# Patient Record
Sex: Male | Born: 1965 | Race: White | Hispanic: No | Marital: Married | Smoking: Current every day smoker
Health system: Southern US, Community
[De-identification: ages and names within clinical notes are randomized; demographics above are authoritative.]

## PROBLEM LIST (undated history)

## (undated) DIAGNOSIS — F4310 Post-traumatic stress disorder, unspecified: Secondary | ICD-10-CM

## (undated) DIAGNOSIS — R51 Headache: Secondary | ICD-10-CM

## (undated) DIAGNOSIS — E039 Hypothyroidism, unspecified: Secondary | ICD-10-CM

## (undated) DIAGNOSIS — I10 Essential (primary) hypertension: Secondary | ICD-10-CM

## (undated) DIAGNOSIS — K219 Gastro-esophageal reflux disease without esophagitis: Secondary | ICD-10-CM

## (undated) DIAGNOSIS — Z9889 Other specified postprocedural states: Secondary | ICD-10-CM

## (undated) DIAGNOSIS — R569 Unspecified convulsions: Secondary | ICD-10-CM

## (undated) DIAGNOSIS — R112 Nausea with vomiting, unspecified: Secondary | ICD-10-CM

## (undated) DIAGNOSIS — R748 Abnormal levels of other serum enzymes: Secondary | ICD-10-CM

## (undated) DIAGNOSIS — F419 Anxiety disorder, unspecified: Secondary | ICD-10-CM

## (undated) HISTORY — DX: Abnormal levels of other serum enzymes: R74.8

---

## 1993-03-14 HISTORY — PX: NECK SURGERY: SHX720

## 1998-03-14 HISTORY — PX: SHOULDER SURGERY: SHX246

## 2006-03-14 HISTORY — PX: DISTAL BICEPS TENDON REPAIR: SHX1461

## 2008-08-27 ENCOUNTER — Emergency Department (HOSPITAL_COMMUNITY): Admission: EM | Admit: 2008-08-27 | Discharge: 2008-08-28 | Payer: Self-pay | Admitting: Emergency Medicine

## 2011-03-24 ENCOUNTER — Encounter (HOSPITAL_COMMUNITY): Payer: Self-pay | Admitting: Pharmacy Technician

## 2011-03-28 ENCOUNTER — Other Ambulatory Visit (HOSPITAL_COMMUNITY): Payer: Self-pay | Admitting: *Deleted

## 2011-03-28 ENCOUNTER — Encounter (HOSPITAL_COMMUNITY): Payer: Self-pay

## 2011-03-28 ENCOUNTER — Encounter (HOSPITAL_COMMUNITY)
Admission: RE | Admit: 2011-03-28 | Discharge: 2011-03-28 | Disposition: A | Discharge status: Home / Self Care | Payer: Worker's Compensation | Source: Ambulatory Visit | Attending: Neurosurgery | Admitting: Neurosurgery

## 2011-03-28 HISTORY — DX: Headache: R51

## 2011-03-28 HISTORY — DX: Gastro-esophageal reflux disease without esophagitis: K21.9

## 2011-03-28 HISTORY — DX: Other specified postprocedural states: Z98.890

## 2011-03-28 HISTORY — DX: Nausea with vomiting, unspecified: R11.2

## 2011-03-28 LAB — SURGICAL PCR SCREEN: Staphylococcus aureus: NEGATIVE

## 2011-03-28 MED ORDER — VANCOMYCIN HCL 500 MG IV SOLR
500.0000 mg | INTRAVENOUS | Status: AC
  Administered 2011-03-29: 500 mg via INTRAVENOUS
  Filled 2011-03-28: qty 500

## 2011-03-28 MED ORDER — TOBRAMYCIN SULFATE 80 MG/2ML IJ SOLN
80.0000 mg | Freq: Once | INTRAVENOUS | Status: AC
  Administered 2011-03-29: 80 mg via INTRAVENOUS
  Filled 2011-03-28: qty 2

## 2011-03-28 NOTE — Pre-Procedure Instructions (Signed)
[address] [facility]  03/28/2011   Your procedure is scheduled on:  Tuesday 03/29/11   Report to [facility] at 100 PM  Call this number if you have problems the morning of surgery: [phone]   Remember:   Do not eat food:After Midnight.  May have clear liquids: up to 4 Hours before arrival.  Clear liquids include soda, tea, black coffee, apple or grape juice, broth.  Take these medicines the morning of surgery with A SIP OF WATER: ZYRTEC, NEURONTIN, PRILOSEC, ROXICODONE    Do not wear jewelry, make-up or nail polish.  Do not wear lotions, powders, or perfumes. You may wear deodorant.  Do not shave 48 hours prior to surgery.  Do not bring valuables to the hospital.  Contacts, dentures or bridgework may not be worn into surgery.  Leave suitcase in the car. After surgery it may be brought to your room.  For patients admitted to the hospital, checkout time is 11:00 AM the day of discharge.   Patients discharged the day of surgery will not be allowed to drive home.  Name and phone number of your driver:   Special Instructions: CHG Shower Use Special Wash: 1/2 bottle night before surgery and 1/2 bottle morning of surgery.   Please read over the following fact sheets that you were given: Pain Booklet, Coughing and Deep Breathing, MRSA Information and Surgical Site Infection Prevention

## 2011-03-29 ENCOUNTER — Ambulatory Visit (HOSPITAL_COMMUNITY)
Admission: RE | Admit: 2011-03-29 | Discharge: 2011-03-30 | Disposition: A | Discharge status: Home / Self Care | Payer: Worker's Compensation | Source: Ambulatory Visit | Attending: Neurosurgery | Admitting: Neurosurgery

## 2011-03-29 ENCOUNTER — Encounter (HOSPITAL_COMMUNITY)
Admission: RE | Disposition: A | Discharge status: Home / Self Care | Payer: Self-pay | Source: Ambulatory Visit | Attending: Neurosurgery

## 2011-03-29 ENCOUNTER — Encounter (HOSPITAL_COMMUNITY): Payer: Self-pay | Admitting: Anesthesiology

## 2011-03-29 ENCOUNTER — Ambulatory Visit (HOSPITAL_COMMUNITY): Payer: Worker's Compensation

## 2011-03-29 ENCOUNTER — Ambulatory Visit (HOSPITAL_COMMUNITY): Payer: Worker's Compensation | Admitting: Anesthesiology

## 2011-03-29 ENCOUNTER — Encounter (HOSPITAL_COMMUNITY): Payer: Self-pay | Admitting: *Deleted

## 2011-03-29 DIAGNOSIS — M5020 Other cervical disc displacement, unspecified cervical region: Secondary | ICD-10-CM

## 2011-03-29 DIAGNOSIS — F17200 Nicotine dependence, unspecified, uncomplicated: Secondary | ICD-10-CM | POA: Insufficient documentation

## 2011-03-29 DIAGNOSIS — K219 Gastro-esophageal reflux disease without esophagitis: Secondary | ICD-10-CM | POA: Insufficient documentation

## 2011-03-29 DIAGNOSIS — R51 Headache: Secondary | ICD-10-CM | POA: Insufficient documentation

## 2011-03-29 HISTORY — PX: ANTERIOR CERVICAL DECOMP/DISCECTOMY FUSION: SHX1161

## 2011-03-29 LAB — HEPATIC FUNCTION PANEL
ALT: 24 U/L (ref 0–53)
Alkaline Phosphatase: 104 U/L (ref 39–117)
Bilirubin, Direct: 0.1 mg/dL (ref 0.0–0.3)
Indirect Bilirubin: 0.4 mg/dL (ref 0.3–0.9)
Total Bilirubin: 0.5 mg/dL (ref 0.3–1.2)

## 2011-03-29 SURGERY — ANTERIOR CERVICAL DECOMPRESSION/DISCECTOMY FUSION 2 LEVELS
Anesthesia: General | Wound class: Clean

## 2011-03-29 MED ORDER — ONDANSETRON HCL 4 MG/2ML IJ SOLN: 4.0000 mg | INTRAMUSCULAR | Status: DC | PRN

## 2011-03-29 MED ORDER — KCL IN DEXTROSE-NACL 20-5-0.45 MEQ/L-%-% IV SOLN
80.0000 mL/h | INTRAVENOUS | Status: DC
  Administered 2011-03-29: 80 mL/h via INTRAVENOUS
  Filled 2011-03-29 (×3): qty 1000

## 2011-03-29 MED ORDER — ONDANSETRON HCL 4 MG/2ML IJ SOLN
INTRAMUSCULAR | Status: DC | PRN
  Administered 2011-03-29: 4 mg via INTRAVENOUS

## 2011-03-29 MED ORDER — OXYCODONE-ACETAMINOPHEN 5-325 MG PO TABS
1.0000 | ORAL_TABLET | ORAL | Status: DC | PRN
  Administered 2011-03-29 – 2011-03-30 (×2): 2 via ORAL
  Filled 2011-03-29 (×2): qty 2

## 2011-03-29 MED ORDER — LIDOCAINE HCL (CARDIAC) 20 MG/ML IV SOLN
INTRAVENOUS | Status: DC | PRN
  Administered 2011-03-29: 40 mg via INTRAVENOUS

## 2011-03-29 MED ORDER — PROPOFOL 10 MG/ML IV EMUL
INTRAVENOUS | Status: DC | PRN
  Administered 2011-03-29: 300 mg via INTRAVENOUS

## 2011-03-29 MED ORDER — CYCLOBENZAPRINE HCL 10 MG PO TABS
10.0000 mg | ORAL_TABLET | Freq: Three times a day (TID) | ORAL | Status: DC | PRN
  Administered 2011-03-29: 10 mg via ORAL
  Filled 2011-03-29: qty 1

## 2011-03-29 MED ORDER — VANCOMYCIN HCL 1000 MG IV SOLR
1250.0000 mg | Freq: Two times a day (BID) | INTRAVENOUS | Status: DC
  Administered 2011-03-29: 1250 mg via INTRAVENOUS
  Filled 2011-03-29 (×2): qty 1250

## 2011-03-29 MED ORDER — ACETAMINOPHEN 325 MG PO TABS: 650.0000 mg | ORAL_TABLET | ORAL | Status: DC | PRN

## 2011-03-29 MED ORDER — SODIUM CHLORIDE 0.9 % IJ SOLN: 3.0000 mL | INTRAMUSCULAR | Status: DC | PRN

## 2011-03-29 MED ORDER — PANTOPRAZOLE SODIUM 40 MG PO TBEC
40.0000 mg | DELAYED_RELEASE_TABLET | Freq: Every day | ORAL | Status: DC
  Administered 2011-03-29: 40 mg via ORAL
  Filled 2011-03-29: qty 1

## 2011-03-29 MED ORDER — LACTATED RINGERS IV SOLN
INTRAVENOUS | Status: DC | PRN
  Administered 2011-03-29 (×2): via INTRAVENOUS

## 2011-03-29 MED ORDER — NEOSTIGMINE METHYLSULFATE 1 MG/ML IJ SOLN
INTRAMUSCULAR | Status: DC | PRN
  Administered 2011-03-29: 4 mg via INTRAVENOUS

## 2011-03-29 MED ORDER — SODIUM CHLORIDE 0.9 % IJ SOLN
3.0000 mL | Freq: Two times a day (BID) | INTRAMUSCULAR | Status: DC
  Administered 2011-03-29: 3 mL via INTRAVENOUS

## 2011-03-29 MED ORDER — PHENYLEPHRINE HCL 10 MG/ML IJ SOLN
INTRAMUSCULAR | Status: DC | PRN
  Administered 2011-03-29: 40 ug via INTRAVENOUS

## 2011-03-29 MED ORDER — HYDROMORPHONE HCL PF 1 MG/ML IJ SOLN
INTRAMUSCULAR | Status: AC
  Filled 2011-03-29: qty 1

## 2011-03-29 MED ORDER — GLYCOPYRROLATE 0.2 MG/ML IJ SOLN
INTRAMUSCULAR | Status: DC | PRN
  Administered 2011-03-29: .6 mg via INTRAVENOUS

## 2011-03-29 MED ORDER — 0.9 % SODIUM CHLORIDE (POUR BTL) OPTIME
TOPICAL | Status: DC | PRN
  Administered 2011-03-29: 1000 mL

## 2011-03-29 MED ORDER — DEXAMETHASONE SODIUM PHOSPHATE 10 MG/ML IJ SOLN
10.0000 mg | Freq: Once | INTRAMUSCULAR | Status: AC
Start: 2011-03-29 — End: 2011-03-29
  Administered 2011-03-29: 10 mg via INTRAVENOUS
  Filled 2011-03-29: qty 1

## 2011-03-29 MED ORDER — DIPHENHYDRAMINE HCL 50 MG/ML IJ SOLN
50.0000 mg | Freq: Once | INTRAMUSCULAR | Status: AC
  Administered 2011-03-29: 25 mg via INTRAVENOUS
  Filled 2011-03-29: qty 1

## 2011-03-29 MED ORDER — GABAPENTIN 600 MG PO TABS
600.0000 mg | ORAL_TABLET | Freq: Three times a day (TID) | ORAL | Status: DC
  Administered 2011-03-29 – 2011-03-30 (×2): 600 mg via ORAL
  Filled 2011-03-29 (×4): qty 1

## 2011-03-29 MED ORDER — ACETAMINOPHEN 650 MG RE SUPP: 650.0000 mg | RECTAL | Status: DC | PRN

## 2011-03-29 MED ORDER — HEMOSTATIC AGENTS (NO CHARGE) OPTIME
TOPICAL | Status: DC | PRN
  Administered 2011-03-29: 1 via TOPICAL

## 2011-03-29 MED ORDER — ZOLPIDEM TARTRATE 10 MG PO TABS: 10.0000 mg | ORAL_TABLET | Freq: Every evening | ORAL | Status: DC | PRN

## 2011-03-29 MED ORDER — ACETAMINOPHEN 325 MG PO TABS
650.0000 mg | ORAL_TABLET | Freq: Once | ORAL | Status: AC
  Administered 2011-03-29: 650 mg via ORAL

## 2011-03-29 MED ORDER — MENTHOL 3 MG MT LOZG: 1.0000 | LOZENGE | OROMUCOSAL | Status: DC | PRN

## 2011-03-29 MED ORDER — DEXAMETHASONE 4 MG PO TABS
4.0000 mg | ORAL_TABLET | Freq: Four times a day (QID) | ORAL | Status: DC
  Administered 2011-03-29 – 2011-03-30 (×2): 4 mg via ORAL
  Filled 2011-03-29 (×6): qty 1

## 2011-03-29 MED ORDER — ROCURONIUM BROMIDE 100 MG/10ML IV SOLN
INTRAVENOUS | Status: DC | PRN
  Administered 2011-03-29: 50 mg via INTRAVENOUS
  Administered 2011-03-29 (×2): 20 mg via INTRAVENOUS

## 2011-03-29 MED ORDER — FENTANYL CITRATE 0.05 MG/ML IJ SOLN
INTRAMUSCULAR | Status: DC | PRN
  Administered 2011-03-29 (×2): 50 ug via INTRAVENOUS
  Administered 2011-03-29: 150 ug via INTRAVENOUS
  Administered 2011-03-29: 100 ug via INTRAVENOUS
  Administered 2011-03-29: 50 ug via INTRAVENOUS

## 2011-03-29 MED ORDER — PHENOL 1.4 % MT LIQD: 1.0000 | OROMUCOSAL | Status: DC | PRN

## 2011-03-29 MED ORDER — LORATADINE 10 MG PO TABS
10.0000 mg | ORAL_TABLET | Freq: Every day | ORAL | Status: DC
  Filled 2011-03-29 (×2): qty 1

## 2011-03-29 MED ORDER — BACITRACIN 50000 UNITS IM SOLR
INTRAMUSCULAR | Status: AC
  Filled 2011-03-29: qty 1

## 2011-03-29 MED ORDER — HYDROMORPHONE HCL PF 1 MG/ML IJ SOLN: 1.0000 mg | INTRAMUSCULAR | Status: DC | PRN

## 2011-03-29 MED ORDER — HYDROMORPHONE HCL PF 1 MG/ML IJ SOLN
0.2500 mg | INTRAMUSCULAR | Status: DC | PRN
  Administered 2011-03-29 (×4): 0.5 mg via INTRAVENOUS

## 2011-03-29 MED ORDER — SODIUM CHLORIDE 0.9 % IR SOLN
Status: DC | PRN
  Administered 2011-03-29: 14:00:00

## 2011-03-29 MED ORDER — DEXAMETHASONE SODIUM PHOSPHATE 4 MG/ML IJ SOLN
4.0000 mg | Freq: Four times a day (QID) | INTRAMUSCULAR | Status: DC
  Filled 2011-03-29 (×6): qty 1

## 2011-03-29 MED ORDER — SODIUM CHLORIDE 0.9 % IV SOLN
INTRAVENOUS | Status: AC
  Filled 2011-03-29: qty 500

## 2011-03-29 MED ORDER — ONDANSETRON HCL 4 MG/2ML IJ SOLN: 4.0000 mg | Freq: Once | INTRAMUSCULAR | Status: DC | PRN

## 2011-03-29 MED ORDER — ACETAMINOPHEN 325 MG PO TABS
ORAL_TABLET | ORAL | Status: AC
  Administered 2011-03-29: 650 mg via ORAL
  Filled 2011-03-29: qty 2

## 2011-03-29 MED ORDER — SODIUM CHLORIDE 0.9 % IV SOLN: 250.0000 mL | INTRAVENOUS | Status: DC

## 2011-03-29 MED ORDER — THROMBIN 5000 UNITS EX SOLR
CUTANEOUS | Status: DC | PRN
  Administered 2011-03-29 (×2): 5000 [IU] via TOPICAL

## 2011-03-29 SURGICAL SUPPLY — 59 items
BAG DECANTER FOR FLEXI CONT (MISCELLANEOUS) ×2 IMPLANT
BENZOIN TINCTURE PRP APPL 2/3 (GAUZE/BANDAGES/DRESSINGS) ×6 IMPLANT
BRUSH SCRUB EZ PLAIN DRY (MISCELLANEOUS) ×2 IMPLANT
CANISTER SUCTION 2500CC (MISCELLANEOUS) ×2 IMPLANT
CLOSURE STERI STRIP 1/2 X4 (GAUZE/BANDAGES/DRESSINGS) ×2 IMPLANT
CLOTH BEACON ORANGE TIMEOUT ST (SAFETY) ×2 IMPLANT
CONT SPEC 4OZ CLIKSEAL STRL BL (MISCELLANEOUS) ×2 IMPLANT
DRAIN JACKSON PRATT 1/4 1325 (MISCELLANEOUS) ×2 IMPLANT
DRAPE C-ARM 42X72 X-RAY (DRAPES) ×4 IMPLANT
DRAPE LAPAROTOMY 100X72 PEDS (DRAPES) ×2 IMPLANT
DRAPE MICROSCOPE ZEISS OPMI (DRAPES) ×2 IMPLANT
DRAPE SURG 17X23 STRL (DRAPES) ×4 IMPLANT
DRESSING TELFA 8X3 (GAUZE/BANDAGES/DRESSINGS) ×2 IMPLANT
ELECT COATED BLADE 2.86 ST (ELECTRODE) ×2 IMPLANT
ELECT REM PT RETURN 9FT ADLT (ELECTROSURGICAL) ×2
ELECTRODE REM PT RTRN 9FT ADLT (ELECTROSURGICAL) ×1 IMPLANT
EVACUATOR SILICONE 100CC (DRAIN) ×2 IMPLANT
GAUZE SPONGE 4X4 12PLY STRL LF (GAUZE/BANDAGES/DRESSINGS) ×2 IMPLANT
GAUZE SPONGE 4X4 16PLY XRAY LF (GAUZE/BANDAGES/DRESSINGS) IMPLANT
GLOVE BIOGEL PI IND STRL 7.0 (GLOVE) ×2 IMPLANT
GLOVE BIOGEL PI INDICATOR 7.0 (GLOVE) ×2
GLOVE ECLIPSE 7.5 STRL STRAW (GLOVE) ×6 IMPLANT
GLOVE EXAM NITRILE LRG STRL (GLOVE) IMPLANT
GLOVE EXAM NITRILE MD LF STRL (GLOVE) ×2 IMPLANT
GLOVE EXAM NITRILE XL STR (GLOVE) IMPLANT
GLOVE EXAM NITRILE XS STR PU (GLOVE) IMPLANT
GLOVE INDICATOR 6.5 STRL GRN (GLOVE) ×2 IMPLANT
GLOVE INDICATOR 7.0 STRL GRN (GLOVE) ×2 IMPLANT
GLOVE INDICATOR 7.5 STRL GRN (GLOVE) ×4 IMPLANT
GLOVE SS BIOGEL STRL SZ 6.5 (GLOVE) ×1 IMPLANT
GLOVE SUPERSENSE BIOGEL SZ 6.5 (GLOVE) ×1
GOWN BRE IMP SLV AUR LG STRL (GOWN DISPOSABLE) ×4 IMPLANT
GOWN BRE IMP SLV AUR XL STRL (GOWN DISPOSABLE) ×2 IMPLANT
GOWN STRL REIN 2XL LVL4 (GOWN DISPOSABLE) ×4 IMPLANT
HEAD HALTER (SOFTGOODS) ×2 IMPLANT
INTERBODY TM 11X14X7-0DEG PAR (Metal Cage) ×2 IMPLANT
INTERBODY TM 11X14X9-7DEG ANG (Metal Cage) ×2 IMPLANT
INVIZIA DRILL BIT ×2 IMPLANT
KIT BASIN OR (CUSTOM PROCEDURE TRAY) ×2 IMPLANT
KIT ROOM TURNOVER OR (KITS) ×2 IMPLANT
NEEDLE SPNL 20GX3.5 QUINCKE YW (NEEDLE) ×2 IMPLANT
NS IRRIG 1000ML POUR BTL (IV SOLUTION) ×2 IMPLANT
PACK LAMINECTOMY NEURO (CUSTOM PROCEDURE TRAY) ×2 IMPLANT
PAD ARMBOARD 7.5X6 YLW CONV (MISCELLANEOUS) ×2 IMPLANT
PATTIES SURGICAL .75X.75 (GAUZE/BANDAGES/DRESSINGS) ×2 IMPLANT
PUTTY BONE GRAFT KIT 2.5ML (Bone Implant) ×2 IMPLANT
RUBBERBAND STERILE (MISCELLANEOUS) ×4 IMPLANT
SPONGE GAUZE 4X4 12PLY (GAUZE/BANDAGES/DRESSINGS) ×2 IMPLANT
SPONGE INTESTINAL PEANUT (DISPOSABLE) ×2 IMPLANT
SPONGE SURGIFOAM ABS GEL SZ50 (HEMOSTASIS) ×2 IMPLANT
STRIP CLOSURE SKIN 1/2X4 (GAUZE/BANDAGES/DRESSINGS) ×2 IMPLANT
SUT PDS AB 5-0 P3 18 (SUTURE) ×2 IMPLANT
SUT VIC AB 3-0 CP2 18 (SUTURE) ×2 IMPLANT
SYR 20ML ECCENTRIC (SYRINGE) ×2 IMPLANT
TOOL MATCHSTK 3MM (MISCELLANEOUS) ×2 IMPLANT
TOWEL OR 17X24 6PK STRL BLUE (TOWEL DISPOSABLE) ×2 IMPLANT
TOWEL OR 17X26 10 PK STRL BLUE (TOWEL DISPOSABLE) ×2 IMPLANT
TRAP SPECIMEN MUCOUS 40CC (MISCELLANEOUS) ×2 IMPLANT
WATER STERILE IRR 1000ML POUR (IV SOLUTION) ×2 IMPLANT

## 2011-03-29 NOTE — H&P (Signed)
Manuel Pearson is an 46 y.o. male.   Chief Complaint: Neck and right arm pain HPI: The patient is a 46 year old gentleman who had an anterior cervical discectomy at C5-6 many years ago. He did well until recently when he suffered a work injury and was noted to have abnormalities at C4-5 and C6-C7 on the right. He tried conservative therapy without improvement. After discussing the options the patient requested surgical intervention and is now admitted for a C4-5 and C6-7 anterior cervical discectomy with fusion and plating. I had a long discussion with the patient regarding the risks and benefits of surgical intervention. The risks discussed include but are not limited to bleeding infection weakness numbness paralysis hoarseness coma and death. We've discussed alternative methods of therapy along with the risks and benefits of nonintervention. The patient has had the opportunity to ask numerous questions and appears to understand. With this information in hand he has requested that we proceed with surgery.  Past Medical History  Diagnosis Date  . PONV (postoperative nausea and vomiting)     DIFFICULTY WAKING UP   . Hepatitis     07/2011 LIVER ENZYMES UP CAME DOWN TO NORMAL ONCE MEDS CHANGED  . GERD (gastroesophageal reflux disease)   . Headache     MIGRAINES     Past Surgical History  Procedure Date  . Neck surgery 1995    FUSION   . Shoulder surgery 2000    RIGHT     LEFT  2004   . Distal biceps tendon repair 2008    LEFT     History reviewed. No pertinent family history. Social History:  reports that he has been smoking.  He does not have any smokeless tobacco history on file. He reports that he drinks alcohol. He reports that he does not use illicit drugs.  Allergies: No Known Allergies  Medications Prior to Admission  Medication Dose Route Frequency Provider Last Rate Last Dose  . acetaminophen (TYLENOL) tablet 650 mg  650 mg Oral Once Neale Burly. Zelenak   650 mg at 03/29/11 1254    . dexamethasone (DECADRON) injection 10 mg  10 mg Intravenous Once [employer]      . diphenhydrAMINE (BENADRYL) injection 50 mg  50 mg Intravenous Once Yahoo      . HYDROmorphone (DILAUDID) injection 0.25-0.5 mg  0.25-0.5 mg Intravenous Q5 min PRN Rivka Barbara, MD      . ondansetron [facility]) injection 4 mg  4 mg Intravenous Once PRN Rivka Barbara, MD      . tobramycin (NEBCIN) 80 mg in dextrose 5 % 50 mL IVPB  80 mg Intravenous Once Yahoo      . vancomycin (VANCOCIN) 500 mg in sodium chloride 0.9 % 100 mL IVPB  500 mg Intravenous 120 min pre-op Rolanda Lundborg Sabreena Vogan       No current outpatient prescriptions on file as of 03/29/2011.    Results for orders placed during the hospital encounter of 03/29/11 (from the past 48 hour(s))  HEPATIC FUNCTION PANEL     Status: Normal   Collection Time   03/29/11 12:40 PM      Component Value Range Comment   Total Protein 7.0  6.0 - 8.3 (g/dL)    Albumin 3.6  3.5 - 5.2 (g/dL)    AST 19  0 - 37 (U/L)    ALT 24  0 - 53 (U/L)    Alkaline Phosphatase 104  39 - 117 (U/L)  Total Bilirubin 0.5  0.3 - 1.2 (mg/dL)    Bilirubin, Direct 0.1  0.0 - 0.3 (mg/dL)    Indirect Bilirubin 0.4  0.3 - 0.9 (mg/dL)    No results found.  Pertinent items are noted in HPI.  Blood pressure 145/93, temperature 98.2 F (36.8 C), temperature source Oral, resp. rate 20, SpO2 97.00%.  Neurologically intact Assessment/Plan Impression is that of abnormality on the right at C4-5 and C6-7. The plan is for to separate one level anterior cervical discectomies with fusion and plating.  Reinaldo Meeker, MD 03/29/2011, 2:01 PM

## 2011-03-29 NOTE — Anesthesia Preprocedure Evaluation (Signed)
Anesthesia Evaluation  Patient identified by MRN, date of birth, ID band Patient awake    Reviewed: Allergy & Precautions, H&P , NPO status , Patient's Chart, lab work & pertinent test results  Airway Mallampati: II TM Distance: >3 FB Neck ROM: full    Dental  (+) Teeth Intact   Pulmonary Current Smoker,    Pulmonary exam normal       Cardiovascular Exercise Tolerance: Good neg cardio ROS regular Normal    Neuro/Psych  Headaches, Negative Psych ROS   GI/Hepatic GERD-  ,  Endo/Other  Negative Endocrine ROS  Renal/GU negative Renal ROS  Genitourinary negative   Musculoskeletal   Abdominal Normal abdominal exam  (+)   Peds  Hematology   Anesthesia Other Findings   Reproductive/Obstetrics                           Anesthesia Physical Anesthesia Plan  ASA: III  Anesthesia Plan: General ETT   Post-op Pain Management:    Induction: Intravenous  Airway Management Planned: Oral ETT  Additional Equipment:   Intra-op Plan:   Post-operative Plan:   Informed Consent: I have reviewed the patients History and Physical, chart, labs and discussed the procedure including the risks, benefits and alternatives for the proposed anesthesia with the patient or authorized representative who has indicated his/her understanding and acceptance.     Plan Discussed with: Anesthesiologist, CRNA and Surgeon  Anesthesia Plan Comments:         Anesthesia Quick Evaluation

## 2011-03-29 NOTE — Transfer of Care (Signed)
Immediate Anesthesia Transfer of Care Note  Patient: Manuel Pearson [facility]  Procedure(s) Performed:  ANTERIOR CERVICAL DECOMPRESSION/DISCECTOMY FUSION 2 LEVELS - CERVICAL FOUR-FIVE, CERVICAL SIX-SEVEN  ANTERIOR CERVICAL DECOMPRESSION FUSION  WITH TRABECULAR METAL  AND PLATE  Patient Location: PACU  Anesthesia Type: General  Level of Consciousness: awake and patient cooperative  Airway & Oxygen Therapy: Patient Spontanous Breathing and Patient connected to face mask oxygen  Post-op Assessment: Report given to PACU RN  Post vital signs: Reviewed and stable Filed Vitals:   03/29/11 1143  BP: 145/93  Temp: 36.8 C  Resp: 20    Complications: No apparent anesthesia complications

## 2011-03-29 NOTE — Brief Op Note (Signed)
03/29/2011  5:25 PM  PATIENT:  Manuel Pearson  46 y.o. male  PRE-OPERATIVE DIAGNOSIS:  Herniated Nucleus Pulposus cervical four-five ,six-seven   POST-OPERATIVE DIAGNOSIS:  Herniated Nucleus Pulposus cervical four-five ,six-seven   PROCEDURE:  Procedure(s): ANTERIOR CERVICAL DECOMPRESSION/DISCECTOMY FUSION 2 LEVELS  SURGEON:  Surgeon(s): Quierra Silverio O Blakleigh Straw Shary Key Elsner Clydene Fake  PHYSICIAN ASSISTANT:   ASSISTANTSPhoebe Perch   ANESTHESIA:   general  EBL:  Total I/O In: 1000 [I.V.:1000] Out: 200 [Blood:200]  BLOOD ADMINISTERED:none  DRAINS: (7mm) Jackson-Pratt drain(s) with closed bulb suction in the prevertebral   LOCAL MEDICATIONS USED:  NONE  SPECIMEN:  No Specimen  DISPOSITION OF SPECIMEN:  N/A  COUNTS:  YES  TOURNIQUET:  * No tourniquets in log *  DICTATION: .Dragon Dictation  PLAN OF CARE: Admit for overnight observation  PATIENT DISPOSITION:  PACU - hemodynamically stable.   Delay start of Pharmacological VTE agent (>24hrs) due to surgical blood loss or risk of bleeding:  {YES/NO/NOT APPLICABLE:20182

## 2011-03-29 NOTE — Progress Notes (Signed)
ANTIBIOTIC CONSULT NOTE - INITIAL  Pharmacy Consult for Vancomycin Indication: post op prophylaxis  No Known Allergies  Patient Measurements:     Vital Signs: Temp: 99.3 F (37.4 C) (01/15 1900) Temp src: Oral (01/15 1143) BP: 164/93 mmHg (01/15 1900) Pulse Rate: 109  (01/15 1900) Intake/Output from previous day:   Intake/Output from this shift: Total I/O In: -  Out: 150 [Urine:150]  Labs: No results found for this basename: WBC:3,HGB:3,PLT:3,LABCREA:3,CREATININE:3 in the last 72 hours CrCl is unknown because no creatinine reading has been taken and the patient has no height on file. No results found for this basename: VANCOTROUGH:2,VANCOPEAK:2,VANCORANDOM:2,GENTTROUGH:2,GENTPEAK:2,GENTRANDOM:2,TOBRATROUGH:2,TOBRAPEAK:2,TOBRARND:2,AMIKACINPEAK:2,AMIKACINTROU:2,AMIKACIN:2, in the last 72 hours   Microbiology: Recent Results (from the past 720 hour(s))  SURGICAL PCR SCREEN     Status: Normal   Collection Time   03/28/11  2:14 PM      Component Value Range Status Comment   MRSA, PCR NEGATIVE  NEGATIVE  Final    Staphylococcus aureus NEGATIVE  NEGATIVE  Final     Medical History: Past Medical History  Diagnosis Date  . PONV (postoperative nausea and vomiting)     DIFFICULTY WAKING UP   . Hepatitis     07/2011 LIVER ENZYMES UP CAME DOWN TO NORMAL ONCE MEDS CHANGED  . GERD (gastroesophageal reflux disease)   . Headache     MIGRAINES     Medications:  Scheduled:    . acetaminophen  650 mg Oral Once  . bacitracin      . dexamethasone  10 mg Intravenous Once  . dexamethasone  4 mg Intravenous Q6H   Or  . dexamethasone  4 mg Oral Q6H  . diphenhydrAMINE  50 mg Intravenous Once  . gabapentin  600 mg Oral TID  . HYDROmorphone      . HYDROmorphone      . loratadine  10 mg Oral Daily  . pantoprazole  40 mg Oral Q1200  . sodium chloride      . sodium chloride  3 mL Intravenous Q12H  . tobramycin  80 mg Intravenous Once  . vancomycin  500 mg Intravenous 120 min  pre-op   Assessment: Pt s/p C4-5; C6-7 anterior cervical discectomy today for post-op prophylaxis.  Pt with cervical drain in place therefore will continue vancomycin >24hrs post-op.  Rec'd Vancomycin 500mg  IV pre-op at 1430.    Goal of Therapy:  Vancomycin trough level 10-15 mcg/ml  Plan:  1) Vancomycin 1250mg  IV q12h 2) F/U renal function & adjust dose as needed. 3) Check vancomycin trough at steady state if plan to continue > 5 days.   Elson Clan 03/29/2011,9:24 PM

## 2011-03-29 NOTE — Anesthesia Procedure Notes (Signed)
Procedure Name: Intubation Date/Time: 03/29/2011 2:31 PM Performed by: Elizbeth Squires Pre-anesthesia Checklist: Patient identified, Emergency Drugs available, Suction available and Patient being monitored Patient Re-evaluated:Patient Re-evaluated prior to inductionOxygen Delivery Method: Circle System Utilized Preoxygenation: Pre-oxygenation with 100% oxygen Intubation Type: IV induction Ventilation: Mask ventilation without difficulty Laryngoscope Size: Mac and 4 Grade View: Grade I Tube type: Oral Tube size: 8.0 mm Number of attempts: 1 Airway Equipment and Method: stylet Placement Confirmation: ETT inserted through vocal cords under direct vision,  positive ETCO2 and breath sounds checked- equal and bilateral Secured at: 24 cm Tube secured with: Tape Dental Injury: Teeth and Oropharynx as per pre-operative assessment

## 2011-03-29 NOTE — Op Note (Signed)
Preoperative diagnosis: Herniated disc C4-5 C6-7 Postop diagnosis: Same Procedure: C4-5 C6-7 anterior cervical discectomy with trabecular metal fusion followed by Sherlene Shams anterior cervical plating Surgeon: Shenise Wolgamott Assist: Phoebe Perch  And placed in supine position and 10 pounds halter traction the patient's neck was prepped and draped in the usual sterile fashion. Localizing fluoroscopy was used prior to incision to identify the entry incision. Incision was made in the right anterior neck started the midline and headed towards the medial aspect of the sternocleidomastoid muscle. The platysma muscle was then incised transversely and the natural fascial plane between the strap muscles medially and the sternocleidomastoid laterally was identified and followed down to the interest of the cervical spine. Longus coli muscles were identified split in the midline and stripped away bilaterally with Kitner dissector and unipolar coagulation. We initially placed a retractor to the C4-5 level this was confirmed with fluoroscopy. This was incised a 15 blade and using a variety of pituitary rongeurs and curettes it was cleaned out proximally 90%. High-speed drill was used to widen the interspace and bony shavings were saved for use later in the case. At this time the microscope was draped brought into the field and used for the remainder of the case. Using microdissection technique the remainder of the disc material down to the posterior longitudinal ligament was removed. Ligament was incised transversely and the cut edges were removed with the Kerrison punch. Thorough spinal decompression was carried out as well as proximal foraminal decompression bilaterally. C5 nerve roots will were well visualized and well decompressed at this time. Irrigation was carried out any bleeding controlled with upper coagulation and Gelfoam. Measurements were taken and a 7 mm trabecular metal graft was chosen. It was filled with a mixture of  autologous bone and morselized allograft. It was then impacted into the and fluoroscopy showed to be in good position. An appropriately length in VISI a anterior plate was then chosen. Drill holes were plate followed by placing of 14 mm screws x4. Locking mechanism was rotated into the locked the and final fluoroscopy this level showed good position of the plates screws and plugs. Attention was then turned to C6-7 once again the disc space was incised. This was enlarged with high-speed drill the proximal and 90% of the disc material was removed at this time. Microscope was then used once again and the remainder of the disc material down to the posterior longitudinal ligament was removed without difficulty. Ligament was then incised transversely and the cut edges once again removed with a punch. Thorough foraminal decompression was carried out as well as decompression of the spinal dura. At this time inspection was carried out at this level for any evidence of residual compression and none could be identified. Irrigation was carried out any bleeding control proper coagulation Gelfoam. Measurements were taken and a 9 mm lordotic graft was chosen and filled with a mixture of autologous bone and morselized allograft. The graft was then impacted and the space without difficulty. An appropriate length anterior cervical plate was then chosen and inferior poles were placed with placement of 14 mm screws x4. The mechanism was rotated into the locked position and final fluoroscopy showed good placed of the screws plates and plugs at both levels. Irrigation was carried out and a prevertebral drain was left in the prevertebral space. He was brought out through a separate stab incision. The wound then closed with inverted Vicryl on the platysma muscle and inverted 5-0 PDS in the subcuticular layer. Steri-Strips were then applied  along with a soft collar and the patient was extubated and taken to recovery room in stable  condition.

## 2011-03-29 NOTE — Preoperative (Signed)
Beta Blockers   Reason not to administer Beta Blockers:Not prescribed 

## 2011-03-30 LAB — BASIC METABOLIC PANEL
BUN: 11 mg/dL (ref 6–23)
Calcium: 9.7 mg/dL (ref 8.4–10.5)
Creatinine, Ser: 0.82 mg/dL (ref 0.50–1.35)
GFR calc Af Amer: >90 mL/min (ref >90)
GFR calc non Af Amer: >90 mL/min (ref >90)

## 2011-03-30 MED ORDER — VANCOMYCIN HCL IN DEXTROSE 1-5 GM/200ML-% IV SOLN
1000.0000 mg | Freq: Three times a day (TID) | INTRAVENOUS | Status: DC
  Filled 2011-03-30 (×2): qty 200

## 2011-03-30 NOTE — Anesthesia Postprocedure Evaluation (Signed)
  Anesthesia Post-op Note  Patient: Edan Juday [facility]  Procedure(s) Performed:  ANTERIOR CERVICAL DECOMPRESSION/DISCECTOMY FUSION 2 LEVELS - CERVICAL FOUR-FIVE, CERVICAL SIX-SEVEN  ANTERIOR CERVICAL DECOMPRESSION FUSION  WITH TRABECULAR METAL  AND PLATE  Patient Location: PACU  Anesthesia Type: General  Level of Consciousness: awake, oriented and patient cooperative  Airway and Oxygen Therapy: Patient Spontanous Breathing and Patient connected to nasal cannula oxygen  Post-op Pain: mild  Post-op Assessment: Post-op Vital signs reviewed, Patient's Cardiovascular Status Stable, Respiratory Function Stable, Patent Airway, No signs of Nausea or vomiting and Pain level controlled  Post-op Vital Signs: stable  Complications: No apparent anesthesia complications

## 2011-03-30 NOTE — Progress Notes (Signed)
Patient had an uneventful night. C/O of minimal pain to the surgical site. PRN med controlled pain well. Emptied 18ml from JP drain but drain stop functioning around 0645. It appears that an air leak may be present under the dressing. Dressing dry and intact and patient c/o of no pain at this time. Will pass off to am nurse for further evaluation.

## 2011-03-30 NOTE — Progress Notes (Signed)
Pharmacy: Vancomycin  45yom s/p C4-5; C6-7 anterior cervical discectomy with cervical drain on day #2 of vancomycin for post-op prophylaxis. Follow up sCr is 0.82, CrCl 125 ml/min.  Given weight and renal function, will change dose to 1g q8.

## 2011-03-31 ENCOUNTER — Encounter (HOSPITAL_COMMUNITY): Payer: Self-pay | Admitting: Neurosurgery

## 2011-04-18 ENCOUNTER — Other Ambulatory Visit: Payer: Self-pay | Admitting: Neurosurgery

## 2011-04-18 ENCOUNTER — Ambulatory Visit
Admission: RE | Admit: 2011-04-18 | Discharge: 2011-04-18 | Disposition: A | Discharge status: Home / Self Care | Payer: Worker's Compensation | Source: Ambulatory Visit | Attending: Neurosurgery | Admitting: Neurosurgery

## 2011-04-18 DIAGNOSIS — M542 Cervicalgia: Secondary | ICD-10-CM

## 2012-07-05 ENCOUNTER — Emergency Department (HOSPITAL_COMMUNITY)
Admission: EM | Admit: 2012-07-05 | Discharge: 2012-07-06 | Disposition: A | Discharge status: Home / Self Care | Payer: Worker's Compensation | Attending: Emergency Medicine | Admitting: Emergency Medicine

## 2012-07-05 ENCOUNTER — Emergency Department (HOSPITAL_COMMUNITY): Payer: Worker's Compensation

## 2012-07-05 ENCOUNTER — Encounter (HOSPITAL_COMMUNITY): Payer: Self-pay | Admitting: Emergency Medicine

## 2012-07-05 DIAGNOSIS — F17200 Nicotine dependence, unspecified, uncomplicated: Secondary | ICD-10-CM | POA: Insufficient documentation

## 2012-07-05 DIAGNOSIS — K219 Gastro-esophageal reflux disease without esophagitis: Secondary | ICD-10-CM | POA: Insufficient documentation

## 2012-07-05 DIAGNOSIS — Z79899 Other long term (current) drug therapy: Secondary | ICD-10-CM | POA: Insufficient documentation

## 2012-07-05 DIAGNOSIS — Z9889 Other specified postprocedural states: Secondary | ICD-10-CM

## 2012-07-05 DIAGNOSIS — X503XXA Overexertion from repetitive movements, initial encounter: Secondary | ICD-10-CM | POA: Insufficient documentation

## 2012-07-05 DIAGNOSIS — Z8719 Personal history of other diseases of the digestive system: Secondary | ICD-10-CM | POA: Insufficient documentation

## 2012-07-05 DIAGNOSIS — M542 Cervicalgia: Secondary | ICD-10-CM

## 2012-07-05 DIAGNOSIS — S0993XA Unspecified injury of face, initial encounter: Secondary | ICD-10-CM | POA: Insufficient documentation

## 2012-07-05 DIAGNOSIS — X500XXA Overexertion from strenuous movement or load, initial encounter: Secondary | ICD-10-CM | POA: Insufficient documentation

## 2012-07-05 DIAGNOSIS — M436 Torticollis: Secondary | ICD-10-CM

## 2012-07-05 DIAGNOSIS — Y929 Unspecified place or not applicable: Secondary | ICD-10-CM | POA: Insufficient documentation

## 2012-07-05 DIAGNOSIS — Y939 Activity, unspecified: Secondary | ICD-10-CM | POA: Insufficient documentation

## 2012-07-05 DIAGNOSIS — M62838 Other muscle spasm: Secondary | ICD-10-CM | POA: Insufficient documentation

## 2012-07-05 NOTE — ED Notes (Signed)
PT. 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

## 2012-07-06 ENCOUNTER — Emergency Department (HOSPITAL_COMMUNITY): Payer: Worker's Compensation

## 2012-07-06 MED ORDER — NAPROXEN 500 MG PO TABS: 500.0000 mg | ORAL_TABLET | Freq: Two times a day (BID) | ORAL | Status: DC

## 2012-07-06 MED ORDER — DIAZEPAM 5 MG PO TABS: 5.0000 mg | ORAL_TABLET | Freq: Two times a day (BID) | ORAL | Status: DC

## 2012-07-06 MED ORDER — HYDROMORPHONE HCL PF 1 MG/ML IJ SOLN
1.0000 mg | INTRAMUSCULAR | Status: AC
  Administered 2012-07-06: 1 mg via SUBCUTANEOUS
  Filled 2012-07-06: qty 1

## 2012-07-06 MED ORDER — OXYCODONE-ACETAMINOPHEN 5-325 MG PO TABS: 1.0000 | ORAL_TABLET | ORAL | Status: DC | PRN

## 2012-07-06 MED ORDER — NAPROXEN 250 MG PO TABS
500.0000 mg | ORAL_TABLET | Freq: Once | ORAL | Status: AC
  Administered 2012-07-06: 500 mg via ORAL
  Filled 2012-07-06: qty 2

## 2012-07-06 MED ORDER — HYDROCODONE-ACETAMINOPHEN 5-325 MG PO TABS
2.0000 | ORAL_TABLET | Freq: Once | ORAL | Status: AC
  Administered 2012-07-06: 2 via ORAL
  Filled 2012-07-06: qty 2

## 2012-07-06 MED ORDER — DIAZEPAM 5 MG PO TABS
5.0000 mg | ORAL_TABLET | Freq: Once | ORAL | Status: AC
  Administered 2012-07-06: 5 mg via ORAL
  Filled 2012-07-06: qty 1

## 2012-07-06 NOTE — ED Provider Notes (Signed)
History     CSN: [id number]  Arrival date & time 07/05/12  2100   First MD Initiated Contact with Patient 07/06/12 0001      Chief Complaint  Patient presents with  . Neck Injury   HPI Manuel Pearson is a 47 y.o. male history of neck problems including most recently a fusion on 03/29/2011 by Dr. Gerlene Fee: C4-5 C6-7 anterior cervical discectomy with trabecular metal fusion followed by Invisia anterior cervical plating. 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 This transiently passed, and then as he was making another pulling movement on a different part of the truck he had recurrence of the same pain it was severe, constant, not associated with any weakness, some associated tingling of the right fingertips which has since resolved. Pain has resolved somewhat slightly, he has not taken anything for it. He did not fall, did not hit his head, did not injure his neck via trauma.    Past Medical History  Diagnosis Date  . PONV (postoperative nausea and vomiting)     DIFFICULTY WAKING UP   . Hepatitis     07/2011 LIVER ENZYMES UP CAME DOWN TO NORMAL ONCE MEDS CHANGED  . GERD (gastroesophageal reflux disease)   . Headache     MIGRAINES     Past Surgical History  Procedure Laterality Date  . Neck surgery  1995    FUSION   . Shoulder surgery  2000    RIGHT     LEFT  2004   . Distal biceps tendon repair  2008    LEFT   . Anterior cervical decomp/discectomy fusion  03/29/2011    Procedure: ANTERIOR CERVICAL DECOMPRESSION/DISCECTOMY FUSION 2 LEVELS;  Surgeon: Reinaldo Meeker, MD;  Location: [facility];  Service: Neurosurgery;  Laterality: N/A;  CERVICAL FOUR-FIVE, CERVICAL SIX-SEVEN  ANTERIOR CERVICAL DECOMPRESSION FUSION  WITH TRABECULAR METAL  AND PLATE    No family history on  file.  History  Substance Use Topics  . Smoking status: Current Every Day Smoker -- 0.50 packs/day  . Smokeless tobacco: Not on file  . Alcohol Use: Yes     Comment: RARELY       Review of Systems At least 10pt or greater review of systems completed and are negative except where specified in the HPI.  Allergies  Review of patient's allergies indicates no known allergies.  Home Medications   Current Outpatient Rx  Name  Route  Sig  Dispense  Refill  . cetirizine (ZYRTEC ALLERGY) 10 MG tablet   Oral   Take 10 mg by mouth at bedtime.         . gabapentin (NEURONTIN) 600 MG tablet   Oral   Take 600 mg by mouth 3 (three) times daily.         . Multiple Vitamin (MULITIVITAMIN WITH MINERALS) TABS   Oral   Take 1 tablet by mouth daily.         Marland Kitchen omeprazole (PRILOSEC) 20 MG capsule   Oral   Take 20 mg by mouth daily.         Marland Kitchen oxyCODONE (ROXICODONE) 15 MG immediate release tablet   Oral   Take 15 mg by mouth 3 (three) times daily.  BP 178/102  Pulse 80  Temp(Src) 99.1 F (37.3 C) (Oral)  Resp 14  SpO2 97%  Physical Exam  PHYSICAL EXAM: VITAL SIGNS:  . Filed Vitals:   07/05/12 2112  BP: 178/102  Pulse: 80  Temp: 99.1 F (37.3 C)  TempSrc: Oral  Resp: 14  SpO2: 97%   CONSTITUTIONAL: Awake, oriented, appears non-toxic HENT: Atraumatic, normocephalic, oral mucosa pink and moist, airway patent. Nares patent without drainage. External ears normal. EYES: Conjunctiva clear, EOMI, PERRLA NECK: Trachea midline, non-tender, supple CARDIOVASCULAR: Normal heart rate, Normal rhythm, No murmurs, rubs, gallops PULMONARY/CHEST: Clear to auscultation, no rhonchi, wheezes, or rales. Symmetrical breath sounds. CHEST WALL: No lesions. Non-tender. ABDOMINAL: Non-distended, soft, non-tender - no rebound or guarding.  BS normal. NEUROLOGIC: WU:[id number] fields intact. PERRLA, EOMI.  Facial sensation equal to light touch bilaterally.  Good muscle bulk in the  masseter muscle and good lateral movement of the jaw.  Facial expressions equal and good strength with smile/frown and puffed cheeks.  Hearing grossly intact to finger rub test.  Uvula, tongue are midline with no deviation. Symmetrical palate elevation.  Trapezius and SCM muscles are 5/5 strength bilaterally.   DTR: Brachioradialis, biceps, patellar, Achilles tendon reflexes 2+ bilaterally.  No clonus. Strength: 5/5 strength flexors and extensors in the upper and lower extremities.  Grip strength, finger adduction/abduction 5/5. Sensation: Sensation intact distally to light touch, proprioception using position testing of 2nd digit and great toe Cerebellar: No ataxia with walking or dysmetria with finger to nose, rapid alternating hand movements and heels to shin testing. Gait and Station: Normal heel/toe, and tandem gait.  Negative Romberg, no pronator drift EXTREMITIES: No clubbing, cyanosis, or edema SKIN: Warm, Dry, No erythema, No rash   ED Course  Procedures (including critical care time)  Labs Reviewed - No data to display Dg Cervical Spine Complete  07/05/2012  *RADIOLOGY REPORT*  Clinical Data: Pain.  Lifting today.  CERVICAL SPINE - COMPLETE 4+ VIEW  Comparison:  04/18/2011  Findings: The lateral images through the mid C7 level. Prevertebral soft tissues are within normal limits.  Anterior plate and screw fixation at C4-C5 and C6-C7.  Similar configuration to on the prior exam.  Straightening of the cervical lordosis, likely postsurgical. No acute hardware complication.  Facets are well-aligned.  Neural foraminal narrowing bilaterally.  On the right at C6-C7.  On the left at C4-C5 and C6-C7.  Cervical collar in place.  Odontoid process tip partially obscured.  Swimmer's view images the cervical thoracic junction moderately well.  IMPRESSION: Similar appearance of anterior fixation at C4-C5 and C6-C7, without acute hardware complication.  Spondylosis, with areas of bilateral neural foraminal  narrowing.   Original Report Authenticated By: Jeronimo Greaves, M.D.      1. Trapezius muscle spasm   2. Neck pain   3. H/O neck surgery   4. Torticollis, acute       MDM  Patient with cervical spine fusion presents with likely musculoskeletal pain however he has difficulty with range of motion. X-ray series was ordered from triage, based on patient's prior hardware and injuries we'll obtain a CT of the neck to further characterize any smaller injury not seen on cervical spine XR.  Pain treated aggressively.  Pain managed well, CT's negative. Pt likely with torticollis/spasm of trapezius.  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  Pt will require time off from  work for spasm to resolve.  No neurological emergency at this time.  I explained the diagnosis and have given explicit precautions to return to the ER including numbness, weakness or any other new or worsening symptoms. The patient understands and accepts the medical plan as it's been dictated and I have answered their questions. Discharge instructions concerning home care and prescriptions have been given.  The patient is STABLE and is discharged to home in good condition.         Jones Skene, MD 07/09/12 1208

## 2012-07-06 NOTE — ED Notes (Signed)
Patient transported to CT 

## 2013-05-24 ENCOUNTER — Emergency Department (INDEPENDENT_AMBULATORY_CARE_PROVIDER_SITE_OTHER)
Admission: EM | Admit: 2013-05-24 | Discharge: 2013-05-24 | Disposition: A | Discharge status: Home / Self Care | Payer: Self-pay | Source: Home / Self Care | Attending: Family Medicine | Admitting: Family Medicine

## 2013-05-24 ENCOUNTER — Encounter (HOSPITAL_COMMUNITY): Payer: Self-pay | Admitting: Emergency Medicine

## 2013-05-24 ENCOUNTER — Other Ambulatory Visit (HOSPITAL_COMMUNITY)
Admission: RE | Admit: 2013-05-24 | Discharge: 2013-05-24 | Disposition: A | Discharge status: Home / Self Care | Payer: Self-pay | Source: Ambulatory Visit | Attending: Emergency Medicine | Admitting: Emergency Medicine

## 2013-05-24 DIAGNOSIS — Z113 Encounter for screening for infections with a predominantly sexual mode of transmission: Secondary | ICD-10-CM | POA: Insufficient documentation

## 2013-05-24 DIAGNOSIS — N390 Urinary tract infection, site not specified: Secondary | ICD-10-CM

## 2013-05-24 LAB — POCT URINALYSIS DIP (DEVICE)
Bilirubin Urine: NEGATIVE
Glucose, UA: NEGATIVE mg/dL
KETONES UR: NEGATIVE mg/dL
NITRITE: NEGATIVE
PH: 6 (ref 5.0–8.0)
PROTEIN: NEGATIVE mg/dL
Specific Gravity, Urine: <=1.005 (ref 1.005–1.030)
UROBILINOGEN UA: 0.2 mg/dL (ref 0.0–1.0)

## 2013-05-24 MED ORDER — CIPROFLOXACIN HCL 500 MG PO TABS: 500.0000 mg | ORAL_TABLET | Freq: Two times a day (BID) | ORAL | Status: DC

## 2013-05-24 NOTE — Discharge Instructions (Signed)
Urinary Tract Infection  Urinary tract infections (UTIs) can develop anywhere along your urinary tract. Your urinary tract is your body's drainage system for removing wastes and extra water. Your urinary tract includes two kidneys, two ureters, a bladder, and a urethra. Your kidneys are a pair of bean-shaped organs. Each kidney is about the size of your fist. They are located below your ribs, one on each side of your spine.  CAUSES  Infections are caused by microbes, which are microscopic organisms, including fungi, viruses, and bacteria. These organisms are so small that they can only be seen through a microscope. Bacteria are the microbes that most commonly cause UTIs.  SYMPTOMS   Symptoms of UTIs may vary by age and gender of the patient and by the location of the infection. Symptoms in young women typically include a frequent and intense urge to urinate and a painful, burning feeling in the bladder or urethra during urination. Older women and men are more likely to be tired, shaky, and weak and have muscle aches and abdominal pain. A fever may mean the infection is in your kidneys. Other symptoms of a kidney infection include pain in your back or sides below the ribs, nausea, and vomiting.  DIAGNOSIS  To diagnose a UTI, your caregiver will ask you about your symptoms. Your caregiver also will ask to provide a urine sample. The urine sample will be tested for bacteria and white blood cells. White blood cells are made by your body to help fight infection.  TREATMENT   Typically, UTIs can be treated with medication. Because most UTIs are caused by a bacterial infection, they usually can be treated with the use of antibiotics. The choice of antibiotic and length of treatment depend on your symptoms and the type of bacteria causing your infection.  HOME CARE INSTRUCTIONS   If you were prescribed antibiotics, take them exactly as your caregiver instructs you. Finish the medication even if you feel better after you  have only taken some of the medication.   Drink enough water and fluids to keep your urine clear or pale yellow.   Avoid caffeine, tea, and carbonated beverages. They tend to irritate your bladder.   Empty your bladder often. Avoid holding urine for long periods of time.   Empty your bladder before and after sexual intercourse.   After a bowel movement, women should cleanse from front to back. Use each tissue only once.  SEEK MEDICAL CARE IF:    You have back pain.   You develop a fever.   Your symptoms do not begin to resolve within 3 days.  SEEK IMMEDIATE MEDICAL CARE IF:    You have severe back pain or lower abdominal pain.   You develop chills.   You have nausea or vomiting.   You have continued burning or discomfort with urination.  MAKE SURE YOU:    Understand these instructions.   Will watch your condition.   Will get help right away if you are not doing well or get worse.  Document Released: 12/08/2004 Document Revised: 08/30/2011 Document Reviewed: 04/08/2011  [employer] Patient Information 2014 [employer]

## 2013-05-24 NOTE — ED Notes (Signed)
C/o 2 week duration of pain in lower back pain, cloudy UA, urgency, feels as if still needs to empty after having been to void. NAD

## 2013-05-24 NOTE — ED Provider Notes (Signed)
CSN: [id number]     Arrival date & time 05/24/13  0800 History   None    Chief Complaint  Patient presents with  . Urinary Tract Infection   (Consider location/radiation/quality/duration/timing/severity/associated sxs/prior Treatment) HPI Comments: 48 year old male presents complaining of urinary frequency, urgency, burning, and low back pain. This initially began 2 weeks ago. To push fluids and cranberry juice and it seemed to get better until a few days ago when his symptoms returned. He believes he has a bladder infection. He has never had one before. No recent hospitalizations or catheterizations. No fever, chills, NVD, abdominal pain, flank pain. He has been sexually active with his wife, he believes she may have caught this from him.  Patient is a 48 y.o. male presenting with urinary tract infection.  Urinary Tract Infection Pertinent negatives include no chest pain, no abdominal pain and no shortness of breath.    Past Medical History  Diagnosis Date  . PONV (postoperative nausea and vomiting)     DIFFICULTY WAKING UP   . Hepatitis     07/2011 LIVER ENZYMES UP CAME DOWN TO NORMAL ONCE MEDS CHANGED  . GERD (gastroesophageal reflux disease)   . Headache(784.0)     MIGRAINES    Past Surgical History  Procedure Laterality Date  . Neck surgery  1995    FUSION   . Shoulder surgery  2000    RIGHT     LEFT  2004   . Distal biceps tendon repair  2008    LEFT   . Anterior cervical decomp/discectomy fusion  03/29/2011    Procedure: ANTERIOR CERVICAL DECOMPRESSION/DISCECTOMY FUSION 2 LEVELS;  Surgeon: Reinaldo Meeker, MD;  Location: [facility];  Service: Neurosurgery;  Laterality: N/A;  CERVICAL FOUR-FIVE, CERVICAL SIX-SEVEN  ANTERIOR CERVICAL DECOMPRESSION FUSION  WITH TRABECULAR METAL  AND PLATE   History reviewed. No pertinent family history. History  Substance Use Topics  . Smoking status: Current Every Day Smoker -- 0.50 packs/day  . Smokeless tobacco: Not on file  . Alcohol  Use: Yes     Comment: RARELY     Review of Systems  Constitutional: Negative for fever, chills and fatigue.  HENT: Negative for sore throat.   Eyes: Negative for visual disturbance.  Respiratory: Negative for cough and shortness of breath.   Cardiovascular: Negative for chest pain, palpitations and leg swelling.  Gastrointestinal: Negative for nausea, vomiting, abdominal pain, diarrhea and constipation.  Genitourinary: Positive for dysuria, urgency and frequency. Negative for hematuria, flank pain, discharge, scrotal swelling, genital sores and testicular pain.  Musculoskeletal: Positive for back pain. Negative for arthralgias, myalgias, neck pain and neck stiffness.  Skin: Negative for rash.  Neurological: Negative for dizziness, weakness and light-headedness.    Allergies  Review of patient's allergies indicates no known allergies.  Home Medications   Current Outpatient Rx  Name  Route  Sig  Dispense  Refill  . cetirizine (ZYRTEC ALLERGY) 10 MG tablet   Oral   Take 10 mg by mouth at bedtime.         . ciprofloxacin (CIPRO) 500 MG tablet   Oral   Take 1 tablet (500 mg total) by mouth every 12 (twelve) hours.   20 tablet   0   . diazepam (VALIUM) 5 MG tablet   Oral   Take 1 tablet (5 mg total) by mouth 2 (two) times daily. For muscle spasm   10 tablet   0   . levothyroxine (SYNTHROID, LEVOTHROID) 50 MCG tablet   Oral  Take 50 mcg by mouth daily before breakfast.         . Multiple Vitamin (MULITIVITAMIN WITH MINERALS) TABS   Oral   Take 1 tablet by mouth daily.         . naproxen (NAPROSYN) 500 MG tablet   Oral   Take 1 tablet (500 mg total) by mouth 2 (two) times daily.   30 tablet   0   . naproxen sodium (ANAPROX) 220 MG tablet   Oral   Take 440 mg by mouth 2 (two) times daily with a meal.         . omeprazole (PRILOSEC) 20 MG capsule   Oral   Take 20 mg by mouth daily.         Marland Kitchen OVER THE COUNTER MEDICATION   Oral   Take 2 capsules by mouth  daily. Triple Vassie Moselle         . OVER THE COUNTER MEDICATION   Oral   Take 1 capsule by mouth daily. Test x 180         . OVER THE COUNTER MEDICATION   Oral   Take 2 capsules by mouth every evening. phenitropic         . OVER THE COUNTER MEDICATION   Oral   Take 1 tablet by mouth daily. A.D.P         . oxyCODONE-acetaminophen (PERCOCET/ROXICET) 5-325 MG per tablet   Oral   Take 1-2 tablets by mouth every 4 (four) hours as needed for pain.   17 tablet   0   . propranolol ER (INDERAL LA) 80 MG 24 hr capsule   Oral   Take 80 mg by mouth at bedtime.          BP 142/99  Pulse 68  Temp(Src) 97.8 F (36.6 C) (Oral)  Resp 18  SpO2 98% Physical Exam  Nursing note and vitals reviewed. Constitutional: He is oriented to person, place, and time. He appears well-developed and well-nourished. No distress.  HENT:  Head: Normocephalic.  Cardiovascular: Normal rate, regular rhythm and normal heart sounds.  Exam reveals no gallop and no friction rub.   No murmur heard. Pulmonary/Chest: Effort normal and breath sounds normal. No respiratory distress. He has no wheezes.  Abdominal: Normal appearance and bowel sounds are normal. He exhibits no distension and no mass. There is no tenderness. There is no rebound, no guarding and no CVA tenderness.  Neurological: He is alert and oriented to person, place, and time. Coordination normal.  Skin: Skin is warm and dry. No rash noted. He is not diaphoretic.  Psychiatric: He has a normal mood and affect. Judgment normal.    ED Course  Procedures (including critical care time) Labs Review Labs Reviewed  POCT URINALYSIS DIP (DEVICE) - Abnormal; Notable for the following:    Hgb urine dipstick SMALL (*)    Leukocytes, UA LARGE (*)    All other components within normal limits  URINE CULTURE  URINE CYTOLOGY ANCILLARY ONLY   Imaging Review No results found.   MDM   1. UTI (lower urinary tract infection)    Culture sent. Urine  cytology sent. Treating with Cipro twice a day for 10 days for complicated UTI. Followup when necessary if not improving   Meds ordered this encounter  Medications  . ciprofloxacin (CIPRO) 500 MG tablet    Sig: Take 1 tablet (500 mg total) by mouth every 12 (twelve) hours.    Dispense:  20 tablet  Refill:  0    Order Specific Question:  Supervising Provider    Answer:  Clementeen GrahamOREY, EVAN, Kathie RhodesS [[id number]]      Graylon GoodZachary H Nasiir Monts, PA-C 05/24/13 [phone]

## 2013-05-25 NOTE — ED Provider Notes (Signed)
Medical screening examination/treatment/procedure(s) were performed by resident physician or non-physician practitioner and as supervising physician I was immediately available for consultation/collaboration.   Naesha Buckalew DOUGLAS MD.   Briaunna Grindstaff D Raeshawn Tafolla, MD 05/25/13 1413 

## 2013-05-26 LAB — URINE CULTURE: Colony Count: >=100000

## 2013-05-26 NOTE — ED Notes (Addendum)
Urine culture: >100,000 colonies E. Coli.  Pt. adequately treated with Cipro. Manuel Pearson, Ettamae Barkett M 05/26/2013 GC/Chlamydia/Trich neg. 05/27/2013

## 2013-05-27 LAB — URINE CYTOLOGY ANCILLARY ONLY
CHLAMYDIA, DNA PROBE: NEGATIVE
NEISSERIA GONORRHEA: NEGATIVE
Trichomonas: NEGATIVE

## 2014-09-04 ENCOUNTER — Ambulatory Visit: Payer: Worker's Compensation

## 2014-09-04 ENCOUNTER — Ambulatory Visit (INDEPENDENT_AMBULATORY_CARE_PROVIDER_SITE_OTHER): Payer: Worker's Compensation | Admitting: Family Medicine

## 2014-09-04 VITALS — BP 137/98 | HR 71 | Temp 98.1°F | Resp 18 | Ht 66.0 in | Wt 212.0 lb

## 2014-09-04 DIAGNOSIS — M25512 Pain in left shoulder: Secondary | ICD-10-CM

## 2014-09-04 DIAGNOSIS — S46912A Strain of unspecified muscle, fascia and tendon at shoulder and upper arm level, left arm, initial encounter: Secondary | ICD-10-CM

## 2014-09-04 MED ORDER — MELOXICAM 15 MG PO TABS: ORAL_TABLET | ORAL | Status: DC

## 2014-09-04 MED ORDER — CYCLOBENZAPRINE HCL 5 MG PO TABS: 5.0000 mg | ORAL_TABLET | Freq: Three times a day (TID) | ORAL | Status: DC | PRN

## 2014-09-04 MED ORDER — HYDROCODONE-ACETAMINOPHEN 5-325 MG PO TABS: 1.0000 | ORAL_TABLET | Freq: Three times a day (TID) | ORAL | Status: DC | PRN

## 2014-09-04 NOTE — Progress Notes (Signed)
 Chief Complaint:  Chief Complaint  Patient presents with  . Shoulder Pain    left, injured at work    HPI: Manuel Pearson is a 49 y.o. 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 He has not tried lifting his shoulder since he ahs pain with ROM. He denies  numbness or tingling and has had some swelling  In his finger. He has had left shoulder surgery in 2004 and also 2008  and neck surgery in 2013   Past Surgical History  Procedure Laterality Date  . Neck surgery  1995    FUSION   . Shoulder surgery  2000    RIGHT     LEFT  2004   . Distal biceps tendon repair  2008    LEFT   . Anterior cervical decomp/discectomy fusion  03/29/2011    Procedure: ANTERIOR CERVICAL DECOMPRESSION/DISCECTOMY FUSION 2 LEVELS;  Surgeon: Reinaldo Meeker, MD;  Location: [facility];  Service: Neurosurgery;  Laterality: N/A;  CERVICAL FOUR-FIVE, CERVICAL SIX-SEVEN  ANTERIOR CERVICAL DECOMPRESSION FUSION  WITH TRABECULAR METAL  AND PLATE   History   Social History  . Marital Status: Married    Spouse Name: N/A  . Number of Children: N/A  . Years of Education: N/A   Social History Main Topics  . Smoking status: Current Every Day Smoker -- 0.50 packs/day  . Smokeless tobacco: Not on file  . Alcohol Use: Yes     Comment: RARELY   . Drug Use: No  . Sexual Activity: Not on file   Other Topics Concern  . None   Social History Narrative   No family history on file. No Known Allergies    ROS: The patient denies fevers, chills, night sweats, unintentional weight loss, chest pain, palpitations, wheezing, dyspnea on exertion, nausea, vomiting, abdominal pain, dysuria, hematuria, melena, numbness, weakness, or tingling.  All other systems have been reviewed and were otherwise negative with the  exception of those mentioned in the HPI and as above.    PHYSICAL EXAM: Filed Vitals:   09/04/14 0833  BP: 137/98  Pulse: 71  Temp: 98.1 F (36.7 C)  Resp: 18   Filed Vitals:   09/04/14 0833  Height:  (1.676 m)  Weight: 212 lb (96.163 kg)   Body mass index is 34.23 kg/(m^2).   General: Alert, no acute distress HEENT:  Normocephalic, atraumatic, oropharynx patent. EOMI, PERRLA Cardiovascular:  Regular rate and rhythm, no rubs murmurs or gallops.  No Carotid bruits, radial pulse intact. No pedal edema.  Respiratory: Clear to auscultation bilaterally.  No wheezes, rales, or rhonchi.  No cyanosis, no use of accessory musculature GI: No organomegaly, abdomen is soft and non-tender, positive bowel sounds.  No masses. Skin: No rashes. Neurologic: Facial musculature symmetric. Psychiatric: Patient is appropriate throughout our interaction. Lymphatic: No cervical lymphadenopathy Musculoskeletal: Gait intact. Left shoulder-decrease ROM, poor exam due to pain Able to only do 30 degrees 4/5 strength 5/5 grip strength   LABS:    EKG/XRAY:   Primary read interpreted by Dr. Conley Rolls at [facility]. Negative for fracture dislocation   ASSESSMENT/PLAN: Encounter Diagnoses  Name Primary?  . Left shoulder pain   . Left shoulder strain, initial encounter Yes  Rx mobic, flexeril, norco  NO substance issues Fu in 3 days on Sunday for recheck  Sling was given Poor shoulder exam due to pain, needs recheck, still painful then consider ortho referral due to prior hx of shoulder and neck surgeries   Gross sideeffects, risk and benefits, and alternatives of medications d/w patient. Patient is aware that all medications have potential sideeffects and we are unable to predict every sideeffect or drug-drug interaction that may occur.  ,  PHUONG, DO 09/04/2014 9:35 AM

## 2014-09-07 ENCOUNTER — Ambulatory Visit (INDEPENDENT_AMBULATORY_CARE_PROVIDER_SITE_OTHER): Payer: Worker's Compensation | Admitting: Family Medicine

## 2014-09-07 VITALS — BP 114/70 | HR 64 | Temp 98.1°F | Resp 18 | Ht 66.0 in | Wt 212.0 lb

## 2014-09-07 DIAGNOSIS — M25512 Pain in left shoulder: Secondary | ICD-10-CM | POA: Diagnosis not present

## 2014-09-07 NOTE — Patient Instructions (Signed)
Decrease use of hydrocodone as possible, continue meloxicam and flexeril as needed.  Sling as needed, but come out of sling few times per day if possible for range of motion until seen by ortho.  We are referring you to ortho, but follow up with [place] in next week if you have not seen ortho by then.  Return to the clinic or go to the nearest emergency room if any of your symptoms worsen or new symptoms occur.  Shoulder Pain The shoulder is the joint that connects your arms to your body. The bones that form the shoulder joint include the upper arm bone (humerus), the shoulder blade (scapula), and the collarbone (clavicle). The top of the humerus is shaped like a ball and fits into a rather flat socket on the scapula (glenoid cavity). A combination of muscles and strong, fibrous tissues that connect muscles to bones (tendons) support your shoulder joint and hold the ball in the socket. Small, fluid-filled sacs (bursae) are located in different areas of the joint. They act as cushions between the bones and the overlying soft tissues and help reduce friction between the gliding tendons and the bone as you move your arm. Your shoulder joint allows a wide range of motion in your arm. This range of motion allows you to do things like scratch your back or throw a ball. However, this range of motion also makes your shoulder more prone to pain from overuse and injury. Causes of shoulder pain can originate from both injury and overuse and usually can be grouped in the following four categories:  Redness, swelling, and pain (inflammation) of the tendon (tendinitis) or the bursae (bursitis).  Instability, such as a dislocation of the joint.  Inflammation of the joint (arthritis).  Broken bone (fracture). HOME CARE INSTRUCTIONS   Apply ice to the sore area.  Put ice in a plastic bag.  Place a towel between your skin and the bag.  Leave the ice on for 15-20 minutes, 3-4 times per day for the first 2 days, or as  directed by your health care provider.  Stop using cold packs if they do not help with the pain.  If you have a shoulder sling or immobilizer, wear it as long as your caregiver instructs. Only remove it to shower or bathe. Move your arm as little as possible, but keep your hand moving to prevent swelling.  Squeeze a soft ball or foam pad as much as possible to help prevent swelling.  Only take over-the-counter or prescription medicines for pain, discomfort, or fever as directed by your caregiver. SEEK MEDICAL CARE IF:   Your shoulder pain increases, or new pain develops in your arm, hand, or fingers.  Your hand or fingers become cold and numb.  Your pain is not relieved with medicines. SEEK IMMEDIATE MEDICAL CARE IF:   Your arm, hand, or fingers are numb or tingling.  Your arm, hand, or fingers are significantly swollen or turn white or blue. MAKE SURE YOU:   Understand these instructions.  Will watch your condition.  Will get help right away if you are not doing well or get worse. Document Released: 12/08/2004 Document Revised: 07/15/2013 Document Reviewed: 02/12/2011 [facility] Patient Information 2015 [place], [place]. This information is not intended to replace advice given to you by your health care provider. Make sure you discuss any questions you have with your health care provider.

## 2014-09-07 NOTE — Progress Notes (Signed)
Subjective:  This chart was scribed for Meredith Staggers, MD by [facility], medical scribe at [facility].The patient was seen in exam room 01 and the patient's care was started at 10:05 AM.   Patient ID: Manuel Pearson, male    DOB: 1966/02/07, 49 y.o.   MRN: [id number] Chief Complaint  Patient presents with  . Follow-up    left shoulder   HPI  HPI Comments: Manuel Pearson is a 49 y.o. male who presents to [facility] here for follow up of left shoulder due to an injury while at work. Date on injury was 09/04/2014.  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 No neck pain. He has had left shoulder injury in 2004, and  neck surgery in 1995 and 2013. At last visit guarded exam so today he is here for a follow up. X-ray on 09/04/2014 showed mild AC and JH degenerative change, no acute abnormality.  Today the pain has not improved, the pain does not radiate down his left arm but his left hand is swollen.    No Known Allergies Prior to Admission medications   Medication Sig Start Date End Date Taking? Authorizing Provider  cetirizine (ZYRTEC ALLERGY) 10 MG tablet Take 10 mg by mouth at bedtime.   Yes Historical Provider, MD  cyclobenzaprine (FLEXERIL) 5 MG tablet Take 1 tablet (5 mg total) by mouth 3 (three) times daily as needed for muscle spasms. 09/04/14  Yes Thao P Le, DO  HYDROcodone-acetaminophen (NORCO) 5-325 MG per tablet Take 1 tablet by mouth every 8 (eight) hours as needed for moderate pain. Take with stool softener 09/04/14  Yes Thao P Le, DO  levothyroxine (SYNTHROID, LEVOTHROID) 50 MCG tablet Take 50 mcg by mouth daily before breakfast.   Yes Historical Provider, MD  meloxicam (MOBIC) 15 MG tablet Take 1/2 to 1 tab daily with food. No other NSAIDs. 09/04/14  Yes Thao P Le, DO  Multiple Vitamin (MULITIVITAMIN WITH MINERALS)  TABS Take 1 tablet by mouth daily.   Yes Historical Provider, MD  omeprazole (PRILOSEC) 20 MG capsule Take 20 mg by mouth daily.   Yes Historical Provider, MD  OVER THE COUNTER MEDICATION Take 2 capsules by mouth daily. Triple Vassie Moselle   Yes Historical Provider, MD  OVER THE COUNTER MEDICATION Take 1 capsule by mouth daily. Test x 180   Yes Historical Provider, MD  OVER THE COUNTER MEDICATION Take 2 capsules by mouth every evening. phenitropic   Yes Historical Provider, MD  OVER THE COUNTER MEDICATION Take 1 tablet by mouth daily. A.D.P   Yes Historical Provider, MD  prazosin (MINIPRESS) 5 MG capsule Take 5 mg by mouth at bedtime.   Yes Historical Provider, MD  propranolol ER (INDERAL LA) 80 MG 24 hr capsule Take 80 mg by mouth at bedtime.   Yes Historical Provider, MD   Review of Systems  Musculoskeletal: Positive for arthralgias. Negative for neck pain.     Objective:  BP 114/70 mmHg  Pulse 64  Temp(Src) 98.1 F (36.7 C) (Oral)  Resp 18  Ht 5\' 6"  (1.676 m)  Wt 212 lb (96.163 kg)  BMI 34.23 kg/m2  SpO2 99% Physical Exam  Constitutional: He is oriented to person, place, and time. He appears well-developed and well-nourished. No distress.  HENT:  Head: Normocephalic and  atraumatic.  Eyes: Pupils are equal, round, and reactive to light.  Neck: Normal range of motion.  C-spine: Slight decreased ROM with extension and rotation but equal bilaterally. Slight Pearson sensation with right lateral flexion.  Cardiovascular: Normal rate and regular rhythm.   Pulmonary/Chest: Effort normal. No respiratory distress.  Musculoskeletal: Normal range of motion.  Left Shoulder: Clavicle and [place] node are non-tender. AC node is tender without apparent piano key or focal swelling. The remainder of shoulder was non tender. ROM guarded to approximately 90 degrees active flexion, and 100 degrees with passive flexion. Guarded at 100 degrees passive and active abduction. Internal rotation to approximately L3.  Negative left off. Pain with empty can but other RTC testing is equal. Positive O'Brien's, positive Hawkins', and positive Neer's. Guarded with attempted Clunk exam.  Neurological: He is alert and oriented to person, place, and time.  Reflex Scores:      Tricep reflexes are 2+ on the right side and 2+ on the left side.      Bicep reflexes are 2+ on the right side and 2+ on the left side.      Brachioradialis reflexes are 2+ on the right side and 2+ on the left side. Skin: Skin is warm and dry.  Psychiatric: He has a normal mood and affect. His behavior is normal.  Nursing note and vitals reviewed.     Assessment & Plan:   Manuel Pearson is a 49 y.o. male Left shoulder pain - Plan: AMB referral to orthopedics  Still some guarding with exam, but pain at [facility] joint - AC sprain possible, but other testing suspicious for labral tear or at least some component of subacromial bursitis.   -refer to ortho for further eval, continue work restrictions, flexeril and mobic, and wean hydrocodone as much as possible as reported difficulty with this medicine in past.   -sling as needed, but encouraged ROM as able until eval by ortho.   -follow up in 1 week if not seen by ortho at that time.   No orders of the defined types were placed in this encounter.   Patient Instructions  Decrease use of hydrocodone as possible, continue meloxicam and flexeril as needed.  Sling as needed, but come out of sling few times per day if possible for range of motion until seen by ortho.  We are referring you to ortho, but follow up with [place] in next week if you have not seen ortho by then.  Return to the clinic or go to the nearest emergency room if any of your symptoms worsen or new symptoms occur.  Shoulder Pain The shoulder is the joint that connects your arms to your body. The bones that form the shoulder joint include the upper arm bone (humerus), the shoulder blade (scapula), and the collarbone (clavicle). The top of the  humerus is shaped like a ball and fits into a rather flat socket on the scapula (glenoid cavity). A combination of muscles and strong, fibrous tissues that connect muscles to bones (tendons) support your shoulder joint and hold the ball in the socket. Small, fluid-filled sacs (bursae) are located in different areas of the joint. They act as cushions between the bones and the overlying soft tissues and help reduce friction between the gliding tendons and the bone as you move your arm. Your shoulder joint allows a wide range of motion in your arm. This range of motion allows you to do things like scratch your back or throw a ball. However,  this range of motion also makes your shoulder more prone to pain from overuse and injury. Causes of shoulder pain can originate from both injury and overuse and usually can be grouped in the following four categories:  Redness, swelling, and pain (inflammation) of the tendon (tendinitis) or the bursae (bursitis).  Instability, such as a dislocation of the joint.  Inflammation of the joint (arthritis).  Broken bone (fracture). HOME CARE INSTRUCTIONS   Apply ice to the sore area.  Put ice in a plastic bag.  Place a towel between your skin and the bag.  Leave the ice on for 15-20 minutes, 3-4 times per day for the first 2 days, or as directed by your health care provider.  Stop using cold packs if they do not help with the pain.  If you have a shoulder sling or immobilizer, wear it as long as your caregiver instructs. Only remove it to shower or bathe. Move your arm as little as possible, but keep your hand moving to prevent swelling.  Squeeze a soft ball or foam pad as much as possible to help prevent swelling.  Only take over-the-counter or prescription medicines for pain, discomfort, or fever as directed by your caregiver. SEEK MEDICAL CARE IF:   Your shoulder pain increases, or new pain develops in your arm, hand, or fingers.  Your hand or fingers  become cold and numb.  Your pain is not relieved with medicines. SEEK IMMEDIATE MEDICAL CARE IF:   Your arm, hand, or fingers are numb or tingling.  Your arm, hand, or fingers are significantly swollen or turn white or blue. MAKE SURE YOU:   Understand these instructions.  Will watch your condition.  Will get help right away if you are not doing well or get worse. Document Released: 12/08/2004 Document Revised: 07/15/2013 Document Reviewed: 02/12/2011 [facility] Patient Information 2015 [place], [place]. This information is not intended to replace advice given to you by your health care provider. Make sure you discuss any questions you have with your health care provider.     I personally performed the services described in this documentation, which was scribed in my presence. The recorded information has been reviewed and considered, and addended by me as needed.

## 2014-09-14 ENCOUNTER — Ambulatory Visit (INDEPENDENT_AMBULATORY_CARE_PROVIDER_SITE_OTHER): Payer: Worker's Compensation | Admitting: Family Medicine

## 2014-09-14 VITALS — BP 110/70 | HR 68 | Temp 98.0°F | Resp 18 | Ht 66.0 in | Wt 219.1 lb

## 2014-09-14 DIAGNOSIS — M25512 Pain in left shoulder: Secondary | ICD-10-CM

## 2014-09-14 NOTE — Progress Notes (Addendum)
Subjective:  This chart was scribed for Manuel StaggersJeffrey Vyla Pint, MD by Manuel Pearson, Medical Scribe. This patient was seen in room 2 and the patient's care was started 11:30 AM.    Patient ID: Manuel Pearson, male    DOB: [birth year], 49 y.o.   MRN: [id number]  HPI Manuel Pearson is a 49 y.o. male Here for a f/u on his left shoulder injury while at work on June 23rd. He was last seen on June 26th [facility] versus labral tear and component impingement. He was referred to ortho, continued on flexeril and mobic, wean hydrocodone as noted. Shoulder sling as needed until ortho eval. Continued on restricted work. In review of referral note, he will be seen by Manuel Pearson, but requested MRI for his shoulder prior to that eval which I can order today.   Pt states that there's not been many changes since previous visit. He's been doing some exercises for his arm. He had a titanium plate and screws put into his neck on March 29 2011. He takes 2 hydrocodone a day, 2 flexeril a day and 1 mobic a day. He has 10 hydrocodone left, 10 flexeril left, and 18-20 mobic left.     There are no active problems to display for this patient.  Past Medical History  Diagnosis Date  . PONV (postoperative nausea and vomiting)     DIFFICULTY WAKING UP   . GERD (gastroesophageal reflux disease)   . Headache(784.0)     MIGRAINES   . Elevated liver enzymes     has had increases due to headache meds, but back to normal    Past Surgical History  Procedure Laterality Date  . Neck surgery  1995    FUSION   . Shoulder surgery  2000    RIGHT     LEFT  2004   . Distal biceps tendon repair  2008    LEFT   . Anterior cervical decomp/discectomy fusion  03/29/2011    Procedure: ANTERIOR CERVICAL DECOMPRESSION/DISCECTOMY FUSION 2 LEVELS;  Surgeon: Manuel Meekerandy O Kritzer, MD;  Location: [facility];  Service: Neurosurgery;  Laterality: N/A;  CERVICAL FOUR-FIVE, CERVICAL SIX-SEVEN  ANTERIOR CERVICAL DECOMPRESSION FUSION   WITH TRABECULAR METAL  AND PLATE   No Known Allergies Prior to Admission medications   Medication Sig Start Date End Date Taking? Authorizing Provider  cetirizine (ZYRTEC ALLERGY) 10 MG tablet Take 10 mg by mouth at bedtime.   Yes Historical Provider, MD  cyclobenzaprine (FLEXERIL) 5 MG tablet Take 1 tablet (5 mg total) by mouth 3 (three) times daily as needed for muscle spasms. 09/04/14  Yes Manuel P Le, DO  HYDROcodone-acetaminophen (NORCO) 5-325 MG per tablet Take 1 tablet by mouth every 8 (eight) hours as needed for moderate pain. Take with stool softener 09/04/14  Yes Manuel P Le, DO  levothyroxine (SYNTHROID, LEVOTHROID) 50 MCG tablet Take 50 mcg by mouth daily before breakfast.   Yes Historical Provider, MD  meloxicam (MOBIC) 15 MG tablet Take 1/2 to 1 tab daily with food. No other NSAIDs. 09/04/14  Yes Manuel P Le, DO  Multiple Vitamin (MULITIVITAMIN WITH MINERALS) TABS Take 1 tablet by mouth daily.   Yes Historical Provider, MD  omeprazole (PRILOSEC) 20 MG capsule Take 20 mg by mouth daily.   Yes Historical Provider, MD  OVER THE COUNTER MEDICATION Take 2 capsules by mouth daily. Triple Vassie MoselleGinsa Rush   Yes Historical Provider, MD  OVER THE COUNTER MEDICATION Take 1 capsule by mouth  daily. Test x 180   Yes Historical Provider, MD  OVER THE COUNTER MEDICATION Take 2 capsules by mouth every evening. phenitropic   Yes Historical Provider, MD  OVER THE COUNTER MEDICATION Take 1 tablet by mouth daily. A.D.P   Yes Historical Provider, MD  prazosin (MINIPRESS) 5 MG capsule Take 5 mg by mouth at bedtime.   Yes Historical Provider, MD  propranolol ER (INDERAL LA) 80 MG 24 hr capsule Take 80 mg by mouth at bedtime.   Yes Historical Provider, MD   History   Social History  . Marital Status: Married    Spouse Name: N/A  . Number of Children: N/A  . Years of Education: N/A   Occupational History  . Not on file.   Social History Main Topics  . Smoking status: Current Every Day Smoker -- 0.50 packs/day    . Smokeless tobacco: Not on file  . Alcohol Use: Yes     Comment: RARELY   . Drug Use: No  . Sexual Activity: Not on file   Other Topics Concern  . Not on file   Social History Narrative        Review of Systems  Constitutional: Negative for fever.  HENT: Negative for sore throat.   Gastrointestinal: Negative for nausea, vomiting and diarrhea.  Musculoskeletal: Positive for arthralgias (left shoulder).  Skin: Negative for wound.       Objective:   Physical Exam  Constitutional: He is oriented to person, place, and time. He appears well-developed and well-nourished. No distress.  HENT:  Head: Normocephalic and atraumatic.  Eyes: EOM are normal. Pupils are equal, round, and reactive to light.  Neck: Neck supple.  Cardiovascular: Normal rate.   Pulmonary/Chest: Effort normal. No respiratory distress.  Musculoskeletal: Normal range of motion.  slight decreased on cervical spine with minimal discomfort looking left and right  left AC tender without appreciation  [place] and clavicle nontender flexion 115-120 degree abduction to the side 120 degree internal rotation approx to L4  some guarding and discomfort with external rotation and RTC   pain with empty can NVI distally grip strengths equal   Neurological: He is alert and oriented to person, place, and time.  Skin: Skin is warm and dry.  Psychiatric: He has a normal mood and affect. His behavior is normal.  Nursing note and vitals reviewed.   Filed Vitals:   09/14/14 1039  BP: 110/70  Pulse: 68  Temp: 98 F (36.7 C)  TempSrc: Oral  Resp: 18  Height: 5\' 6"  (1.676 m)  Weight: 219 lb 2 oz (99.394 kg)  SpO2: 98%         Assessment & Plan:  Manuel Pearson is a 49 y.o. male Left shoulder pain - Plan: MR Shoulder Left Wo Contrast Persistent left shoulder pain due to injury at work on September 04, 2014.  Differential diagnosis of labral tear versus rotator cuff injury although there is some tenderness over AC  joint. Persistent pain without significant improvement since last visit.   -Will obtain MRI as requested and still plan on Ortho follow-up.  -Continue Mobic, Flexeril, and hydrocodone as needed. Plans to continue to wean hydrocodone. If refill needed prior to orthopedic visit, call and I can refill temporarily.   -Follow-up with me in 1 week if has not been seen by orthopedics.  -Continue work restrictions, see letter   No orders of the defined types were placed in this encounter.   Patient Instructions  If you are not seen  by ortho prior to running out of medication, call me for a refill prior to that visit.   I will order an MRI of your shoulder, but follow up as planned with ortho.   Continue gentle range of motion if tolerated, sling as needed until orthopaedic eval.   Return to the clinic or go to the nearest emergency room if any of your symptoms worsen or new symptoms occur.      I personally performed the services described in this documentation, which was scribed in my presence. The recorded information has been reviewed and considered, and addended by me as needed.

## 2014-09-14 NOTE — Patient Instructions (Signed)
If you are not seen by ortho prior to running out of medication, call me for a refill prior to that visit.   I will order an MRI of your shoulder, but follow up as planned with ortho.   Continue gentle range of motion if tolerated, sling as needed until orthopaedic eval.   Return to the clinic or go to the nearest emergency room if any of your symptoms worsen or new symptoms occur.

## 2014-09-17 ENCOUNTER — Telehealth: Payer: Self-pay

## 2014-09-17 ENCOUNTER — Other Ambulatory Visit: Payer: Self-pay | Admitting: Family Medicine

## 2014-09-17 DIAGNOSIS — M25512 Pain in left shoulder: Secondary | ICD-10-CM

## 2014-09-17 DIAGNOSIS — S46912A Strain of unspecified muscle, fascia and tendon at shoulder and upper arm level, left arm, initial encounter: Secondary | ICD-10-CM

## 2014-09-17 NOTE — Telephone Encounter (Signed)
Patient is calling to request a refill for hydrocodone. He would like it faxed to [facility] on [address] Patient phone: [phone]  He also wants us to know that we can use his cell phone as his primary number if we ever have to call him

## 2014-09-17 NOTE — Telephone Encounter (Signed)
Cannot be faxed, pt has to pick it up.  Left shoulder pain - Plan: MR Shoulder Left Wo Contrast Persistent left shoulder pain due to injury at work on September 04, 2014. Differential diagnosis of labral tear versus rotator cuff injury although there is some tenderness over AC joint. Persistent pain without significant improvement since last visit.  -Will obtain MRI as requested and still plan on Ortho follow-up. -Continue Mobic, Flexeril, and hydrocodone as needed. Plans to continue to wean hydrocodone. If refill needed prior to orthopedic visit, call and I can refill temporarily.  -Follow-up with me in 1 week if has not been seen by orthopedics. -Continue work restrictions, see letter

## 2014-09-18 MED ORDER — HYDROCODONE-ACETAMINOPHEN 5-325 MG PO TABS: 1.0000 | ORAL_TABLET | Freq: Three times a day (TID) | ORAL | Status: DC | PRN

## 2014-09-18 NOTE — Telephone Encounter (Signed)
Rx printed and ready for pick up.  Follow up with ortho next week for further management.

## 2014-09-18 NOTE — Telephone Encounter (Signed)
Did not realize Dr. Neva SeatGreene was out of the office. Can someone help?

## 2014-09-18 NOTE — Telephone Encounter (Signed)
Patient called to check the status of getting his hydrocodone refilled. States that he wants to pick it up today.

## 2014-09-19 NOTE — Telephone Encounter (Signed)
Notified pt on VM Rx is ready. 

## 2014-09-21 ENCOUNTER — Ambulatory Visit (INDEPENDENT_AMBULATORY_CARE_PROVIDER_SITE_OTHER): Payer: Worker's Compensation | Admitting: Emergency Medicine

## 2014-09-21 VITALS — BP 100/66 | HR 77 | Temp 97.8°F | Resp 18 | Ht 67.5 in | Wt 217.0 lb

## 2014-09-21 DIAGNOSIS — M25512 Pain in left shoulder: Secondary | ICD-10-CM

## 2014-09-21 NOTE — Progress Notes (Signed)
   Subjective:  Patient ID: Manuel Pearson, male    DOB: 1965-10-03  Age: 49 y.o. MRN: [id number]  CC: Follow-up   HPI Manuel Pearson presents  for follow-up of workman Workmen's Comp. injury of his left shoulder. They history the injury as well documented in the computer. He has undergone MRI of the shoulder the results are unknown to us at this time. He has an appointment with orthopedic surgeon on the 18th on Monday. He's had some improvement in the pain not requiring any narcotic at the present time. He is unable to return to work due to abscesses light duty.     Review of Systems  Constitutional: Negative for fever, chills and appetite change.  HENT: Negative for congestion, ear pain, postnasal drip, sinus pressure and sore throat.   Eyes: Negative for pain and redness.  Respiratory: Negative for cough, shortness of breath and wheezing.   Cardiovascular: Negative for leg swelling.  Gastrointestinal: Negative for nausea, vomiting, abdominal pain, diarrhea, constipation and blood in stool.  Endocrine: Negative for polyuria.  Genitourinary: Negative for dysuria, urgency, frequency and flank pain.  Musculoskeletal: Negative for gait problem.  Skin: Negative for rash.  Neurological: Negative for weakness and headaches.  Psychiatric/Behavioral: Negative for confusion and decreased concentration. The patient is not nervous/anxious.     Objective:  BP 100/66 mmHg  Pulse 77  Temp(Src) 97.8 F (36.6 C) (Oral)  Resp 18  Ht 5' 7.5" (1.715 m)  Wt 217 lb (98.431 kg)  BMI 33.47 kg/m2  SpO2 98%  Physical Exam  Constitutional: He is oriented to person, place, and time. He appears well-developed and well-nourished.  HENT:  Head: Normocephalic and atraumatic.  Eyes: Conjunctivae are normal. Pupils are equal, round, and reactive to light.  Pulmonary/Chest: Effort normal.  Musculoskeletal: He exhibits no edema.  Neurological: He is alert and oriented to person, place, and time.  Skin: Skin  is dry.  Psychiatric: He has a normal mood and affect. His behavior is normal. Thought content normal.   he is wearing a sling on the left arm that's ill fit for the size of his arm. He guards movement. The lateral shoulder is tender.    Assessment & Plan:   Manuel Pearson was seen today for follow-up.  Diagnoses and all orders for this visit:  Left shoulder pain   I have discontinued Manuel Pearson OVER THE COUNTER MEDICATION, OVER THE COUNTER MEDICATION, OVER THE COUNTER MEDICATION, and OVER THE COUNTER MEDICATION. I am also having him maintain his cetirizine, omeprazole, multivitamin with minerals, propranolol ER, levothyroxine, prazosin, meloxicam, cyclobenzaprine, and HYDROcodone-acetaminophen.  No orders of the defined types were placed in this encounter.   I change in size of a sling and told him to wait the orthopedic appointment next week. Meantime I kept him out of work. Appropriate red flag conditions were discussed with the patient as well as actions that should be taken.  Patient expressed his understanding.  Follow-up: Return in about 1 week (around 09/28/2014).  Carmelina DaneAnderson, Courteney Alderete S, MD

## 2015-10-10 IMAGING — CR DG SHOULDER 2+V*L*
3 series · 3 of 3 positions shown · non-contrast
Comparison: None.

CLINICAL DATA: Pain after lifting.

EXAM:
LEFT SHOULDER - 2+ VIEW

[AP (1 of 2)]
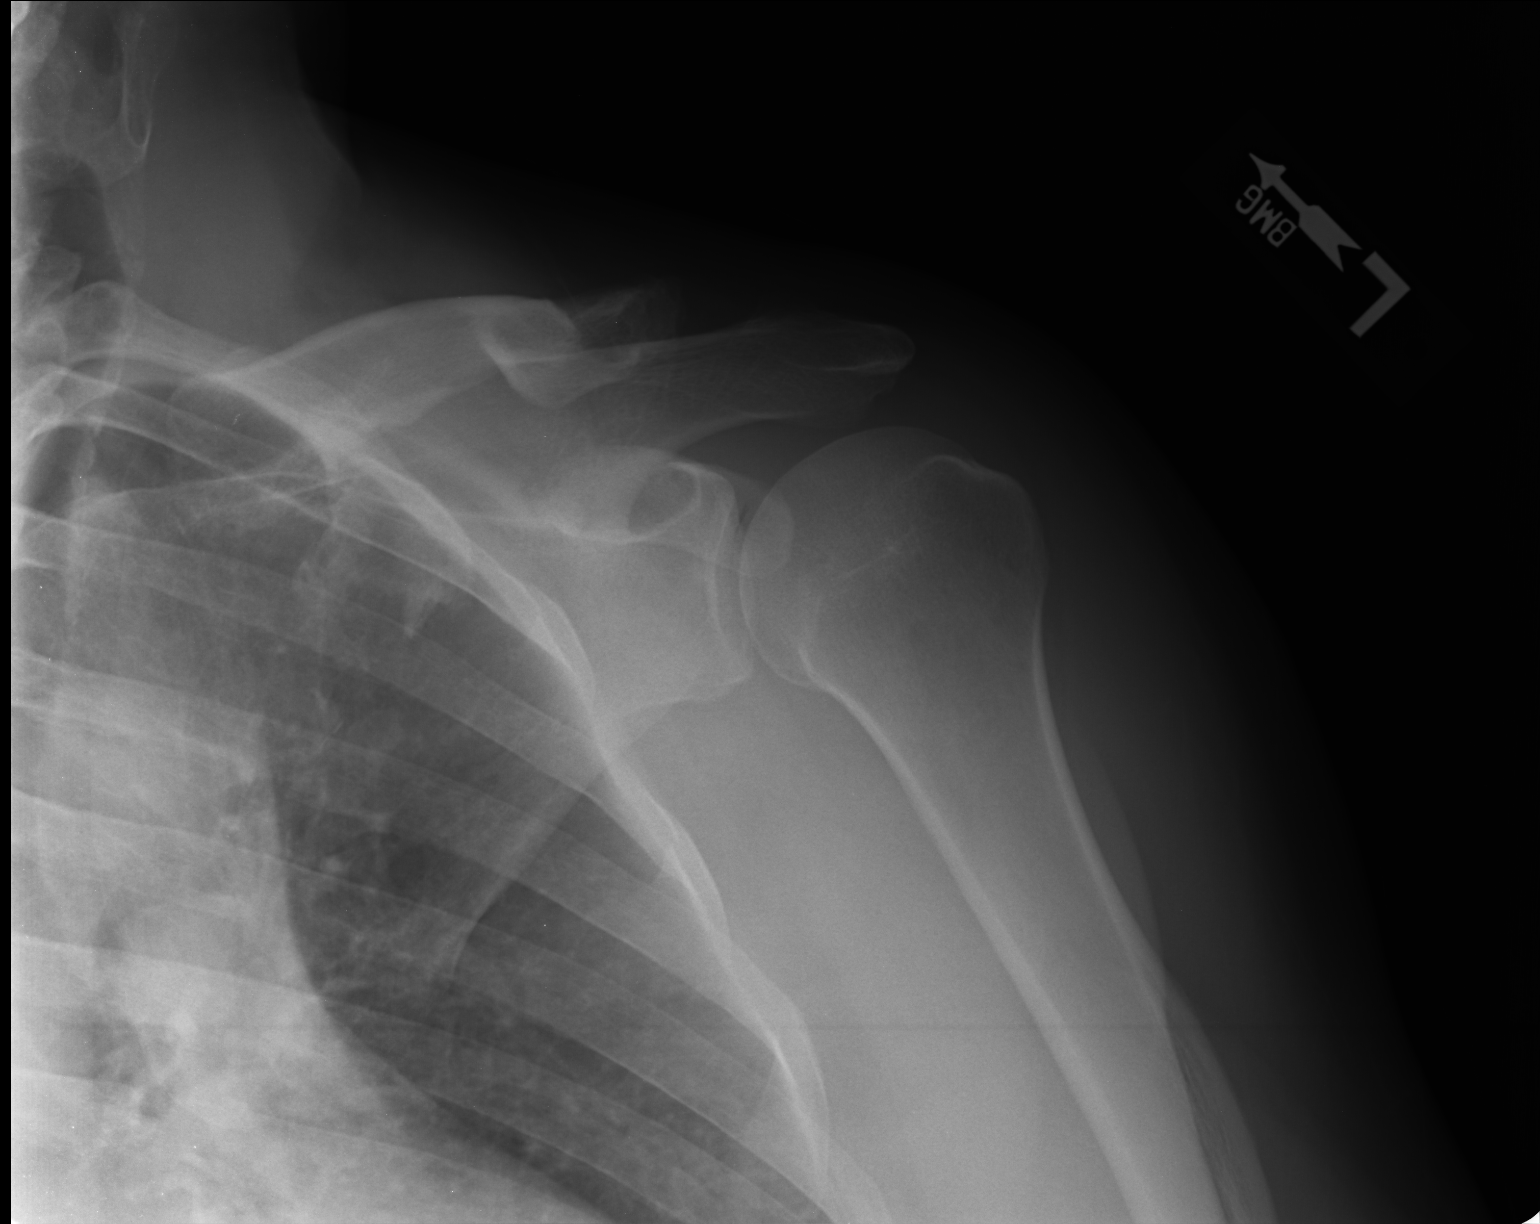

[AP (2 of 2)]
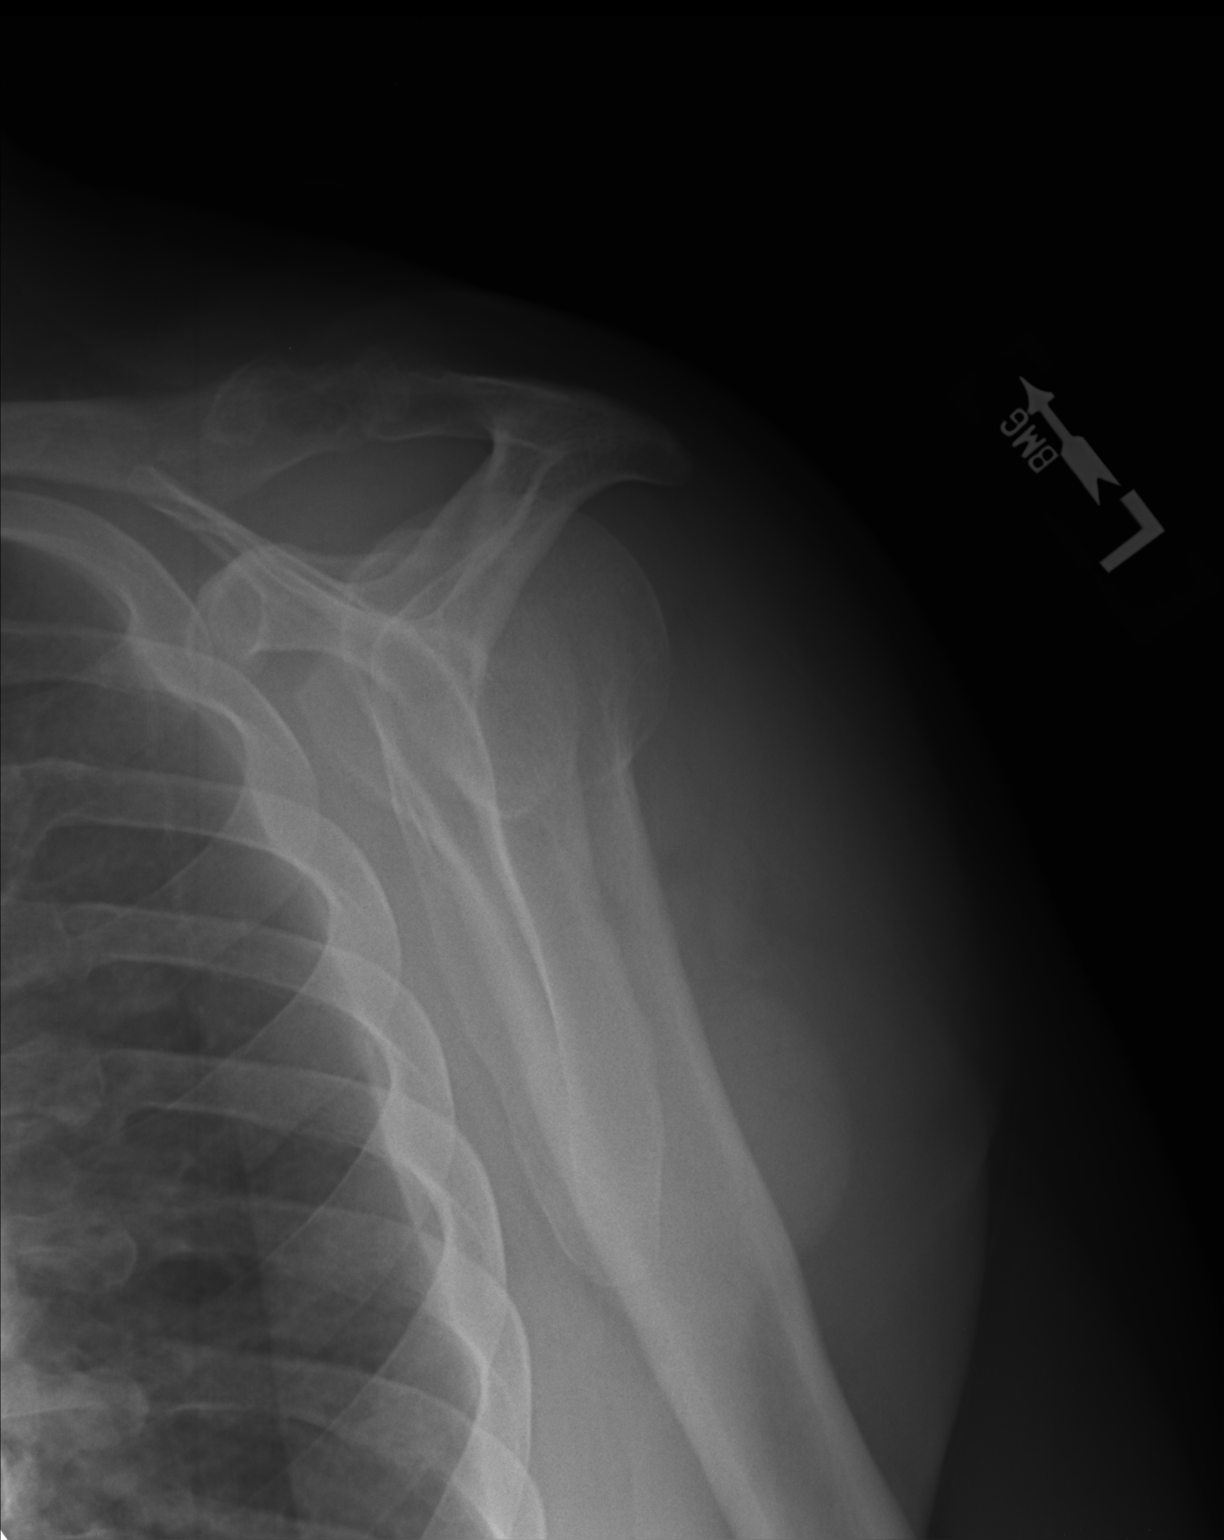

[axial]
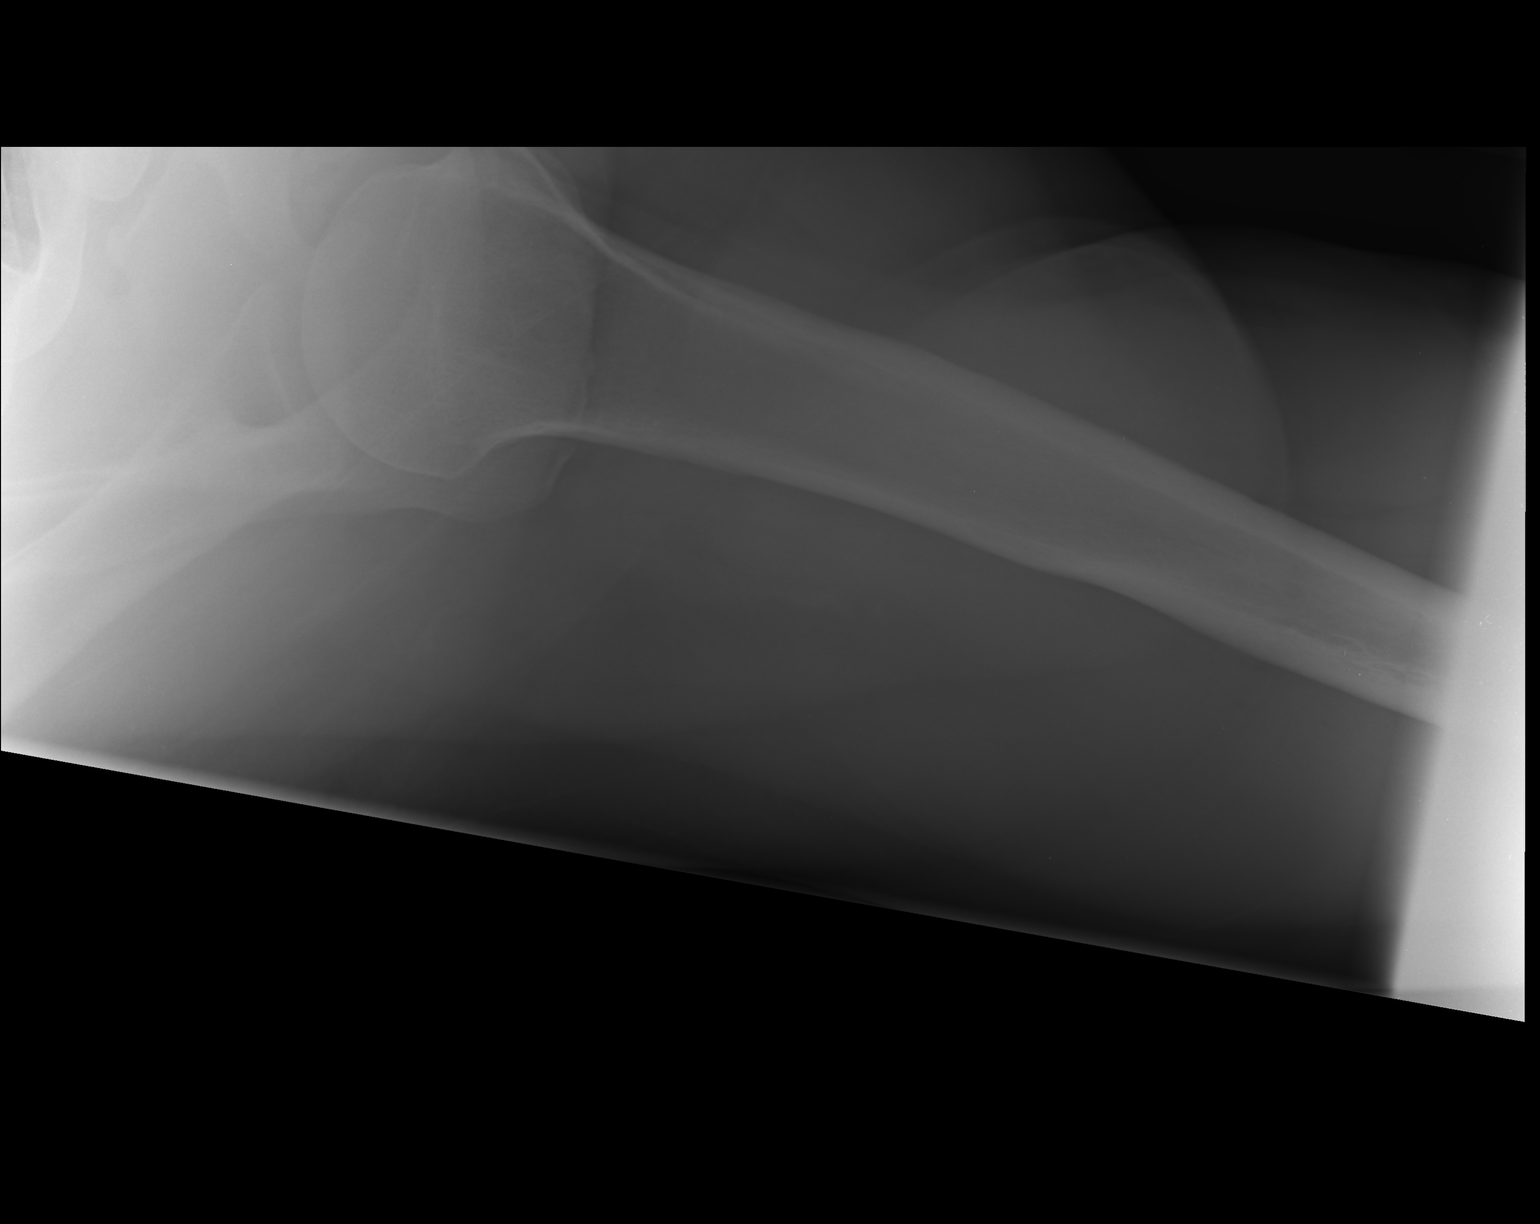

[3 of 3 positions shown; findings below may reference images not displayed]

FINDINGS: Acromioclavicular and glenohumeral degenerative change. No evidence
of fracture, dislocation, or separation.
IMPRESSION: Mild acromioclavicular and glenohumeral degenerative change. No
acute abnormality identified.

## 2017-03-14 DIAGNOSIS — G4730 Sleep apnea, unspecified: Secondary | ICD-10-CM

## 2017-03-14 HISTORY — PX: APPENDECTOMY: SHX54

## 2017-03-14 HISTORY — DX: Sleep apnea, unspecified: G47.30

## 2019-09-04 ENCOUNTER — Other Ambulatory Visit: Payer: Self-pay | Admitting: Neurosurgery

## 2019-09-09 NOTE — Progress Notes (Signed)
[id number] [place], [place] - [address] [place] [address] Phone: [phone] Fax: [phone]      Your procedure is scheduled on Wednesday June 30  Report to [facility] Main Entrance "A" at 1230 P.M., and check in at the Admitting office.  Call this number if you have problems the morning of surgery:  [phone]  Call [phone] if you have any questions prior to your surgery date Monday-Friday 8am-4pm    Remember:  Do not eat or drink after midnight the night before your surgery     Take these medicines the morning of surgery with A SIP OF WATER  hydrOXYzine (VISTARIL)  omeprazole (PRILOSEC) OXcarbazepine (TRILEPTAL) propranolol ER (INDERAL LA)  As of today, STOP taking any Aspirin (unless otherwise instructed by your surgeon) and Aspirin containing products, Aleve, Naproxen, Ibuprofen, Motrin, Advil, Goody's, BC's, all herbal medications, fish oil, and all vitamins.                      Do not wear jewelry            Do not wear lotions, powders, colognes, or deodorant.             Men may shave face and neck.            Do not bring valuables to the hospital.            [facility] is not responsible for any belongings or valuables.  Do NOT Smoke (Tobacco/Vapping) or drink Alcohol 24 hours prior to your procedure If you use a CPAP at night, you may bring all equipment for your overnight stay.   Contacts, glasses, dentures or bridgework may not be worn into surgery.      For patients admitted to the hospital, discharge time will be determined by your treatment team.   Patients discharged the day of surgery will not be allowed to drive home, and someone needs to stay with them for 24 hours.    Special instructions:   [place]- Preparing For Surgery  Before surgery, you can play an important role. Because skin is not sterile, your skin needs to be as free of germs as possible. You can  reduce the number of germs on your skin by washing with CHG (chlorahexidine gluconate) Soap before surgery.  CHG is an antiseptic cleaner which kills germs and bonds with the skin to continue killing germs even after washing.    Oral Hygiene is also important to reduce your risk of infection.  Remember - BRUSH YOUR TEETH THE MORNING OF SURGERY WITH YOUR REGULAR TOOTHPASTE  Please do not use if you have an allergy to CHG or antibacterial soaps. If your skin becomes reddened/irritated stop using the CHG.  Do not shave (including legs and underarms) for at least 48 hours prior to first CHG shower. It is OK to shave your face.  Please follow these instructions carefully.   1. Shower the NIGHT BEFORE SURGERY and the MORNING OF SURGERY with CHG Soap.   2. If you chose to wash your hair, wash your hair first as usual with your normal shampoo.  3. After you shampoo, rinse your hair and body thoroughly to remove the shampoo.  4. Use CHG as you would any other liquid soap. You can apply CHG directly to the skin and wash gently with a scrungie or a clean washcloth.   5. Apply the CHG  Soap to your body ONLY FROM THE NECK DOWN.  Do not use on open wounds or open sores. Avoid contact with your eyes, ears, mouth and genitals (private parts). Wash Face and genitals (private parts)  with your normal soap.   6. Wash thoroughly, paying special attention to the area where your surgery will be performed.  7. Thoroughly rinse your body with warm water from the neck down.  8. DO NOT shower/wash with your normal soap after using and rinsing off the CHG Soap.  9. Pat yourself dry with a CLEAN TOWEL.  10. Wear CLEAN PAJAMAS to bed the night before surgery, wear comfortable clothes the morning of surgery  11. Place CLEAN SHEETS on your bed the night of your first shower and DO NOT SLEEP WITH PETS.   Day of Surgery:   Do not apply any deodorants/lotions.  Please wear clean clothes to the hospital/surgery  center.   Remember to brush your teeth WITH YOUR REGULAR TOOTHPASTE.   Please read over the following fact sheets that you were given.

## 2019-09-10 ENCOUNTER — Encounter (HOSPITAL_COMMUNITY): Payer: Self-pay

## 2019-09-10 ENCOUNTER — Encounter (HOSPITAL_COMMUNITY)
Admission: RE | Admit: 2019-09-10 | Discharge: 2019-09-10 | Disposition: A | Discharge status: Home / Self Care | Payer: No Typology Code available for payment source | Source: Ambulatory Visit | Attending: Neurosurgery | Admitting: Neurosurgery

## 2019-09-10 ENCOUNTER — Other Ambulatory Visit: Payer: Self-pay

## 2019-09-10 ENCOUNTER — Other Ambulatory Visit (HOSPITAL_COMMUNITY)
Admission: RE | Admit: 2019-09-10 | Discharge: 2019-09-10 | Disposition: A | Discharge status: Home / Self Care | Payer: No Typology Code available for payment source | Source: Ambulatory Visit | Attending: Neurosurgery | Admitting: Neurosurgery

## 2019-09-10 DIAGNOSIS — Z01818 Encounter for other preprocedural examination: Secondary | ICD-10-CM | POA: Diagnosis not present

## 2019-09-10 DIAGNOSIS — Z20822 Contact with and (suspected) exposure to covid-19: Secondary | ICD-10-CM | POA: Diagnosis not present

## 2019-09-10 HISTORY — DX: Post-traumatic stress disorder, unspecified: F43.10

## 2019-09-10 HISTORY — DX: Anxiety disorder, unspecified: F41.9

## 2019-09-10 HISTORY — DX: Hypothyroidism, unspecified: E03.9

## 2019-09-10 HISTORY — DX: Essential (primary) hypertension: I10

## 2019-09-10 LAB — CBC
HCT: 46.5 % (ref 39.0–52.0)
Hemoglobin: 16.2 g/dL (ref 13.0–17.0)
MCH: 32 pg (ref 26.0–34.0)
MCHC: 34.8 g/dL (ref 30.0–36.0)
MCV: 91.9 fL (ref 80.0–100.0)
Platelets: 313 10*3/uL (ref 150–400)
RBC: 5.06 MIL/uL (ref 4.22–5.81)
RDW: 12.2 % (ref 11.5–15.5)
WBC: 6.5 10*3/uL (ref 4.0–10.5)
nRBC: 0 % (ref 0.0–0.2)

## 2019-09-10 LAB — BASIC METABOLIC PANEL
Anion gap: 8 (ref 5–15)
BUN: <5 mg/dL — ABNORMAL LOW (ref 6–20)
CO2: 26 mmol/L (ref 22–32)
Calcium: 9.3 mg/dL (ref 8.9–10.3)
Chloride: 94 mmol/L — ABNORMAL LOW (ref 98–111)
Creatinine, Ser: 0.78 mg/dL (ref 0.61–1.24)
GFR calc Af Amer: >60 mL/min (ref >60)
GFR calc non Af Amer: >60 mL/min (ref >60)
Glucose, Bld: 109 mg/dL — ABNORMAL HIGH (ref 70–99)
Potassium: 4.7 mmol/L (ref 3.5–5.1)
Sodium: 128 mmol/L — ABNORMAL LOW (ref 135–145)

## 2019-09-10 LAB — SURGICAL PCR SCREEN
MRSA, PCR: NEGATIVE
Staphylococcus aureus: NEGATIVE

## 2019-09-10 LAB — SARS CORONAVIRUS 2 (TAT 6-24 HRS): SARS Coronavirus 2: NEGATIVE

## 2019-09-10 NOTE — Progress Notes (Addendum)
PCP - Dr. Gerlene Fee with the [facility] Cardiologist - Denies  Chest x-ray - Not indicated EKG - 09/10/19 Stress Test - Denies ECHO - Denies Cardiac Cath - Denies  Sleep Study - Diagnosed with OSA CPAP - Nightly  DM - Denies  COVID TEST- 09/10/19  Anesthesia review: yes nsr with 1st av block on ekg Patient denies shortness of breath, fever, cough and chest pain at PAT appointment   All instructions explained to the patient, with a verbal understanding of the material. Patient agrees to go over the instructions while at home for a better understanding. Patient also instructed to self quarantine after being tested for COVID-19. The opportunity to ask questions was provided.

## 2019-09-10 NOTE — Progress Notes (Signed)
Anesthesia Chart Review:  Case: [id number] Date/Time: 09/11/19 1345   Procedure: Laminectomy and Foraminotomy - L2-L3 - right (Right Back) - 3C   Anesthesia type: General   Pre-op diagnosis: Stenosis   [facility]: [facility] ROOM 21 / [facility] OR   Surgeons: Donalee Citrin, MD      DISCUSSION: Patient is a 54 year old male scheduled for the above procedure.  History includes smoking, postoperative and/V, GERD, migraines, HTN, hypothyroidism, PTSD, OSA (CPAP), medication related transaminates (history of, resolved), neck surgery (1995; s/p C4-5/6-7 ACDF 03/29/11), appendectomy (2019). BMI is consistent with obesity.  Preoperative labs show mild hyponatremia with Na 128 (called to Erie Noe at Dr. Lonie Peak office).  He is on Trileptal which has a common reaction of hyponatremia. Will recheck on the day of surgery to evaluate for stability of sodium.   He denies shortness of breath, cough, fever, chest pain at PAT RN visit. 09/10/19.  Presurgical COVID-19 test is in process. Anesthesia team to evaluate on the day of surgery.   VS: BP 140/84   Pulse 64   Temp 36.7 C (Oral)   Resp 19   Ht 5' 7.5" (1.715 m)   Wt 104.5 kg   SpO2 98%   BMI 35.55 kg/m     PROVIDERS: PCP is Dr. Gerlene Fee with the [facility] ([place], [place], [place] ?)   LABS: Preoperative labs noted. Na 128. See DISCUSSION. (all labs ordered are listed, but only abnormal results are displayed)  Labs Reviewed  BASIC METABOLIC PANEL - Abnormal; Notable for the following components:      Result Value   Sodium 128 (*)    Chloride 94 (*)    Glucose, Bld 109 (*)    BUN <5 (*)    All other components within normal limits  SURGICAL PCR SCREEN  CBC    EKG: 09/10/19: Sinus rhythm with 1st degree A-V block Otherwise normal ECG   CV: Denied prior stress test, echo, cardiac cath.   Past Medical History:  Diagnosis Date  . Anxiety   . Elevated liver enzymes    has had increases due to headache meds, but back to normal   . GERD (gastroesophageal reflux  disease)   . Headache(784.0)    MIGRAINES   . Hypertension   . Hypothyroidism   . PONV (postoperative nausea and vomiting)    DIFFICULTY WAKING UP   . PTSD (post-traumatic stress disorder)   . Sleep apnea 2019    Past Surgical History:  Procedure Laterality Date  . ANTERIOR CERVICAL DECOMP/DISCECTOMY FUSION  03/29/2011   Procedure: ANTERIOR CERVICAL DECOMPRESSION/DISCECTOMY FUSION 2 LEVELS;  Surgeon: Reinaldo Meeker, MD;  Location: [facility] NEURO [facility]S;  Service: Neurosurgery;  Laterality: N/A;  CERVICAL FOUR-FIVE, CERVICAL SIX-SEVEN  ANTERIOR CERVICAL DECOMPRESSION FUSION  WITH TRABECULAR METAL  AND PLATE  . APPENDECTOMY  03/2017  . DISTAL BICEPS TENDON REPAIR  2008   LEFT   . NECK SURGERY  1995   FUSION   . SHOULDER SURGERY  2000   RIGHT     LEFT  2004     MEDICATIONS: . butalbital-acetaminophen-caffeine (FIORICET) 50-325-40 MG tablet  . cetirizine (ZYRTEC ALLERGY) 10 MG tablet  . cyclobenzaprine (FLEXERIL) 5 MG tablet  . HYDROcodone-acetaminophen (NORCO) 5-325 MG per tablet  . hydrOXYzine (VISTARIL) 25 MG capsule  . levothyroxine (SYNTHROID, LEVOTHROID) 50 MCG tablet  . lisinopril (ZESTRIL) 20 MG tablet  . loratadine (CLARITIN) 10 MG tablet  . meloxicam (MOBIC) 15 MG tablet  . Multiple Vitamin (MULITIVITAMIN WITH MINERALS) TABS  . omeprazole (  PRILOSEC) 20 MG capsule  . OXcarbazepine (TRILEPTAL) 150 MG tablet  . Oxcarbazepine (TRILEPTAL) 300 MG tablet  . prazosin (MINIPRESS) 2 MG capsule  . propranolol ER (INDERAL LA) 80 MG 24 hr capsule  . Saw Palmetto 450 MG CAPS   No current facility-administered medications for this encounter.   Per current list: He is not currently taking Zyrtec, Flexeril, Norco, Mobic.    Shonna Chock, PA-C Surgical Short Stay/Anesthesiology [facility] Phone [phone] [facility] Phone [phone] 09/10/2019 12:38 PM

## 2019-09-11 ENCOUNTER — Ambulatory Visit (HOSPITAL_COMMUNITY)
Admission: RE | Admit: 2019-09-11 | Discharge: 2019-09-12 | Disposition: A | Discharge status: Home / Self Care | Payer: No Typology Code available for payment source | Attending: Neurosurgery | Admitting: Neurosurgery

## 2019-09-11 ENCOUNTER — Ambulatory Visit (HOSPITAL_COMMUNITY): Payer: No Typology Code available for payment source

## 2019-09-11 ENCOUNTER — Encounter (HOSPITAL_COMMUNITY)
Admission: RE | Disposition: A | Discharge status: Home / Self Care | Payer: Self-pay | Source: Home / Self Care | Attending: Neurosurgery

## 2019-09-11 ENCOUNTER — Encounter (HOSPITAL_COMMUNITY): Payer: Self-pay | Admitting: Neurosurgery

## 2019-09-11 ENCOUNTER — Other Ambulatory Visit: Payer: Self-pay

## 2019-09-11 ENCOUNTER — Ambulatory Visit (HOSPITAL_COMMUNITY): Payer: No Typology Code available for payment source | Admitting: Certified Registered Nurse Anesthetist

## 2019-09-11 ENCOUNTER — Ambulatory Visit (HOSPITAL_COMMUNITY): Payer: No Typology Code available for payment source | Admitting: Vascular Surgery

## 2019-09-11 DIAGNOSIS — F4310 Post-traumatic stress disorder, unspecified: Secondary | ICD-10-CM | POA: Insufficient documentation

## 2019-09-11 DIAGNOSIS — F17200 Nicotine dependence, unspecified, uncomplicated: Secondary | ICD-10-CM | POA: Diagnosis not present

## 2019-09-11 DIAGNOSIS — M48061 Spinal stenosis, lumbar region without neurogenic claudication: Secondary | ICD-10-CM | POA: Diagnosis present

## 2019-09-11 DIAGNOSIS — F419 Anxiety disorder, unspecified: Secondary | ICD-10-CM | POA: Insufficient documentation

## 2019-09-11 DIAGNOSIS — K219 Gastro-esophageal reflux disease without esophagitis: Secondary | ICD-10-CM | POA: Insufficient documentation

## 2019-09-11 DIAGNOSIS — G43909 Migraine, unspecified, not intractable, without status migrainosus: Secondary | ICD-10-CM | POA: Diagnosis not present

## 2019-09-11 DIAGNOSIS — M5416 Radiculopathy, lumbar region: Secondary | ICD-10-CM | POA: Diagnosis not present

## 2019-09-11 DIAGNOSIS — E039 Hypothyroidism, unspecified: Secondary | ICD-10-CM | POA: Diagnosis not present

## 2019-09-11 DIAGNOSIS — I10 Essential (primary) hypertension: Secondary | ICD-10-CM | POA: Diagnosis not present

## 2019-09-11 DIAGNOSIS — Z981 Arthrodesis status: Secondary | ICD-10-CM | POA: Insufficient documentation

## 2019-09-11 DIAGNOSIS — Z419 Encounter for procedure for purposes other than remedying health state, unspecified: Secondary | ICD-10-CM

## 2019-09-11 DIAGNOSIS — Z791 Long term (current) use of non-steroidal anti-inflammatories (NSAID): Secondary | ICD-10-CM | POA: Diagnosis not present

## 2019-09-11 DIAGNOSIS — G4730 Sleep apnea, unspecified: Secondary | ICD-10-CM | POA: Diagnosis not present

## 2019-09-11 DIAGNOSIS — Z515 Encounter for palliative care: Secondary | ICD-10-CM

## 2019-09-11 DIAGNOSIS — Z79899 Other long term (current) drug therapy: Secondary | ICD-10-CM | POA: Insufficient documentation

## 2019-09-11 HISTORY — PX: LUMBAR LAMINECTOMY/DECOMPRESSION MICRODISCECTOMY: SHX5026

## 2019-09-11 LAB — BASIC METABOLIC PANEL
Anion gap: 9 (ref 5–15)
BUN: 6 mg/dL (ref 6–20)
CO2: 23 mmol/L (ref 22–32)
Calcium: 9.1 mg/dL (ref 8.9–10.3)
Chloride: 95 mmol/L — ABNORMAL LOW (ref 98–111)
Creatinine, Ser: 0.69 mg/dL (ref 0.61–1.24)
GFR calc Af Amer: >60 mL/min (ref >60)
GFR calc non Af Amer: >60 mL/min (ref >60)
Glucose, Bld: 100 mg/dL — ABNORMAL HIGH (ref 70–99)
Potassium: 4.1 mmol/L (ref 3.5–5.1)
Sodium: 127 mmol/L — ABNORMAL LOW (ref 135–145)

## 2019-09-11 SURGERY — LUMBAR LAMINECTOMY/DECOMPRESSION MICRODISCECTOMY 1 LEVEL
Anesthesia: General | Site: Spine Lumbar | Laterality: Right

## 2019-09-11 MED ORDER — SODIUM CHLORIDE 0.9 % IV SOLN
250.0000 mL | INTRAVENOUS | Status: DC
  Administered 2019-09-11: 250 mL via INTRAVENOUS

## 2019-09-11 MED ORDER — BUPIVACAINE HCL (PF) 0.25 % IJ SOLN
INTRAMUSCULAR | Status: DC | PRN
  Administered 2019-09-11: 20 mL

## 2019-09-11 MED ORDER — CEFAZOLIN SODIUM-DEXTROSE 2-4 GM/100ML-% IV SOLN
2.0000 g | Freq: Three times a day (TID) | INTRAVENOUS | Status: AC
  Administered 2019-09-11 – 2019-09-12 (×2): 2 g via INTRAVENOUS
  Filled 2019-09-11 (×2): qty 100

## 2019-09-11 MED ORDER — CHLORHEXIDINE GLUCONATE CLOTH 2 % EX PADS: 6.0000 | MEDICATED_PAD | Freq: Once | CUTANEOUS | Status: DC

## 2019-09-11 MED ORDER — LISINOPRIL 20 MG PO TABS
20.0000 mg | ORAL_TABLET | Freq: Every day | ORAL | Status: DC
  Administered 2019-09-11: 20 mg via ORAL
  Filled 2019-09-11: qty 1

## 2019-09-11 MED ORDER — MIDAZOLAM HCL 5 MG/5ML IJ SOLN
INTRAMUSCULAR | Status: DC | PRN
  Administered 2019-09-11: 2 mg via INTRAVENOUS

## 2019-09-11 MED ORDER — CHLORHEXIDINE GLUCONATE 0.12 % MT SOLN: 15.0000 mL | Freq: Once | OROMUCOSAL | Status: AC

## 2019-09-11 MED ORDER — ACETAMINOPHEN 650 MG RE SUPP: 650.0000 mg | RECTAL | Status: DC | PRN

## 2019-09-11 MED ORDER — CEFAZOLIN SODIUM-DEXTROSE 2-4 GM/100ML-% IV SOLN
2.0000 g | INTRAVENOUS | Status: AC
  Administered 2019-09-11: 2 g via INTRAVENOUS

## 2019-09-11 MED ORDER — LACTATED RINGERS IV SOLN: INTRAVENOUS | Status: DC

## 2019-09-11 MED ORDER — PANTOPRAZOLE SODIUM 40 MG IV SOLR: 40.0000 mg | Freq: Every day | INTRAVENOUS | Status: DC

## 2019-09-11 MED ORDER — ALUM & MAG HYDROXIDE-SIMETH 200-200-20 MG/5ML PO SUSP: 30.0000 mL | Freq: Four times a day (QID) | ORAL | Status: DC | PRN

## 2019-09-11 MED ORDER — CEFAZOLIN SODIUM-DEXTROSE 2-4 GM/100ML-% IV SOLN
INTRAVENOUS | Status: AC
  Filled 2019-09-11: qty 100

## 2019-09-11 MED ORDER — ONDANSETRON HCL 4 MG PO TABS: 4.0000 mg | ORAL_TABLET | Freq: Four times a day (QID) | ORAL | Status: DC | PRN

## 2019-09-11 MED ORDER — LIDOCAINE 2% (20 MG/ML) 5 ML SYRINGE
INTRAMUSCULAR | Status: DC | PRN
  Administered 2019-09-11: 60 mg via INTRAVENOUS

## 2019-09-11 MED ORDER — OXYCODONE HCL 5 MG PO TABS
10.0000 mg | ORAL_TABLET | ORAL | Status: DC | PRN
  Administered 2019-09-11 – 2019-09-12 (×3): 10 mg via ORAL
  Filled 2019-09-11 (×3): qty 2

## 2019-09-11 MED ORDER — PHENYLEPHRINE 40 MCG/ML (10ML) SYRINGE FOR IV PUSH (FOR BLOOD PRESSURE SUPPORT)
PREFILLED_SYRINGE | INTRAVENOUS | Status: DC | PRN
  Administered 2019-09-11: 120 ug via INTRAVENOUS
  Administered 2019-09-11: 80 ug via INTRAVENOUS
  Administered 2019-09-11: 120 ug via INTRAVENOUS
  Administered 2019-09-11: 80 ug via INTRAVENOUS

## 2019-09-11 MED ORDER — LIDOCAINE-EPINEPHRINE 1 %-1:100000 IJ SOLN
INTRAMUSCULAR | Status: AC
  Filled 2019-09-11: qty 1

## 2019-09-11 MED ORDER — GLYCOPYRROLATE PF 0.2 MG/ML IJ SOSY
PREFILLED_SYRINGE | INTRAMUSCULAR | Status: DC | PRN
Start: 2019-09-11 — End: 2019-09-11
  Administered 2019-09-11: .1 mg via INTRAVENOUS

## 2019-09-11 MED ORDER — LEVOTHYROXINE SODIUM 50 MCG PO TABS
50.0000 ug | ORAL_TABLET | Freq: Every day | ORAL | Status: DC
  Filled 2019-09-11: qty 1
  Filled 2019-09-11: qty 2

## 2019-09-11 MED ORDER — THROMBIN 5000 UNITS EX SOLR
CUTANEOUS | Status: AC
  Filled 2019-09-11: qty 10000

## 2019-09-11 MED ORDER — DEXAMETHASONE SODIUM PHOSPHATE 10 MG/ML IJ SOLN
10.0000 mg | Freq: Once | INTRAMUSCULAR | Status: AC
  Administered 2019-09-11: 10 mg via INTRAVENOUS
  Filled 2019-09-11: qty 1

## 2019-09-11 MED ORDER — OXCARBAZEPINE 150 MG PO TABS
75.0000 mg | ORAL_TABLET | Freq: Three times a day (TID) | ORAL | Status: DC
  Administered 2019-09-11: 75 mg via ORAL
  Filled 2019-09-11 (×3): qty 0.5

## 2019-09-11 MED ORDER — BUPIVACAINE HCL (PF) 0.25 % IJ SOLN
INTRAMUSCULAR | Status: AC
  Filled 2019-09-11: qty 30

## 2019-09-11 MED ORDER — OXYCODONE HCL 5 MG/5ML PO SOLN: 5.0000 mg | Freq: Once | ORAL | Status: DC | PRN

## 2019-09-11 MED ORDER — SCOPOLAMINE 1 MG/3DAYS TD PT72
MEDICATED_PATCH | TRANSDERMAL | Status: DC | PRN
  Administered 2019-09-11: 1 via TRANSDERMAL

## 2019-09-11 MED ORDER — SODIUM CHLORIDE 0.9% FLUSH: 3.0000 mL | INTRAVENOUS | Status: DC | PRN

## 2019-09-11 MED ORDER — ONDANSETRON HCL 4 MG/2ML IJ SOLN
INTRAMUSCULAR | Status: DC | PRN
  Administered 2019-09-11: 4 mg via INTRAVENOUS

## 2019-09-11 MED ORDER — ORAL CARE MOUTH RINSE: 15.0000 mL | Freq: Once | OROMUCOSAL | Status: AC

## 2019-09-11 MED ORDER — OXCARBAZEPINE 150 MG PO TABS
75.0000 mg | ORAL_TABLET | Freq: Two times a day (BID) | ORAL | Status: DC
  Filled 2019-09-11: qty 0.5

## 2019-09-11 MED ORDER — SODIUM CHLORIDE 0.9 % IV SOLN
INTRAVENOUS | Status: DC | PRN
  Administered 2019-09-11: 500 mL

## 2019-09-11 MED ORDER — CHLORHEXIDINE GLUCONATE 0.12 % MT SOLN
OROMUCOSAL | Status: AC
  Administered 2019-09-11: 15 mL via OROMUCOSAL
  Filled 2019-09-11: qty 15

## 2019-09-11 MED ORDER — OXCARBAZEPINE 300 MG PO TABS
300.0000 mg | ORAL_TABLET | Freq: Three times a day (TID) | ORAL | Status: DC
  Administered 2019-09-11: 300 mg via ORAL
  Filled 2019-09-11 (×3): qty 1

## 2019-09-11 MED ORDER — HYDROMORPHONE HCL 1 MG/ML IJ SOLN: 0.5000 mg | INTRAMUSCULAR | Status: DC | PRN

## 2019-09-11 MED ORDER — SCOPOLAMINE 1 MG/3DAYS TD PT72
MEDICATED_PATCH | TRANSDERMAL | Status: AC
  Filled 2019-09-11: qty 1

## 2019-09-11 MED ORDER — SUGAMMADEX SODIUM 200 MG/2ML IV SOLN
INTRAVENOUS | Status: DC | PRN
  Administered 2019-09-11: 450 mg via INTRAVENOUS

## 2019-09-11 MED ORDER — OXCARBAZEPINE 300 MG PO TABS
300.0000 mg | ORAL_TABLET | Freq: Two times a day (BID) | ORAL | Status: DC
  Filled 2019-09-11: qty 1

## 2019-09-11 MED ORDER — FENTANYL CITRATE (PF) 250 MCG/5ML IJ SOLN
INTRAMUSCULAR | Status: AC
  Filled 2019-09-11: qty 5

## 2019-09-11 MED ORDER — HYDROXYZINE HCL 25 MG PO TABS: 25.0000 mg | ORAL_TABLET | Freq: Three times a day (TID) | ORAL | Status: DC | PRN

## 2019-09-11 MED ORDER — HYDROMORPHONE HCL 1 MG/ML IJ SOLN: 0.2500 mg | INTRAMUSCULAR | Status: DC | PRN

## 2019-09-11 MED ORDER — CYCLOBENZAPRINE HCL 10 MG PO TABS
10.0000 mg | ORAL_TABLET | Freq: Three times a day (TID) | ORAL | Status: DC | PRN
  Administered 2019-09-12: 10 mg via ORAL
  Filled 2019-09-11 (×2): qty 1

## 2019-09-11 MED ORDER — OXYCODONE HCL 5 MG PO TABS: 5.0000 mg | ORAL_TABLET | Freq: Once | ORAL | Status: DC | PRN

## 2019-09-11 MED ORDER — LORATADINE 10 MG PO TABS: 10.0000 mg | ORAL_TABLET | Freq: Every day | ORAL | Status: DC

## 2019-09-11 MED ORDER — SODIUM CHLORIDE 0.9% FLUSH: 3.0000 mL | Freq: Two times a day (BID) | INTRAVENOUS | Status: DC

## 2019-09-11 MED ORDER — KETOROLAC TROMETHAMINE 30 MG/ML IJ SOLN
INTRAMUSCULAR | Status: DC | PRN
  Administered 2019-09-11: 30 mg via INTRAVENOUS

## 2019-09-11 MED ORDER — PROPOFOL 10 MG/ML IV BOLUS
INTRAVENOUS | Status: DC | PRN
  Administered 2019-09-11: 20 mg via INTRAVENOUS
  Administered 2019-09-11: 200 mg via INTRAVENOUS
  Administered 2019-09-11: 30 mg via INTRAVENOUS

## 2019-09-11 MED ORDER — PROPOFOL 10 MG/ML IV BOLUS
INTRAVENOUS | Status: AC
  Filled 2019-09-11: qty 20

## 2019-09-11 MED ORDER — HEMOSTATIC AGENTS (NO CHARGE) OPTIME
TOPICAL | Status: DC | PRN
  Administered 2019-09-11: 1 via TOPICAL

## 2019-09-11 MED ORDER — OXCARBAZEPINE 300 MG PO TABS
450.0000 mg | ORAL_TABLET | Freq: Every day | ORAL | Status: DC
  Administered 2019-09-11: 450 mg via ORAL
  Filled 2019-09-11: qty 1

## 2019-09-11 MED ORDER — ROCURONIUM BROMIDE 10 MG/ML (PF) SYRINGE
PREFILLED_SYRINGE | INTRAVENOUS | Status: DC | PRN
  Administered 2019-09-11: 30 mg via INTRAVENOUS
  Administered 2019-09-11: 70 mg via INTRAVENOUS

## 2019-09-11 MED ORDER — 0.9 % SODIUM CHLORIDE (POUR BTL) OPTIME
TOPICAL | Status: DC | PRN
  Administered 2019-09-11: 1000 mL

## 2019-09-11 MED ORDER — ACETAMINOPHEN 500 MG PO TABS
ORAL_TABLET | ORAL | Status: AC
  Filled 2019-09-11: qty 2

## 2019-09-11 MED ORDER — ACETAMINOPHEN 325 MG PO TABS
650.0000 mg | ORAL_TABLET | ORAL | Status: DC | PRN
  Administered 2019-09-11 – 2019-09-12 (×2): 650 mg via ORAL
  Filled 2019-09-11 (×2): qty 2

## 2019-09-11 MED ORDER — MENTHOL 3 MG MT LOZG: 1.0000 | LOZENGE | OROMUCOSAL | Status: DC | PRN

## 2019-09-11 MED ORDER — LIDOCAINE-EPINEPHRINE 1 %-1:100000 IJ SOLN
INTRAMUSCULAR | Status: DC | PRN
  Administered 2019-09-11: 10 mL

## 2019-09-11 MED ORDER — PANTOPRAZOLE SODIUM 40 MG PO TBEC
40.0000 mg | DELAYED_RELEASE_TABLET | Freq: Every day | ORAL | Status: DC
  Administered 2019-09-11: 40 mg via ORAL
  Filled 2019-09-11 (×2): qty 1

## 2019-09-11 MED ORDER — PHENYLEPHRINE HCL (PRESSORS) 10 MG/ML IV SOLN
INTRAVENOUS | Status: AC
  Filled 2019-09-11: qty 1

## 2019-09-11 MED ORDER — GLYCOPYRROLATE PF 0.2 MG/ML IJ SOSY
PREFILLED_SYRINGE | INTRAMUSCULAR | Status: AC
  Filled 2019-09-11: qty 1

## 2019-09-11 MED ORDER — PROPRANOLOL HCL ER 80 MG PO CP24
80.0000 mg | ORAL_CAPSULE | Freq: Every day | ORAL | Status: DC
  Filled 2019-09-11: qty 1

## 2019-09-11 MED ORDER — AMISULPRIDE (ANTIEMETIC) 5 MG/2ML IV SOLN: 10.0000 mg | Freq: Once | INTRAVENOUS | Status: DC | PRN

## 2019-09-11 MED ORDER — ONDANSETRON HCL 4 MG/2ML IJ SOLN: 4.0000 mg | Freq: Four times a day (QID) | INTRAMUSCULAR | Status: DC | PRN

## 2019-09-11 MED ORDER — ACETAMINOPHEN 325 MG PO TABS
ORAL_TABLET | ORAL | Status: DC | PRN
Start: 2019-09-11 — End: 2019-09-11
  Administered 2019-09-11: 1000 mg via ORAL

## 2019-09-11 MED ORDER — ROCURONIUM BROMIDE 10 MG/ML (PF) SYRINGE
PREFILLED_SYRINGE | INTRAVENOUS | Status: AC
  Filled 2019-09-11: qty 10

## 2019-09-11 MED ORDER — PHENYLEPHRINE HCL-NACL 10-0.9 MG/250ML-% IV SOLN
INTRAVENOUS | Status: DC | PRN
  Administered 2019-09-11: 40 ug/min via INTRAVENOUS
  Administered 2019-09-11: 80 ug/min via INTRAVENOUS

## 2019-09-11 MED ORDER — THROMBIN 5000 UNITS EX SOLR
CUTANEOUS | Status: DC | PRN
  Administered 2019-09-11 (×2): 5000 [IU] via TOPICAL

## 2019-09-11 MED ORDER — BUTALBITAL-APAP-CAFFEINE 50-325-40 MG PO TABS: 1.0000 | ORAL_TABLET | Freq: Four times a day (QID) | ORAL | Status: DC | PRN

## 2019-09-11 MED ORDER — PRAZOSIN HCL 2 MG PO CAPS
2.0000 mg | ORAL_CAPSULE | Freq: Every day | ORAL | Status: DC
  Administered 2019-09-11: 2 mg via ORAL
  Filled 2019-09-11: qty 1

## 2019-09-11 MED ORDER — EPHEDRINE SULFATE-NACL 50-0.9 MG/10ML-% IV SOSY
PREFILLED_SYRINGE | INTRAVENOUS | Status: DC | PRN
  Administered 2019-09-11: 5 mg via INTRAVENOUS
  Administered 2019-09-11 (×2): 10 mg via INTRAVENOUS

## 2019-09-11 MED ORDER — MIDAZOLAM HCL 2 MG/2ML IJ SOLN
INTRAMUSCULAR | Status: AC
  Filled 2019-09-11: qty 2

## 2019-09-11 MED ORDER — FENTANYL CITRATE (PF) 250 MCG/5ML IJ SOLN
INTRAMUSCULAR | Status: DC | PRN
  Administered 2019-09-11: 50 ug via INTRAVENOUS
  Administered 2019-09-11: 150 ug via INTRAVENOUS

## 2019-09-11 MED ORDER — ONDANSETRON HCL 4 MG/2ML IJ SOLN
INTRAMUSCULAR | Status: AC
  Filled 2019-09-11: qty 2

## 2019-09-11 MED ORDER — PHENOL 1.4 % MT LIQD: 1.0000 | OROMUCOSAL | Status: DC | PRN

## 2019-09-11 MED ORDER — PROMETHAZINE HCL 25 MG/ML IJ SOLN: 6.2500 mg | INTRAMUSCULAR | Status: DC | PRN

## 2019-09-11 SURGICAL SUPPLY — 55 items
BAG DECANTER FOR FLEXI CONT (MISCELLANEOUS) ×2 IMPLANT
BAND RUBBER #18 3X1/16 STRL (MISCELLANEOUS) ×4 IMPLANT
BENZOIN TINCTURE PRP APPL 2/3 (GAUZE/BANDAGES/DRESSINGS) ×2 IMPLANT
BLADE CLIPPER SURG (BLADE) IMPLANT
BLADE SURG 11 STRL SS (BLADE) ×2 IMPLANT
BUR CUTTER 7.0 ROUND (BURR) ×2 IMPLANT
BUR MATCHSTICK NEURO 3.0 LAGG (BURR) ×2 IMPLANT
CANISTER SUCT 3000ML PPV (MISCELLANEOUS) ×2 IMPLANT
CARTRIDGE OIL MAESTRO DRILL (MISCELLANEOUS) ×1 IMPLANT
COVER WAND RF STERILE (DRAPES) IMPLANT
DECANTER SPIKE VIAL GLASS SM (MISCELLANEOUS) ×2 IMPLANT
DERMABOND ADVANCED (GAUZE/BANDAGES/DRESSINGS) ×1
DERMABOND ADVANCED .7 DNX12 (GAUZE/BANDAGES/DRESSINGS) ×1 IMPLANT
DIFFUSER DRILL AIR PNEUMATIC (MISCELLANEOUS) ×2 IMPLANT
DRAPE HALF SHEET 40X57 (DRAPES) IMPLANT
DRAPE LAPAROTOMY 100X72X124 (DRAPES) ×2 IMPLANT
DRAPE MICROSCOPE LEICA (MISCELLANEOUS) ×2 IMPLANT
DRAPE SURG 17X23 STRL (DRAPES) ×2 IMPLANT
DRSG OPSITE POSTOP 4X6 (GAUZE/BANDAGES/DRESSINGS) ×2 IMPLANT
DURAPREP 26ML APPLICATOR (WOUND CARE) ×2 IMPLANT
ELECT REM PT RETURN 9FT ADLT (ELECTROSURGICAL) ×2
ELECTRODE REM PT RTRN 9FT ADLT (ELECTROSURGICAL) ×1 IMPLANT
GAUZE 4X4 16PLY RFD (DISPOSABLE) IMPLANT
GAUZE SPONGE 4X4 12PLY STRL (GAUZE/BANDAGES/DRESSINGS) IMPLANT
GLOVE BIO SURGEON STRL SZ 6.5 (GLOVE) ×2 IMPLANT
GLOVE BIO SURGEON STRL SZ7 (GLOVE) ×2 IMPLANT
GLOVE BIO SURGEON STRL SZ8 (GLOVE) ×2 IMPLANT
GLOVE BIOGEL PI IND STRL 7.0 (GLOVE) ×2 IMPLANT
GLOVE BIOGEL PI IND STRL 7.5 (GLOVE) ×1 IMPLANT
GLOVE BIOGEL PI INDICATOR 7.0 (GLOVE) ×2
GLOVE BIOGEL PI INDICATOR 7.5 (GLOVE) ×1
GLOVE ECLIPSE 7.0 STRL STRAW (GLOVE) ×2 IMPLANT
GLOVE EXAM NITRILE XL STR (GLOVE) IMPLANT
GLOVE INDICATOR 8.5 STRL (GLOVE) ×2 IMPLANT
GOWN STRL REUS W/ TWL LRG LVL3 (GOWN DISPOSABLE) ×2 IMPLANT
GOWN STRL REUS W/ TWL XL LVL3 (GOWN DISPOSABLE) ×2 IMPLANT
GOWN STRL REUS W/TWL 2XL LVL3 (GOWN DISPOSABLE) IMPLANT
GOWN STRL REUS W/TWL LRG LVL3 (GOWN DISPOSABLE) ×4
GOWN STRL REUS W/TWL XL LVL3 (GOWN DISPOSABLE) ×4
KIT BASIN OR (CUSTOM PROCEDURE TRAY) ×2 IMPLANT
KIT TURNOVER KIT B (KITS) ×2 IMPLANT
NEEDLE HYPO 22GX1.5 SAFETY (NEEDLE) ×2 IMPLANT
NEEDLE SPNL 22GX3.5 QUINCKE BK (NEEDLE) ×2 IMPLANT
NS IRRIG 1000ML POUR BTL (IV SOLUTION) ×2 IMPLANT
OIL CARTRIDGE MAESTRO DRILL (MISCELLANEOUS) ×2
PACK LAMINECTOMY NEURO (CUSTOM PROCEDURE TRAY) ×2 IMPLANT
SPONGE SURGIFOAM ABS GEL SZ50 (HEMOSTASIS) ×2 IMPLANT
STRIP CLOSURE SKIN 1/2X4 (GAUZE/BANDAGES/DRESSINGS) ×2 IMPLANT
SUT VIC AB 0 CT1 18XCR BRD8 (SUTURE) ×1 IMPLANT
SUT VIC AB 0 CT1 8-18 (SUTURE) ×2
SUT VIC AB 2-0 CT1 18 (SUTURE) ×2 IMPLANT
SUT VICRYL 4-0 PS2 18IN ABS (SUTURE) ×2 IMPLANT
TOWEL GREEN STERILE (TOWEL DISPOSABLE) ×2 IMPLANT
TOWEL GREEN STERILE FF (TOWEL DISPOSABLE) ×2 IMPLANT
WATER STERILE IRR 1000ML POUR (IV SOLUTION) ×2 IMPLANT

## 2019-09-11 NOTE — Transfer of Care (Signed)
Immediate Anesthesia Transfer of Care Note  Patient: Manuel Pearson [facility]  Procedure(s) Performed: Laminectomy and Foraminotomy - Lumbar Two-Three - right (Right Spine Lumbar)  Patient Location: PACU  Anesthesia Type:General  Level of Consciousness: drowsy  Airway & Oxygen Therapy: Patient Spontanous Breathing and Patient connected to face mask oxygen  Post-op Assessment: Report given to RN and Post -op Vital signs reviewed and stable  Post vital signs: Reviewed and stable  Last Vitals:  Vitals Value Taken Time  BP 130/80 09/11/19 1623  Temp    Pulse 66 09/11/19 1627  Resp 11 09/11/19 1627  SpO2 97 % 09/11/19 1627  Vitals shown include unvalidated device data.  Last Pain:  Vitals:   09/11/19 1625  TempSrc:   PainSc: (P) Asleep         Complications: No complications documented.

## 2019-09-11 NOTE — Anesthesia Procedure Notes (Signed)
Procedure Name: Intubation Performed by: Rayden Scheper H, CRNA Pre-anesthesia Checklist: Patient identified, Emergency Drugs available, Suction available and Patient being monitored Patient Re-evaluated:Patient Re-evaluated prior to induction Oxygen Delivery Method: Circle System Utilized Preoxygenation: Pre-oxygenation with 100% oxygen Induction Type: IV induction Ventilation: Mask ventilation without difficulty Laryngoscope Size: Miller and 2 Grade View: Grade I Tube type: Oral Tube size: 7.5 mm Number of attempts: 1 Airway Equipment and Method: Stylet and Oral airway Placement Confirmation: ETT inserted through vocal cords under direct vision,  positive ETCO2 and breath sounds checked- equal and bilateral Secured at: 22 cm Tube secured with: Tape Dental Injury: Teeth and Oropharynx as per pre-operative assessment        

## 2019-09-11 NOTE — Op Note (Signed)
Preoperative diagnosis: Lumbar spinal stenosis L2-3 with right-sided L3 radiculopathy  Postoperative diagnosis: Same  Procedure: Decompressive lumbar laminotomy on the right at L2-3 with microdissection of the right L3 nerve root and microscopic foraminotomy with partial medial facetectomy  Surgeon: Jillyn Hidden Sonam Huelsmann  Assistant: Julien Girt  Anesthesia: General  EBL: Minimal  HPI: 54 year old gentleman progressive worsening back right hip and leg pain into his top of his quad consistent with an L3 radicular pattern at some weakness on hip flexor work-up revealed severe spinal stenosis progressive at L2-3 due to patient progression of clinical syndrome imaging findings and failed conservative treatment I recommended decompressive laminectomy at L2-3 on the right.  I extensively went over the risks and benefits of that operation with him as well as perioperative course expectations of outcome and alternatives of surgery and he understood and agreed to proceed forward.  Operative procedure: Patient was brought into the OR was due to general anesthesia positioned prone the Wilson frame his back was prepped and draped in routine sterile fashion preoperative x-ray localized the appropriate level so after infiltration of 10 cc lidocaine with epi midline incision was made and Bovie elect cautery was used take down the subcutaneous tissue and subperiosteal dissection was carried lamina of L2 and L3.  Intraoperative x-ray confirmed identification appropriate level so then on the right side the lamina medial facet complex of L2 and the superior aspect of the lamina of L3 was drilled out with a high-speed drill laminotomy was begun with a 2 and 3 Miller Kerrison punch.  Ligament flow was noted to be markedly hypertrophied and severe hourglass compression of thecal sac was causing severe stenosis.  The plane between the dura and the underneath surface of the facet joint was developed with a 4 Penfield and utilizing a  2 and 3 Miller Kerrison punch the medial facet was under Bitton.  The operating microscope was draped and brought into the field and microscope relation I performed a foraminotomy of the right L3 nerve root extended the laminotomy inferior to 3 until the thecal sac resumed his normal anatomic configuration after being severely compressed right at the 2 3 junction.  I inspected the disc base was felt not to be significantly bulging the foramen of L3 was widely patent needle easily excepting a coronary dilator as well as the foramen of L2.  Wounds are copiously irrigated with 6 hemostasis was maintained Gelfoam was ON top of dura the muscle fascia approximate layers with Vicryl and the skin was closed with a running 4 subcuticular Dermabond benzoin Steri-Strips and a sterile dressing was applied and patient recovery in stable condition.  At the end the case all needle counts and sponge counts were correct.

## 2019-09-11 NOTE — Anesthesia Preprocedure Evaluation (Addendum)
Anesthesia Evaluation  Patient identified by MRN, date of birth, ID band Patient awake    Reviewed: Allergy & Precautions, NPO status , Patient's Chart, lab work & pertinent test results  History of Anesthesia Complications (+) PONV and history of anesthetic complications  Airway Mallampati: III  TM Distance: >3 FB Neck ROM: Full   Comment: Hx 2 neck surgeries Dental no notable dental hx. (+) Teeth Intact, Dental Advisory Given, Missing,    Pulmonary sleep apnea and Continuous Positive Airway Pressure Ventilation , Current Smoker,  Still smoking, 40 pack year history- 1ppd   Pulmonary exam normal breath sounds clear to auscultation       Cardiovascular hypertension, Pt. on medications and Pt. on home beta blockers Normal cardiovascular exam Rhythm:Regular Rate:Normal     Neuro/Psych  Headaches, PSYCHIATRIC DISORDERS (PTSD) Anxiety    GI/Hepatic Neg liver ROS, GERD  Medicated and Controlled,  Endo/Other  Hypothyroidism Obesity BMI 36  Renal/GU negative Renal ROS  negative genitourinary   Musculoskeletal Lumbar stenosis   Abdominal (+) + obese,   Peds  Hematology negative hematology ROS (+)   Anesthesia Other Findings   Reproductive/Obstetrics negative OB ROS                            Anesthesia Physical Anesthesia Plan  ASA: III  Anesthesia Plan: General   Post-op Pain Management:    Induction: Intravenous  PONV Risk Score and Plan: 2 and Ondansetron, Dexamethasone, Midazolam and Treatment may vary due to age or medical condition  Airway Management Planned: Oral ETT and Video Laryngoscope Planned  Additional Equipment: None  Intra-op Plan:   Post-operative Plan: Extubation in OR  Informed Consent: I have reviewed the patients History and Physical, chart, labs and discussed the procedure including the risks, benefits and alternatives for the proposed anesthesia with the patient  or authorized representative who has indicated his/her understanding and acceptance.     Dental advisory given  Plan Discussed with: CRNA  Anesthesia Plan Comments:         Anesthesia Quick Evaluation

## 2019-09-11 NOTE — H&P (Signed)
Manuel Pearson is an 54 y.o. male.   Chief Complaint: back and leg pain right HPI: sever back and leg pain on right radiating in a L3 distribution.  Work up revealed severe stenosis at L23 on the right.  He failed all fprms of conservative treatment and I recommended decompressionat L23 on the right.  I extensively went over the risks and benefits, perioperative course, expectations of outcome, and alternaitves and they agreed to proceed forward.  Past Medical History:  Diagnosis Date  . Anxiety   . Elevated liver enzymes    has had increases due to headache meds, but back to normal   . GERD (gastroesophageal reflux disease)   . Headache(784.0)    MIGRAINES   . Hypertension   . Hypothyroidism   . PONV (postoperative nausea and vomiting)    DIFFICULTY WAKING UP   . PTSD (post-traumatic stress disorder)   . Sleep apnea 2019    Past Surgical History:  Procedure Laterality Date  . ANTERIOR CERVICAL DECOMP/DISCECTOMY FUSION  03/29/2011   Procedure: ANTERIOR CERVICAL DECOMPRESSION/DISCECTOMY FUSION 2 LEVELS;  Surgeon: Reinaldo Meeker, MD;  Location: [facility];  Service: Neurosurgery;  Laterality: N/A;  CERVICAL FOUR-FIVE, CERVICAL SIX-SEVEN  ANTERIOR CERVICAL DECOMPRESSION FUSION  WITH TRABECULAR METAL  AND PLATE  . APPENDECTOMY  03/2017  . DISTAL BICEPS TENDON REPAIR  2008   LEFT   . NECK SURGERY  1995   FUSION   . SHOULDER SURGERY  2000   RIGHT     LEFT  2004     History reviewed. No pertinent family history. Social History:  reports that he has been smoking. He has a 40.00 pack-year smoking history. He has never used smokeless tobacco. He reports current alcohol use. He reports current drug use. Drug: Marijuana.  Allergies: No Known Allergies  Medications Prior to Admission  Medication Sig Dispense Refill  . hydrOXYzine (VISTARIL) 25 MG capsule Take 25 mg by mouth 3 (three) times daily as needed for anxiety.     Marland Kitchen levothyroxine (SYNTHROID, LEVOTHROID) 50 MCG tablet Take 50 mcg  by mouth daily before breakfast.    . lisinopril (ZESTRIL) 20 MG tablet Take 20 mg by mouth daily.    Marland Kitchen loratadine (CLARITIN) 10 MG tablet Take 10 mg by mouth daily at 12 noon.    Marland Kitchen omeprazole (PRILOSEC) 20 MG capsule Take 20 mg by mouth daily.    . OXcarbazepine (TRILEPTAL) 150 MG tablet Take 75 mg by mouth See admin instructions. Take 75 mg by mouth twice daily along with 300 mg tablets    . Oxcarbazepine (TRILEPTAL) 300 MG tablet Take 300-450 mg by mouth See admin instructions. Take 300 mg by mouth twice daily and 450 mg at night    . prazosin (MINIPRESS) 2 MG capsule Take 2 mg by mouth at bedtime.     . propranolol ER (INDERAL LA) 80 MG 24 hr capsule Take 80 mg by mouth in the morning and at bedtime.     . Saw Palmetto 450 MG CAPS Take 450 mg by mouth in the morning, at noon, in the evening, and at bedtime.    . butalbital-acetaminophen-caffeine (FIORICET) 50-325-40 MG tablet Take 1 tablet by mouth every 6 (six) hours as needed for migraine.     . cetirizine (ZYRTEC ALLERGY) 10 MG tablet Take 10 mg by mouth at bedtime. (Patient not taking: Reported on 09/04/2019)    . cyclobenzaprine (FLEXERIL) 5 MG tablet TAKE 1 TABLET THREE TIMES A DAY AS NEEDED FOR  MUSCLE SPASMS (Patient not taking: Reported on 09/04/2019) 30 tablet 0  . HYDROcodone-acetaminophen (NORCO) 5-325 MG per tablet Take 1 tablet by mouth every 8 (eight) hours as needed for moderate pain. Take with stool softener (Patient not taking: Reported on 09/04/2019) 30 tablet 0  . meloxicam (MOBIC) 15 MG tablet Take 1/2 to 1 tab daily with food. No other NSAIDs. (Patient not taking: Reported on 09/04/2019) 30 tablet 0  . Multiple Vitamin (MULITIVITAMIN WITH MINERALS) TABS Take 1 tablet by mouth daily. (Patient not taking: Reported on 09/04/2019)      Results for orders placed or performed during the hospital encounter of 09/10/19 (from the past 48 hour(s))  SARS CORONAVIRUS 2 (TAT 6-24 HRS) Nasopharyngeal Nasopharyngeal Swab     Status: None    Collection Time: 09/10/19 10:04 AM   Specimen: Nasopharyngeal Swab  Result Value Ref Range   SARS Coronavirus 2 NEGATIVE NEGATIVE    Comment: (NOTE) SARS-CoV-2 target nucleic acids are NOT DETECTED.  The SARS-CoV-2 RNA is generally detectable in upper and lower respiratory specimens during the acute phase of infection. Negative results do not preclude SARS-CoV-2 infection, do not rule out co-infections with other pathogens, and should not be used as the sole basis for treatment or other patient management decisions. Negative results must be combined with clinical observations, patient history, and epidemiological information. The expected result is Negative.  Fact Sheet for Patients: HairSlick.no  Fact Sheet for Healthcare Providers: quierodirigir.com  This test is not yet approved or cleared by the [place] FDA and  has been authorized for detection and/or diagnosis of SARS-CoV-2 by FDA under an Emergency Use Authorization (EUA). This EUA will remain  in effect (meaning this test can be used) for the duration of the COVID-19 declaration under Se ction 564(b)(1) of the Act, 21 U.S.C. section 360bbb-3(b)(1), unless the authorization is terminated or revoked sooner.  Performed at [facility], [address], [place], [place] [address]    No results found.  Review of Systems  Musculoskeletal: Positive for back pain.  Neurological: Positive for numbness.    Blood pressure (!) 152/87, pulse 69, temperature 98 F (36.7 C), temperature source Oral, resp. rate 20, height 5' 7.5" (1.715 m), weight 104.3 kg, SpO2 99 %. Physical Exam HENT:     Head: Normocephalic.  Cardiovascular:     Rate and Rhythm: Normal rate.  Pulmonary:     Effort: Pulmonary effort is normal.     Breath sounds: Normal breath sounds.  Abdominal:     General: Abdomen is flat. Bowel sounds are normal.  Skin:    General: Skin is warm.   Neurological:     Mental Status: He is alert.     Comments: 5/5 IP, quads hamstrimgs gasctroc. AT,[facility]      Assessment/Plan 54 yo presents ofr L23 DLL right.  Mariam Dollar, MD 09/11/2019, 1:26 PM

## 2019-09-11 NOTE — Progress Notes (Signed)
CPAP set up and patient placed on auto titrate settings (pt does not know home settings) via [facility]. Tolerating well at this time.

## 2019-09-12 ENCOUNTER — Encounter (HOSPITAL_COMMUNITY): Payer: Self-pay | Admitting: Neurosurgery

## 2019-09-12 DIAGNOSIS — M48061 Spinal stenosis, lumbar region without neurogenic claudication: Secondary | ICD-10-CM | POA: Diagnosis not present

## 2019-09-12 DIAGNOSIS — Z515 Encounter for palliative care: Secondary | ICD-10-CM

## 2019-09-12 MED ORDER — HYDROCODONE-ACETAMINOPHEN 5-325 MG PO TABS: 1.0000 | ORAL_TABLET | ORAL | 0 refills | Status: AC | PRN

## 2019-09-12 MED ORDER — METHOCARBAMOL 500 MG PO TABS: 500.0000 mg | ORAL_TABLET | Freq: Four times a day (QID) | ORAL | 0 refills | Status: DC

## 2019-09-12 NOTE — Evaluation (Addendum)
Physical Therapy Evaluation and discharge Patient Details Name: Manuel Pearson MRN: [id number] DOB: [birth year] Today's Date: 09/12/2019   History of Present Illness  Pt is a 54 year old man admitted for decompressive laminotomy L 2-3. PMH: HTN, sleep apnea, ACDF, hypothyroidism, PTSD, anxiety.  Clinical Impression  Patient evaluated by Physical Therapy with no further acute PT needs identified. All education has been completed and the patient has no further questions. Pt was able to demonstrate transfers and ambulation with gross modified independence or light supervision for safety. Pt was educated on precautions, appropriate activity progression, and car transfer. See below for any follow-up Physical Therapy or equipment needs. PT is signing off. Thank you for this referral.     Follow Up Recommendations No PT follow up;Supervision for mobility/OOB    Equipment Recommendations  None recommended by PT    Recommendations for Other Services       Precautions / Restrictions Precautions Precautions: Back Precaution Booklet Issued: Yes (comment) Precaution Comments: reviewed back precautions with pt and wife Restrictions Weight Bearing Restrictions: No      Mobility  Bed Mobility Overal bed mobility: Modified Independent             General bed mobility comments: HOB flat and rails lowered to simulate home environment  Transfers Overall transfer level: Needs assistance Equipment used: None Transfers: Sit to/from Stand Sit to Stand: Supervision         General transfer comment: Light supervision for safety, slow to rise from EOB  Ambulation/Gait Ambulation/Gait assistance: Modified independent (Device/Increase time) Gait Distance (Feet): 350 Feet Assistive device: None Gait Pattern/deviations: Step-through pattern;Decreased stride length Gait velocity: Decreased Gait velocity interpretation: <1.8 ft/sec, indicate of risk for recurrent falls General Gait Details:  VC's for improved posture. Pt generally steady without AD.   Stairs            Wheelchair Mobility    Modified Rankin (Stroke Patients Only)       Balance Overall balance assessment: Mild deficits observed, not formally tested                                           Pertinent Vitals/Pain Pain Assessment: Faces Faces Pain Scale: Hurts little more Pain Location: back Pain Descriptors / Indicators: Sore;Discomfort Pain Intervention(s): Limited activity within patient's tolerance;Monitored during session;Repositioned    Home Living Family/patient expects to be discharged to:: Private residence Living Arrangements: Spouse/significant other Available Help at Discharge: Family;Available 24 hours/day Type of Home: House Home Access: Stairs to enter   [employer] of Steps: 1+1 Home Layout: One level Home Equipment: Shower seat - built in;Adaptive equipment      Prior Function Level of Independence: Independent               Hand Dominance   Dominant Hand: Right    Extremity/Trunk Assessment   Upper Extremity Assessment Upper Extremity Assessment: Defer to OT evaluation    Lower Extremity Assessment Lower Extremity Assessment: Generalized weakness    Cervical / Trunk Assessment Cervical / Trunk Assessment: Other exceptions Cervical / Trunk Exceptions: s/p surgery  Communication   Communication: No difficulties  Cognition Arousal/Alertness: Awake/alert Behavior During Therapy: WFL for tasks assessed/performed Overall Cognitive Status: Within Functional Limits for tasks assessed  General Comments      Exercises     Assessment/Plan    PT Assessment Patent does not need any further PT services  PT Problem List         PT Treatment Interventions      PT Goals (Current goals can be found in the Care Plan section)  Acute Rehab PT Goals Patient Stated Goal:  return to PLOF PT Goal Formulation: All assessment and education complete, DC therapy    Frequency     Barriers to discharge        Co-evaluation               AM-PAC PT "6 Clicks" Mobility  Outcome Measure Help needed turning from your back to your side while in a flat bed without using bedrails?: None Help needed moving from lying on your back to sitting on the side of a flat bed without using bedrails?: None Help needed moving to and from a bed to a chair (including a wheelchair)?: None Help needed standing up from a chair using your arms (e.g., wheelchair or bedside chair)?: None Help needed to walk in hospital room?: None Help needed climbing 3-5 steps with a railing? : A Little 6 Click Score: 23    End of Session   Activity Tolerance: Patient tolerated treatment well Patient left: in chair;with call bell/phone within reach;with family/visitor present Nurse Communication: Mobility status PT Visit Diagnosis: Pain Pain - part of body:  (back)    Time: 5852-7782 PT Time Calculation (min) (ACUTE ONLY): 16 min   Charges:   PT Evaluation $PT Eval Low Complexity: 1 Low          Conni Slipper, PT, DPT Acute Rehabilitation Services Pager: [phone] Office: [phone]   Manuel Pearson 09/12/2019, 10:40 AM

## 2019-09-12 NOTE — Discharge Instructions (Signed)
Wound Care Keep the incision clean and dry remove the outer dressing in 2 days, leave the Steri-Strips intact. Wrap with Saran wrap for showers only Do not put any creams, lotions, or ointments on incision. Leave steri-strips on back.  They will fall off by themselves.  Activity Walk each and every day, increasing distance each day. No lifting greater than 5 lbs.  No lifting no bending no twisting no driving or riding a car unless coming back and forth to see me.  Diet Resume your normal diet.   Return to Work Will be discussed at you follow up appointment.  Call Your Doctor If Any of These Occur Redness, drainage, or swelling at the wound.  Temperature greater than 101 degrees. Severe pain not relieved by pain medication. Incision starts to come apart.  Follow Up Appt Call today for appointment in 1-2 weeks ([phone]) or for problems.  If you have any hardware placed in your spine, you will need an x-ray before your appointment.   

## 2019-09-12 NOTE — Discharge Summary (Addendum)
  Physician Discharge Summary  Patient ID: OLLIVANDER SEE MRN: [id number] DOB/AGE: 1966-03-13 54 y.o. Estimated body mass index is 35.49 kg/m as calculated from the following:   Height as of this encounter: 5' 7.5" (1.715 m).   Weight as of this encounter: 104.3 kg.   Admit date: 09/11/2019 Discharge date: 09/12/2019  Admission Diagnoses: Lumbar spondylosis L2-3 and stenosis  Discharge Diagnoses: Same Active Problems:   Spinal stenosis of lumbar region   Hospice care patient   Discharged Condition: good  Hospital Course: Patient was met hospital 1 decompressive laminotomy at L2-3 on the right postoperative patient did very well complete resolution of preoperative radicular pain.  Was ambulating voiding spontaneously tolerating regular diet and stable for discharge home.  Patient be discharged scheduled follow-up in 1 to 2 weeks.  Consults: Strength out of 5 wound clean dry and intact Significant Diagnostic Studies: Treatments: Discharge Exam: Blood pressure 96/69, pulse 64, temperature 97.6 F (36.4 C), temperature source Oral, resp. rate 17, height 5' 7.5" (1.715 m), weight 104.3 kg, SpO2 98 %.   Disposition: Home     Follow-up Information    Kary Kos, MD Follow up.   Specialty: Neurosurgery Contact information: [address] [place] [address] [place] [address] [phone]               Signed: Ocie Cornfield [facility] 09/12/2019, 7:59 AM

## 2019-09-12 NOTE — Plan of Care (Signed)
Patient alert and oriented, mae's well, voiding adequate amount of urine, swallowing without difficulty, no c/o pain at time of discharge. Patient discharged home with family. Script and discharged instructions given to patient. Patient and family stated understanding of instructions given. Patient has an appointment with Dr. Cram 

## 2019-09-12 NOTE — Evaluation (Signed)
Occupational Therapy Evaluation Patient Details Name: Manuel Pearson MRN: [id number] DOB: [birth year] Today's Date: 09/12/2019    History of Present Illness Pt is a 54 year old man admitted for decompressive laminotomy L 2-3. PMH: HTN, sleep apnea, ACDF, hypothyroidism, PTSD, anxiety.   Clinical Impression   All education completed. Wife and patient verbalizing understanding. No further OT needs.   Follow Up Recommendations  No OT follow up    Equipment Recommendations  None recommended by OT    Recommendations for Other Services       Precautions / Restrictions Precautions Precautions: Back Precaution Booklet Issued: Yes (comment) Precaution Comments: reviewed back precautions with pt and wife      Mobility Bed Mobility               General bed mobility comments: pt received in chair  Transfers Overall transfer level: Needs assistance   Transfers: Sit to/from Stand Sit to Stand: Supervision         General transfer comment: supervision for safety, slow to rise from chair    Balance                                           ADL either performed or assessed with clinical judgement   ADL Overall ADL's : Needs assistance/impaired Eating/Feeding: Independent   Grooming: Modified independent;Standing Grooming Details (indicate cue type and reason): educated in 2 cup method for toothbrushing and washcloth for face Upper Body Bathing: Minimal assistance;Standing Upper Body Bathing Details (indicate cue type and reason): recommended long handled bath sponge for back Lower Body Bathing: Minimal assistance;Sit to/from stand Lower Body Bathing Details (indicate cue type and reason): recommended use of long handled bath sponge and use of his reacher Upper Body Dressing : Set up;Sitting   Lower Body Dressing: Minimal assistance;Sit to/from stand Lower Body Dressing Details (indicate cue type and reason): educated in use of reacher, will rely on  wife for socks Toilet Transfer: Supervision/safety;Ambulation     Toileting - Clothing Manipulation Details (indicate cue type and reason): instructed to avoid twisting with pericare, use of tongs     Functional mobility during ADLs: Supervision/safety General ADL Comments: Instructed in activities to avoid.     Vision Patient Visual Report: No change from baseline       Perception     Praxis      Pertinent Vitals/Pain Pain Assessment: Faces Faces Pain Scale: Hurts little more Pain Location: back Pain Descriptors / Indicators: Sore;Discomfort Pain Intervention(s): Premedicated before session;Monitored during session     Hand Dominance Right   Extremity/Trunk Assessment Upper Extremity Assessment Upper Extremity Assessment: Overall WFL for tasks assessed   Lower Extremity Assessment Lower Extremity Assessment: Defer to PT evaluation       Communication Communication Communication: No difficulties   Cognition Arousal/Alertness: Awake/alert Behavior During Therapy: WFL for tasks assessed/performed Overall Cognitive Status: Within Functional Limits for tasks assessed                                     General Comments       Exercises     Shoulder Instructions      Home Living Family/patient expects to be discharged to:: Private residence Living Arrangements: Spouse/significant other Available Help at Discharge: Family;Available 24 hours/day Type of Home: House Home Access:  Stairs to enter [employer] of Steps: 1+1   Home Layout: One level     Bathroom Shower/Tub: Producer, television/film/video: Handicapped height     Home Equipment: Information systems manager - built Designer, fashion/clothing: Reacher        Prior Functioning/Environment Level of Independence: Independent                 OT Problem List:        OT Treatment/Interventions:      OT Goals(Current goals can be found in the care plan  section) Acute Rehab OT Goals Patient Stated Goal: return to PLOF  OT Frequency:     Barriers to D/C:            Co-evaluation              AM-PAC OT "6 Clicks" Daily Activity     Outcome Measure Help from another person eating meals?: None Help from another person taking care of personal grooming?: A Little Help from another person toileting, which includes using toliet, bedpan, or urinal?: A Little Help from another person bathing (including washing, rinsing, drying)?: A Little Help from another person to put on and taking off regular upper body clothing?: None Help from another person to put on and taking off regular lower body clothing?: A Little 6 Click Score: 20   End of Session Equipment Utilized During Treatment: Gait belt Nurse Communication: Other (comment) (ready to d/c)  Activity Tolerance: Patient tolerated treatment well Patient left: in chair;with call bell/phone within reach;with family/visitor present  OT Visit Diagnosis: Pain;Other abnormalities of gait and mobility (R26.89)                Time: 8756-4332 OT Time Calculation (min): 28 min Charges:  OT General Charges $OT Visit: 1 Visit OT Evaluation $OT Eval Low Complexity: 1 Low OT Treatments $Self Care/Home Management : 8-22 mins  Martie Round, OTR/L Acute Rehabilitation Services Pager: [phone] Office: [phone]  Evern Bio 09/12/2019, 10:28 AM

## 2019-09-12 NOTE — Anesthesia Postprocedure Evaluation (Signed)
Anesthesia Post Note  Patient: Babacar Haycraft [facility]  Procedure(s) Performed: Laminectomy and Foraminotomy - Lumbar Two-Three - right (Right Spine Lumbar)     Patient location during evaluation: PACU Anesthesia Type: General Level of consciousness: awake and alert Pain management: pain level controlled Vital Signs Assessment: post-procedure vital signs reviewed and stable Respiratory status: spontaneous breathing, nonlabored ventilation, respiratory function stable and patient connected to nasal cannula oxygen Cardiovascular status: blood pressure returned to baseline and stable Postop Assessment: no apparent nausea or vomiting Anesthetic complications: no   No complications documented.  Last Vitals:  Vitals:   09/11/19 2345 09/12/19 0347  BP: 97/68 132/80  Pulse: 66 (!) 54  Resp: 20 20  Temp: 36.7 C 36.4 C  SpO2: 94% 97%    Last Pain:  Vitals:   09/12/19 0518  TempSrc:   PainSc: 4    Pain Goal: Patients Stated Pain Goal: 3 (09/12/19 0518)                 Trevor Iha

## 2019-10-23 ENCOUNTER — Other Ambulatory Visit: Payer: Self-pay

## 2019-10-23 ENCOUNTER — Ambulatory Visit: Payer: No Typology Code available for payment source | Attending: Neurosurgery | Admitting: Physical Therapy

## 2019-10-23 ENCOUNTER — Encounter: Payer: Self-pay | Admitting: Physical Therapy

## 2019-10-23 DIAGNOSIS — G8929 Other chronic pain: Secondary | ICD-10-CM | POA: Diagnosis present

## 2019-10-23 DIAGNOSIS — M545 Low back pain: Secondary | ICD-10-CM | POA: Insufficient documentation

## 2019-10-23 DIAGNOSIS — M6281 Muscle weakness (generalized): Secondary | ICD-10-CM | POA: Diagnosis present

## 2019-10-23 NOTE — Patient Instructions (Signed)
Access Code: [email or web address] URL: [email or web address] Date: 10/23/2019 Prepared by: Rosana Hoes  Exercises Supine Active Straight Leg Raise - 1 x daily - 4 x weekly - 3 sets - 10 reps Supine Bridge - 1 x daily - 4 x weekly - 2 sets - 10 reps - 3 seconds hold Sidelying Hip Abduction - 1 x daily - 4 x weekly - 3 sets - 10 reps Bird Dog - 1 x daily - 4 x weekly - 3 sets - 10 reps - 5 seconds hold Squat with Chair Touch - 1 x daily - 4 x weekly - 3 sets - 10 reps Standing Anti-Rotation Press with Anchored Resistance - 1 x daily - 4 x weekly - 2 sets - 10 reps - 3 seconds hold

## 2019-10-23 NOTE — Therapy (Signed)
[facility] [address] [place], [place], [address] Phone: [phone]   Fax:  [phone]  Physical Therapy Evaluation  Patient Details  Name: Manuel Pullinghomas E Osorno MRN: [id number] Date of Birth: 03-Jun-1965 Referring Provider (PT): Donalee Citrinram, Gary, MD   Encounter Date: 10/23/2019   PT End of Session - 10/23/19 0926    Visit Number 1    Number of Visits 8    Date for PT Re-Evaluation 12/18/19    Authorization Type [place]    Authorization Time Period 10/19/2019 - 02/15/2020    Authorization - Visit Number 1    Authorization - Number of Visits 15    PT Start Time 0915    PT Stop Time 1000    PT Time Calculation (min) 45 min    Activity Tolerance Patient tolerated treatment well    Behavior During Therapy [facility] for tasks assessed/performed           Past Medical History:  Diagnosis Date  . Anxiety   . Elevated liver enzymes    has had increases due to headache meds, but back to normal   . GERD (gastroesophageal reflux disease)   . Headache(784.0)    MIGRAINES   . Hypertension   . Hypothyroidism   . PONV (postoperative nausea and vomiting)    DIFFICULTY WAKING UP   . PTSD (post-traumatic stress disorder)   . Sleep apnea 2019    Past Surgical History:  Procedure Laterality Date  . ANTERIOR CERVICAL DECOMP/DISCECTOMY FUSION  03/29/2011   Procedure: ANTERIOR CERVICAL DECOMPRESSION/DISCECTOMY FUSION 2 LEVELS;  Surgeon: Reinaldo Meekerandy O Kritzer, MD;  Location: [facility];  Service: Neurosurgery;  Laterality: N/A;  CERVICAL FOUR-FIVE, CERVICAL SIX-SEVEN  ANTERIOR CERVICAL DECOMPRESSION FUSION  WITH TRABECULAR METAL  AND PLATE  . APPENDECTOMY  03/2017  . DISTAL BICEPS TENDON REPAIR  2008   LEFT   . LUMBAR LAMINECTOMY/DECOMPRESSION MICRODISCECTOMY Right 09/11/2019   Procedure: Laminectomy and Foraminotomy - Lumbar Two-Three - right;  Surgeon: Donalee Citrinram, Gary, MD;  [facility];  Service: Neurosurgery;  Laterality: Right;  right  . NECK SURGERY  1995    FUSION   . SHOULDER SURGERY  2000   RIGHT     LEFT  2004     There were no vitals filed for this visit.    Subjective Assessment - 10/23/19 0913    Subjective Patient had lumbar L2-3 laminectomy on 09/11/2019. Prior to surgery he could barely stand up straight and had difficulty walking. Currently he is doing well in general, he is following his precautions the best of his ability. He reports no pain currently which makes it difficult to remember precautions. He hasn't been lifting anything.    Pertinent History Seizures, 9 previous surgeries    Limitations Standing;Walking;House hold activities;Lifting    How long can you sit comfortably? No limitation    How long can you stand comfortably? Will still get numbness in front of thigh with 45 minutes    How long can you walk comfortably? Will still get numbness in front of thigh with 45 minutes    Patient Stated Goals Patient wants to be able to do activity without limitations, working out, hiking, yardwork    Currently in Pain? Yes    Pain Score 0-No pain    Pain Location Back    Pain Orientation Lower    Pain Type Surgical pain    Pain Onset More than a month ago    Pain Frequency Intermittent    Effect  of Pain on Daily Activities Currently patient limited by precautions rather than pain              [facility] PT Assessment - 10/23/19 0001      Assessment   Medical Diagnosis Decompressive laminotomy on right L2-3 with microdissection right L3 nerve root and microscopic foraminotomy with partial medial facetectomy    Referring Provider (PT) Donalee Citrin, MD    Onset Date/Surgical Date 09/11/19    Hand Dominance Right    Next MD Visit Unknow (6 weeks)    Prior Therapy Yes - prior to surgery      Precautions   Precautions Back      Restrictions   Weight Bearing Restrictions No      Balance Screen   Has the patient fallen in the past 6 months No    Has the patient had a decrease in activity level because of a fear of falling?  No     Is the patient reluctant to leave their home because of a fear of falling?  No      Home Environment   Living Environment Private residence    Living Arrangements Spouse/significant other    Type of Home House    Home Access Level entry    Home Layout One level      Prior Function   Level of Independence Independent with basic ADLs    Vocation Retired   [place] has him on disability   Leisure [place] work, hiking, walking, exercising      Cognition   Overall Cognitive Status Within Functional Limits for tasks assessed      Observation/Other Assessments   Observations Patient appears in no apparent distress    Focus on Therapeutic Outcomes (FOTO)  40% limitation      Sensation   Light Touch Appears Intact      Coordination   Gross Motor Movements are Fluid and Coordinated Yes      Posture/Postural Control   Posture Comments Patient exhibits rounded shoulder posture      ROM / Strength   AROM / PROM / Strength Strength;PROM;AROM      AROM   Overall AROM Comments Lumbar AROM not assessed      PROM   Overall PROM Comments Hip PROM grossly WFL and non-painful      Strength   Strength Assessment Site Hip;Knee;Ankle    Right/Left Hip Right;Left    Right Hip Flexion 4+/5    Right Hip Extension 4-/5    Right Hip ABduction 4-/5    Left Hip Flexion 4+/5    Left Hip Extension 4-/5    Left Hip ABduction 4-/5    Right/Left Knee Right;Left    Right Knee Flexion 5/5    Right Knee Extension 5/5    Left Knee Flexion 5/5    Left Knee Extension 5/5    Right/Left Ankle Right;Left    Right Ankle Dorsiflexion 5/5    Right Ankle Plantar Flexion 5/5    Left Ankle Dorsiflexion 5/5    Left Ankle Plantar Flexion 5/5      Flexibility   Soft Tissue Assessment /Muscle Length yes    Hamstrings WFL    Quadriceps WFL      Palpation   Spinal mobility Not assessed    Palpation comment Non-TTP      Special Tests   Other special tests Not assessed      Transfers   Transfers Independent  with all Transfers  Ambulation/Gait   Ambulation/Gait Yes    Ambulation/Gait Assistance 7: Independent    Gait Pattern Within Functional Limits                      Objective measurements completed on examination: See above findings.       [facility] Adult PT Treatment/Exercise - 10/23/19 0001      Exercises   Exercises Lumbar      Lumbar Exercises: Standing   Other Standing Lumbar Exercises Pallof press with green band 10 x 3 sec hold      Lumbar Exercises: Seated   Sit to Stand 10 reps    Sit to Stand Limitations focus on proper hip hinge technique      Lumbar Exercises: Supine   Bridge 10 reps;3 seconds    Straight Leg Raise 10 reps      Lumbar Exercises: Sidelying   Hip Abduction 10 reps      Lumbar Exercises: Quadruped   Opposite Arm/Leg Raise 10 reps;5 seconds                  PT Education - 10/23/19 0926    Education Details Exam findings, POC, HEP, precautions    Person(s) Educated Patient    Methods Explanation;Demonstration;Tactile cues;Verbal cues;Handout    Comprehension Verbalized understanding;Returned demonstration;Verbal cues required;Tactile cues required;Need further instruction            PT Short Term Goals - 10/23/19 0928      PT SHORT TERM GOAL #1   Title Patient will be I with initial HEP to progress with PT    Time 4    Period Weeks    Status New    Target Date 11/20/19      PT SHORT TERM GOAL #2   Title PT will review FOTO and patient will understand room for progress    Time 4    Period Weeks    Status New    Target Date 11/20/19      PT SHORT TERM GOAL #3   Title Patient will exhibit proper lifting mechanics with hip hinge technique and neutral spine to be able to progress with exercise    Time 4    Period Weeks    Status New    Target Date 11/20/19      PT SHORT TERM GOAL #4   Title Patient will be able to walk and stand > 1 hour without pain or limitation    Time 4    Period Weeks    Status New     Target Date 11/20/19             PT Long Term Goals - 10/23/19 0928      PT LONG TERM GOAL #1   Title Patient will be I with final HEP to maintain progress from PT    Time 8    Period Weeks    Status New    Target Date 12/18/19      PT LONG TERM GOAL #2   Title Patient will report improved functional level to </= 36% limitation on FOTO    Time 8    Period Weeks    Status New    Target Date 12/18/19      PT LONG TERM GOAL #3   Title Patient will exhibit gross hip and core strength gross >/= 4+/5 MMT to improve ability to perform household activities    Time 8    Period Weeks  Status New    Target Date 12/18/19      PT LONG TERM GOAL #4   Title Patient will be able to lift >/= 25 lbs using proper form to be able to perform yardwork without limitation    Time 8    Period Weeks    Status New    Target Date 12/18/19                  Plan - 10/23/19 0930    Clinical Impression Statement Patient presents to PT following decompressive laminotomy on right L2-3 with microdissection right L3 nerve root and microscopic foraminotomy with partial medial facetectomy on 09/11/2019. Currently he is doing very well and reporting minimal pain. He does exhibit limitations with core and hip strength, and his lumbar motion was not assessed this visit. He was provided with exercises to initiate strengthening and was educated on precautions and expectations moving forward. Patient would benefit from continued skilled PT to progress his mobility and strength in order to return to prior level of function consisting of yard work and hiking without limitation.    Personal Factors and Comorbidities Fitness;Past/Current Experience;Comorbidity 3+    Comorbidities Previous cervical, shoulder, bicep, knee surgeries; seizures, anxiety, depression, HTN, BMI    Examination-Activity Limitations Locomotion Level;Squat;Stand;Lift;Bend;Carry    Examination-Participation Restrictions  Occupation;Cleaning;Community Activity;Shop;Yard Work;Laundry    Stability/Clinical Decision Making Stable/Uncomplicated    Clinical Decision Making Low    Rehab Potential Good    PT Frequency 1x / week    PT Duration 8 weeks    PT Treatment/Interventions ADLs/Self Care Home Management;Cryotherapy;Electrical Stimulation;Iontophoresis 4mg /ml Dexamethasone;Moist Heat;Neuromuscular re-education;Balance training;Therapeutic exercise;Therapeutic activities;Functional mobility training;Stair training;Gait training;Patient/family education;Manual techniques;Dry needling;Passive range of motion;Taping;Spinal Manipulations;Joint Manipulations    PT Next Visit Plan Assess HEP and progress PRN, review FOTO, proper lifting mechanics with hip hinge technique, progress core and hip strength    PT Home Exercise Plan [id number]    Consulted and Agree with Plan of Care Patient           Patient will benefit from skilled therapeutic intervention in order to improve the following deficits and impairments:  Decreased range of motion, Decreased activity tolerance, Pain, Decreased strength, Improper body mechanics  Visit Diagnosis: Chronic bilateral low back pain, unspecified whether sciatica present  Muscle weakness (generalized)     Problem List Patient Active Problem List   Diagnosis Date Noted  . Hospice care patient 09/12/2019  . Spinal stenosis of lumbar region 09/11/2019    09/13/2019, PT, DPT, LAT, ATC 10/23/19  11:33 AM Phone: [phone] Fax: [phone]   [facility] [address] [place], [place], [place] Phone: [phone]   Fax:  [phone]  Name: ISAMI MEHRA MRN: Manuel Pearson Date of Birth: [birth year]

## 2019-10-30 ENCOUNTER — Ambulatory Visit: Payer: No Typology Code available for payment source | Admitting: Physical Therapy

## 2019-10-30 ENCOUNTER — Encounter: Payer: Self-pay | Admitting: Physical Therapy

## 2019-10-30 ENCOUNTER — Other Ambulatory Visit: Payer: Self-pay

## 2019-10-30 DIAGNOSIS — M545 Low back pain, unspecified: Secondary | ICD-10-CM

## 2019-10-30 DIAGNOSIS — G8929 Other chronic pain: Secondary | ICD-10-CM

## 2019-10-30 DIAGNOSIS — M6281 Muscle weakness (generalized): Secondary | ICD-10-CM

## 2019-10-30 NOTE — Therapy (Signed)
[facility] [address] [place], [place], [address] Phone: [phone]   Fax:  [phone]  Physical Therapy Treatment  Patient Details  Name: Manuel Pearson MRN: [id number] Date of Birth: 12/24/1965 Referring Provider (PT): Donalee Citrin, MD   Encounter Date: 10/30/2019   PT End of Session - 10/30/19 0909    Visit Number 2    Number of Visits 8    Date for PT Re-Evaluation 12/18/19    Authorization Type [place]    Authorization Time Period 10/19/2019 - 02/15/2020    Authorization - Visit Number 2    Authorization - Number of Visits 15    PT Start Time 0915    PT Stop Time 0958    PT Time Calculation (min) 43 min    Activity Tolerance Patient tolerated treatment well    Behavior During Therapy [facility] for tasks assessed/performed           Past Medical History:  Diagnosis Date  . Anxiety   . Elevated liver enzymes    has had increases due to headache meds, but back to normal   . GERD (gastroesophageal reflux disease)   . Headache(784.0)    MIGRAINES   . Hypertension   . Hypothyroidism   . PONV (postoperative nausea and vomiting)    DIFFICULTY WAKING UP   . PTSD (post-traumatic stress disorder)   . Sleep apnea 2019    Past Surgical History:  Procedure Laterality Date  . ANTERIOR CERVICAL DECOMP/DISCECTOMY FUSION  03/29/2011   Procedure: ANTERIOR CERVICAL DECOMPRESSION/DISCECTOMY FUSION 2 LEVELS;  Surgeon: Reinaldo Meeker, MD;  Location: [facility];  Service: Neurosurgery;  Laterality: N/A;  CERVICAL FOUR-FIVE, CERVICAL SIX-SEVEN  ANTERIOR CERVICAL DECOMPRESSION FUSION  WITH TRABECULAR METAL  AND PLATE  . APPENDECTOMY  03/2017  . DISTAL BICEPS TENDON REPAIR  2008   LEFT   . LUMBAR LAMINECTOMY/DECOMPRESSION MICRODISCECTOMY Right 09/11/2019   Procedure: Laminectomy and Foraminotomy - Lumbar Two-Three - right;  Surgeon: Donalee Citrin, MD;  [facility];  Service: Neurosurgery;  Laterality: Right;  right  . NECK SURGERY  1995    FUSION   . SHOULDER SURGERY  2000   RIGHT     LEFT  2004     There were no vitals filed for this visit.   Subjective Assessment - 10/30/19 0914    Subjective Patient reports he is doing well, he feels he has been a lot more active. The exercises are going well, he does need to build up his endurance and is only able to do about 2 sets.    Patient Stated Goals Patient wants to be able to do activity without limitations, working out, hiking, yardwork    Currently in Pain? No/denies              [facility] PT Assessment - 10/30/19 0001      Flexibility   Soft Tissue Assessment /Muscle Length yes    Piriformis Slightly limited      Palpation   Palpation comment Non-TTP                         [facility] Adult PT Treatment/Exercise - 10/30/19 0001      Exercises   Exercises Lumbar      Lumbar Exercises: Stretches   Piriformis Stretch 2 reps;30 seconds    Piriformis Stretch Limitations supine      Lumbar Exercises: Aerobic   Nustep L6 x 5 min (LE only)  Lumbar Exercises: Standing   Other Standing Lumbar Exercises Pallof press with freemotion 10# 2x10 3 sec hold      Lumbar Exercises: Seated   Sit to Stand 10 reps   2 sets   Sit to Stand Limitations focus on proper hip hinge technique      Lumbar Exercises: Supine   Bridge 10 reps;3 seconds   2 sets   Basic Lumbar Stabilization Limitations 90-90 alternating heel taps 4x20 sec      Lumbar Exercises: Quadruped   Opposite Arm/Leg Raise 10 reps;5 seconds   2 sets                 PT Education - 10/30/19 0909    Education Details FOTO review, HEP, posture    Person(s) Educated Patient    Methods Explanation;Demonstration;Tactile cues;Handout    Comprehension Verbalized understanding;Returned demonstration;Verbal cues required;Need further instruction            PT Short Term Goals - 10/23/19 0928      PT SHORT TERM GOAL #1   Title Patient will be I with initial HEP to progress with PT    Time 4     Period Weeks    Status New    Target Date 11/20/19      PT SHORT TERM GOAL #2   Title PT will review FOTO and patient will understand room for progress    Time 4    Period Weeks    Status New    Target Date 11/20/19      PT SHORT TERM GOAL #3   Title Patient will exhibit proper lifting mechanics with hip hinge technique and neutral spine to be able to progress with exercise    Time 4    Period Weeks    Status New    Target Date 11/20/19      PT SHORT TERM GOAL #4   Title Patient will be able to walk and stand > 1 hour without pain or limitation    Time 4    Period Weeks    Status New    Target Date 11/20/19             PT Long Term Goals - 10/23/19 0928      PT LONG TERM GOAL #1   Title Patient will be I with final HEP to maintain progress from PT    Time 8    Period Weeks    Status New    Target Date 12/18/19      PT LONG TERM GOAL #2   Title Patient will report improved functional level to </= 36% limitation on FOTO    Time 8    Period Weeks    Status New    Target Date 12/18/19      PT LONG TERM GOAL #3   Title Patient will exhibit gross hip and core strength gross >/= 4+/5 MMT to improve ability to perform household activities    Time 8    Period Weeks    Status New    Target Date 12/18/19      PT LONG TERM GOAL #4   Title Patient will be able to lift >/= 25 lbs using proper form to be able to perform yardwork without limitation    Time 8    Period Weeks    Status New    Target Date 12/18/19                 Plan -  10/30/19 0910    Clinical Impression Statement Patient tolerated therapy well with no advers effects. Continues working on core and hip strengthening this visit with focus on maintaining neutral spine. He did require occasional cueing for proper core control. Patient reported fatigue with exercises but denied any pain. No change to HEP this visit, encouraged to continue building up endurance. Patient would benefit from continued  skilled PT to progress his mobility and strength in order to return to prior level of function consisting of yard work and hiking without limitation.    PT Treatment/Interventions ADLs/Self Care Home Management;Cryotherapy;Electrical Stimulation;Iontophoresis 4mg /ml Dexamethasone;Moist Heat;Neuromuscular re-education;Balance training;Therapeutic exercise;Therapeutic activities;Functional mobility training;Stair training;Gait training;Patient/family education;Manual techniques;Dry needling;Passive range of motion;Taping;Spinal Manipulations;Joint Manipulations    PT Next Visit Plan Assess HEP and progress PRN, review FOTO, proper lifting mechanics with hip hinge technique, progress core and hip strength    PT Home Exercise Plan [id number]    Consulted and Agree with Plan of Care Patient           Patient will benefit from skilled therapeutic intervention in order to improve the following deficits and impairments:  Decreased range of motion, Decreased activity tolerance, Pain, Decreased strength, Improper body mechanics  Visit Diagnosis: Chronic bilateral low back pain, unspecified whether sciatica present  Muscle weakness (generalized)     Problem List Patient Active Problem List   Diagnosis Date Noted  . Hospice care patient 09/12/2019  . Spinal stenosis of lumbar region 09/11/2019    09/13/2019, PT, DPT, LAT, ATC 10/30/19  10:05 AM Phone: [phone] Fax: [phone]   [facility] Outpatient [facility] [address] [place], [place], [place] Phone: [phone]   Fax:  [phone]  Name: Manuel Pearson MRN: Luan Pulling Date of Birth: 04/06/1965

## 2019-11-01 ENCOUNTER — Other Ambulatory Visit: Payer: Self-pay

## 2019-11-01 ENCOUNTER — Ambulatory Visit: Payer: No Typology Code available for payment source | Admitting: Physical Therapy

## 2019-11-01 ENCOUNTER — Encounter: Payer: Self-pay | Admitting: Physical Therapy

## 2019-11-01 DIAGNOSIS — G8929 Other chronic pain: Secondary | ICD-10-CM

## 2019-11-01 DIAGNOSIS — M545 Low back pain, unspecified: Secondary | ICD-10-CM

## 2019-11-01 DIAGNOSIS — M6281 Muscle weakness (generalized): Secondary | ICD-10-CM

## 2019-11-01 NOTE — Therapy (Signed)
[facility] Outpatient Rehabilitation [facility] [address] [place], [place], [address] Phone: [phone]   Fax:  [phone]  Physical Therapy Treatment  Patient Details  Name: Manuel Pearson MRN: [id number] Date of Birth: 12-16-1965 Referring Provider (PT): Donalee Citrin, MD   Encounter Date: 11/01/2019   PT End of Session - 11/01/19 0940    Visit Number 3    Number of Visits 8    Date for PT Re-Evaluation 12/18/19    Authorization Type [place]    Authorization Time Period 10/19/2019 - 02/15/2020    Authorization - Visit Number 3    Authorization - Number of Visits 15    PT Start Time 0937    PT Stop Time 1015    PT Time Calculation (min) 38 min           Past Medical History:  Diagnosis Date  . Anxiety   . Elevated liver enzymes    has had increases due to headache meds, but back to normal   . GERD (gastroesophageal reflux disease)   . Headache(784.0)    MIGRAINES   . Hypertension   . Hypothyroidism   . PONV (postoperative nausea and vomiting)    DIFFICULTY WAKING UP   . PTSD (post-traumatic stress disorder)   . Sleep apnea 2019    Past Surgical History:  Procedure Laterality Date  . ANTERIOR CERVICAL DECOMP/DISCECTOMY FUSION  03/29/2011   Procedure: ANTERIOR CERVICAL DECOMPRESSION/DISCECTOMY FUSION 2 LEVELS;  Surgeon: Reinaldo Meeker, MD;  Location: [facility];  Service: Neurosurgery;  Laterality: N/A;  CERVICAL FOUR-FIVE, CERVICAL SIX-SEVEN  ANTERIOR CERVICAL DECOMPRESSION FUSION  WITH TRABECULAR METAL  AND PLATE  . APPENDECTOMY  03/2017  . DISTAL BICEPS TENDON REPAIR  2008   LEFT   . LUMBAR LAMINECTOMY/DECOMPRESSION MICRODISCECTOMY Right 09/11/2019   Procedure: Laminectomy and Foraminotomy - Lumbar Two-Three - right;  Surgeon: Donalee Citrin, MD;  Location: [facility] OR;  Service: Neurosurgery;  Laterality: Right;  right  . NECK SURGERY  1995   FUSION   . SHOULDER SURGERY  2000   RIGHT     LEFT  2004     There were no vitals filed for this visit.    Subjective Assessment - 11/01/19 0939    Subjective Back is feeling a lot better.    Currently in Pain? No/denies                             [facility] Adult PT Treatment/Exercise - 11/01/19 0001      Lumbar Exercises: Stretches   Single Knee to Chest Stretch 20 seconds;2 reps    Piriformis Stretch 2 reps;30 seconds    Piriformis Stretch Limitations supine      Lumbar Exercises: Aerobic   Nustep L6 x 5 min (LE only)      Lumbar Exercises: Standing   Other Standing Lumbar Exercises Pallof press with freemotion 10# 2x10 3 sec hold      Lumbar Exercises: Seated   Sit to Stand 10 reps   2 sets   Sit to Stand Limitations focus on proper hip hinge technique    Other Seated Lumbar Exercises 8 inch step lifting 15# KB x 20 - using verbal and tactile cues with wooden dowel for alignment       Lumbar Exercises: Supine   Bent Knee Raise 20 reps    Bent Knee Raise Limitations Cues for PPT     Bridge 10 reps;3 seconds  2 sets   Basic Lumbar Stabilization Limitations 90-90 alternating heel taps 4x20 sec   disc., poor abdominal control    Straight Leg Raise 10 reps    Straight Leg Raises Limitations with opp UE reach to knee - cues for breathing       Lumbar Exercises: Sidelying   Hip Abduction 10 reps   2 sets      Lumbar Exercises: Quadruped   Opposite Arm/Leg Raise 10 reps;5 seconds   2 sets                   PT Short Term Goals - 10/23/19 0539      PT SHORT TERM GOAL #1   Title Patient will be I with initial HEP to progress with PT    Time 4    Period Weeks    Status New    Target Date 11/20/19      PT SHORT TERM GOAL #2   Title PT will review FOTO and patient will understand room for progress    Time 4    Period Weeks    Status New    Target Date 11/20/19      PT SHORT TERM GOAL #3   Title Patient will exhibit proper lifting mechanics with hip hinge technique and neutral spine to be able to progress with exercise    Time 4    Period Weeks     Status New    Target Date 11/20/19      PT SHORT TERM GOAL #4   Title Patient will be able to walk and stand > 1 hour without pain or limitation    Time 4    Period Weeks    Status New    Target Date 11/20/19             PT Long Term Goals - 10/23/19 0928      PT LONG TERM GOAL #1   Title Patient will be I with final HEP to maintain progress from PT    Time 8    Period Weeks    Status New    Target Date 12/18/19      PT LONG TERM GOAL #2   Title Patient will report improved functional level to </= 36% limitation on FOTO    Time 8    Period Weeks    Status New    Target Date 12/18/19      PT LONG TERM GOAL #3   Title Patient will exhibit gross hip and core strength gross >/= 4+/5 MMT to improve ability to perform household activities    Time 8    Period Weeks    Status New    Target Date 12/18/19      PT LONG TERM GOAL #4   Title Patient will be able to lift >/= 25 lbs using proper form to be able to perform yardwork without limitation    Time 8    Period Weeks    Status New    Target Date 12/18/19                 Plan - 11/01/19 1013    Clinical Impression Statement Pt arrives without pain. Added weight to hip hinging/ dead lift. He tolerated well with cues for alignment. He felt like his muscles were working but reported no pain. Continued with core strength. He needed cues for abdominal recruitment for supine lumbar stabilization so did not progress into table top today. No pain  at end of session.    PT Next Visit Plan Assess HEP and progress PRN, proper lifting mechanics with hip hinge technique, progress core and hip strength    PT Home Exercise Plan [phone]           Patient will benefit from skilled therapeutic intervention in order to improve the following deficits and impairments:  Decreased range of motion, Decreased activity tolerance, Pain, Decreased strength, Improper body mechanics  Visit Diagnosis: Chronic bilateral low back pain,  unspecified whether sciatica present  Muscle weakness (generalized)     Problem List Patient Active Problem List   Diagnosis Date Noted  . Hospice care patient 09/12/2019  . Spinal stenosis of lumbar region 09/11/2019    Sherrie Mustache, [place] 11/01/2019, 10:18 AM  [facility] [address] [place], [place], [address] Phone: [phone]   Fax:  [phone]  Name: Manuel Pearson MRN: [id number] Date of Birth: [birth year]

## 2019-11-05 ENCOUNTER — Encounter: Payer: Self-pay | Admitting: Physical Therapy

## 2019-11-05 ENCOUNTER — Ambulatory Visit: Payer: No Typology Code available for payment source | Admitting: Physical Therapy

## 2019-11-05 ENCOUNTER — Other Ambulatory Visit: Payer: Self-pay

## 2019-11-05 DIAGNOSIS — M545 Low back pain, unspecified: Secondary | ICD-10-CM

## 2019-11-05 DIAGNOSIS — G8929 Other chronic pain: Secondary | ICD-10-CM

## 2019-11-05 DIAGNOSIS — M6281 Muscle weakness (generalized): Secondary | ICD-10-CM

## 2019-11-05 NOTE — Therapy (Signed)
[facility] Outpatient Rehabilitation [facility] [address] [place], [place], [address] Phone: [phone]   Fax:  [phone]  Physical Therapy Treatment  Patient Details  Name: Manuel Pearson MRN: [id number] Date of Birth: [birth year] Referring Provider (PT): Donalee Citrin, MD   Encounter Date: 11/05/2019   PT End of Session - 11/05/19 1011    Visit Number 4    Number of Visits 8    Date for PT Re-Evaluation 12/18/19    Authorization Type [place]    Authorization Time Period 10/19/2019 - 02/15/2020    Authorization - Visit Number 4    Authorization - Number of Visits 15    PT Start Time 0930    PT Stop Time 1013    PT Time Calculation (min) 43 min           Past Medical History:  Diagnosis Date  . Anxiety   . Elevated liver enzymes    has had increases due to headache meds, but back to normal   . GERD (gastroesophageal reflux disease)   . Headache(784.0)    MIGRAINES   . Hypertension   . Hypothyroidism   . PONV (postoperative nausea and vomiting)    DIFFICULTY WAKING UP   . PTSD (post-traumatic stress disorder)   . Sleep apnea 2019    Past Surgical History:  Procedure Laterality Date  . ANTERIOR CERVICAL DECOMP/DISCECTOMY FUSION  03/29/2011   Procedure: ANTERIOR CERVICAL DECOMPRESSION/DISCECTOMY FUSION 2 LEVELS;  Surgeon: Reinaldo Meeker, MD;  Location: [facility];  Service: Neurosurgery;  Laterality: N/A;  CERVICAL FOUR-FIVE, CERVICAL SIX-SEVEN  ANTERIOR CERVICAL DECOMPRESSION FUSION  WITH TRABECULAR METAL  AND PLATE  . APPENDECTOMY  03/2017  . DISTAL BICEPS TENDON REPAIR  2008   LEFT   . LUMBAR LAMINECTOMY/DECOMPRESSION MICRODISCECTOMY Right 09/11/2019   Procedure: Laminectomy and Foraminotomy - Lumbar Two-Three - right;  Surgeon: Donalee Citrin, MD;  Location[facility] OR;  Service: Neurosurgery;  Laterality: Right;  right  . NECK SURGERY  1995   FUSION   . SHOULDER SURGERY  2000   RIGHT     LEFT  2004     There were no vitals filed for this visit.    Subjective Assessment - 11/05/19 0944    Subjective Just muscular tired after last session, not much soreness. My stomach has been upset for the last 2 days.    Currently in Pain? No/denies                             [facility] Adult PT Treatment/Exercise - 11/05/19 0001      Lumbar Exercises: Stretches   Single Knee to Chest Stretch 20 seconds;2 reps    Lower Trunk Rotation 20 seconds    Piriformis Stretch 2 reps;30 seconds    Piriformis Stretch Limitations supine      Lumbar Exercises: Aerobic   Nustep L6 x 5 min (LE only)      Lumbar Exercises: Standing   Other Standing Lumbar Exercises Pallof press with freemotion 10# 2x10 3 sec hold    Other Standing Lumbar Exercises Freemotion row 20# , extension 14# x 20 each       Lumbar Exercises: Seated   Sit to Stand 10 reps   2 sets   Sit to Stand Limitations focus on proper hip hinge technique    Other Seated Lumbar Exercises 6 inch step lifting 15# KB x 20 - using verbal and tactile cues with wooden dowel  for alignment       Lumbar Exercises: Supine   Bridge 10 reps;3 seconds   2 sets     Lumbar Exercises: Sidelying   Hip Abduction 10 reps   2 sets      Lumbar Exercises: Quadruped   Opposite Arm/Leg Raise 10 reps   10 sec holds                   PT Short Term Goals - 10/23/19 0928      PT SHORT TERM GOAL #1   Title Patient will be I with initial HEP to progress with PT    Time 4    Period Weeks    Status New    Target Date 11/20/19      PT SHORT TERM GOAL #2   Title PT will review FOTO and patient will understand room for progress    Time 4    Period Weeks    Status New    Target Date 11/20/19      PT SHORT TERM GOAL #3   Title Patient will exhibit proper lifting mechanics with hip hinge technique and neutral spine to be able to progress with exercise    Time 4    Period Weeks    Status New    Target Date 11/20/19      PT SHORT TERM GOAL #4   Title Patient will be able to walk and  stand > 1 hour without pain or limitation    Time 4    Period Weeks    Status New    Target Date 11/20/19             PT Long Term Goals - 10/23/19 0928      PT LONG TERM GOAL #1   Title Patient will be I with final HEP to maintain progress from PT    Time 8    Period Weeks    Status New    Target Date 12/18/19      PT LONG TERM GOAL #2   Title Patient will report improved functional level to </= 36% limitation on FOTO    Time 8    Period Weeks    Status New    Target Date 12/18/19      PT LONG TERM GOAL #3   Title Patient will exhibit gross hip and core strength gross >/= 4+/5 MMT to improve ability to perform household activities    Time 8    Period Weeks    Status New    Target Date 12/18/19      PT LONG TERM GOAL #4   Title Patient will be able to lift >/= 25 lbs using proper form to be able to perform yardwork without limitation    Time 8    Period Weeks    Status New    Target Date 12/18/19                 Plan - 11/05/19 0947    Clinical Impression Statement Pt arrives reporting no increased pain after last session. He requests to limit abdominal strengthening due to irritated stomach over last 2 days. Continued with lifting mechanics and total body core and flexibility. He tolerated session well without increased pain or stomach issues.    PT Treatment/Interventions ADLs/Self Care Home Management;Cryotherapy;Electrical Stimulation;Iontophoresis 4mg /ml Dexamethasone;Moist Heat;Neuromuscular re-education;Balance training;Therapeutic exercise;Therapeutic activities;Functional mobility training;Stair training;Gait training;Patient/family education;Manual techniques;Dry needling;Passive range of motion;Taping;Spinal Manipulations;Joint Manipulations    PT Next Visit Plan  Assess HEP and progress PRN, proper lifting mechanics with hip hinge technique, progress core and hip strength    PT Home Exercise Plan [phone]           Patient will benefit from  skilled therapeutic intervention in order to improve the following deficits and impairments:  Decreased range of motion, Decreased activity tolerance, Pain, Decreased strength, Improper body mechanics  Visit Diagnosis: No diagnosis found.     Problem List Patient Active Problem List   Diagnosis Date Noted  . Hospice care patient 09/12/2019  . Spinal stenosis of lumbar region 09/11/2019    Sherrie Mustache, [place] 11/05/2019, 10:17 AM  [facility] [address] [place], [place], [address] Phone: [phone]   Fax:  [phone]  Name: GERRON GUIDOTTI MRN: [id number] Date of Birth: 1965/08/27

## 2019-11-11 ENCOUNTER — Other Ambulatory Visit: Payer: Self-pay

## 2019-11-11 ENCOUNTER — Encounter: Payer: Self-pay | Admitting: Physical Therapy

## 2019-11-11 ENCOUNTER — Ambulatory Visit: Payer: No Typology Code available for payment source | Admitting: Physical Therapy

## 2019-11-11 DIAGNOSIS — G8929 Other chronic pain: Secondary | ICD-10-CM

## 2019-11-11 DIAGNOSIS — M6281 Muscle weakness (generalized): Secondary | ICD-10-CM

## 2019-11-11 DIAGNOSIS — M545 Low back pain: Secondary | ICD-10-CM | POA: Diagnosis not present

## 2019-11-11 NOTE — Therapy (Signed)
[facility] Outpatient Rehabilitation [facility] [address] [place], [place], [address] Phone: [phone]   Fax:  [phone]  Physical Therapy Treatment  Patient Details  Name: Manuel Pearson MRN: [id number] Date of Birth: 05/09/1965 Referring Provider (PT): Donalee Citrin, MD   Encounter Date: 11/11/2019   PT End of Session - 11/11/19 0937    Visit Number 5    Number of Visits 8    Date for PT Re-Evaluation 12/18/19    Authorization Type [place]    Authorization Time Period 10/19/2019 - 02/15/2020    Authorization - Visit Number 5    Authorization - Number of Visits 15    PT Start Time 0932    PT Stop Time 1015    PT Time Calculation (min) 43 min           Past Medical History:  Diagnosis Date  . Anxiety   . Elevated liver enzymes    has had increases due to headache meds, but back to normal   . GERD (gastroesophageal reflux disease)   . Headache(784.0)    MIGRAINES   . Hypertension   . Hypothyroidism   . PONV (postoperative nausea and vomiting)    DIFFICULTY WAKING UP   . PTSD (post-traumatic stress disorder)   . Sleep apnea 2019    Past Surgical History:  Procedure Laterality Date  . ANTERIOR CERVICAL DECOMP/DISCECTOMY FUSION  03/29/2011   Procedure: ANTERIOR CERVICAL DECOMPRESSION/DISCECTOMY FUSION 2 LEVELS;  Surgeon: Reinaldo Meeker, MD;  Location: [facility];  Service: Neurosurgery;  Laterality: N/A;  CERVICAL FOUR-FIVE, CERVICAL SIX-SEVEN  ANTERIOR CERVICAL DECOMPRESSION FUSION  WITH TRABECULAR METAL  AND PLATE  . APPENDECTOMY  03/2017  . DISTAL BICEPS TENDON REPAIR  2008   LEFT   . LUMBAR LAMINECTOMY/DECOMPRESSION MICRODISCECTOMY Right 09/11/2019   Procedure: Laminectomy and Foraminotomy - Lumbar Two-Three - right;  Surgeon: Donalee Citrin, MD;  Location: [facility] OR;  Service: Neurosurgery;  Laterality: Right;  right  . NECK SURGERY  1995   FUSION   . SHOULDER SURGERY  2000   RIGHT     LEFT  2004     There were no vitals filed for this visit.    Subjective Assessment - 11/11/19 0937    Subjective A little back pain when I wake up. No pain right now.    Currently in Pain? No/denies                             [facility] Adult PT Treatment/Exercise - 11/11/19 0001      Lumbar Exercises: Stretches   Single Knee to Chest Stretch 20 seconds;2 reps    Lower Trunk Rotation 10 seconds    Lower Trunk Rotation Limitations 10 reps    Piriformis Stretch 2 reps;30 seconds    Piriformis Stretch Limitations supine      Lumbar Exercises: Aerobic   Nustep L6 x 5 min (LE only)      Lumbar Exercises: Standing   Other Standing Lumbar Exercises Freemotion row 20# , extension 14# x 20 each       Lumbar Exercises: Seated   Sit to Stand 10 reps   2 sets   Sit to Stand Limitations focus on proper hip hinge technique      Lumbar Exercises: Supine   Bent Knee Raise 20 reps    Bent Knee Raise Limitations Cues for PPT     Bridge 10 reps;3 seconds   2 sets  Straight Leg Raise 10 reps    Straight Leg Raises Limitations with opp UE reach to knee - cues for breathing       Lumbar Exercises: Sidelying   Hip Abduction 10 reps   2 sets      Lumbar Exercises: Quadruped   Opposite Arm/Leg Raise 10 reps   10 sec holds                   PT Short Term Goals - 10/23/19 0928      PT SHORT TERM GOAL #1   Title Patient will be I with initial HEP to progress with PT    Time 4    Period Weeks    Status New    Target Date 11/20/19      PT SHORT TERM GOAL #2   Title PT will review FOTO and patient will understand room for progress    Time 4    Period Weeks    Status New    Target Date 11/20/19      PT SHORT TERM GOAL #3   Title Patient will exhibit proper lifting mechanics with hip hinge technique and neutral spine to be able to progress with exercise    Time 4    Period Weeks    Status New    Target Date 11/20/19      PT SHORT TERM GOAL #4   Title Patient will be able to walk and stand > 1 hour without pain or  limitation    Time 4    Period Weeks    Status New    Target Date 11/20/19             PT Long Term Goals - 10/23/19 0928      PT LONG TERM GOAL #1   Title Patient will be I with final HEP to maintain progress from PT    Time 8    Period Weeks    Status New    Target Date 12/18/19      PT LONG TERM GOAL #2   Title Patient will report improved functional level to </= 36% limitation on FOTO    Time 8    Period Weeks    Status New    Target Date 12/18/19      PT LONG TERM GOAL #3   Title Patient will exhibit gross hip and core strength gross >/= 4+/5 MMT to improve ability to perform household activities    Time 8    Period Weeks    Status New    Target Date 12/18/19      PT LONG TERM GOAL #4   Title Patient will be able to lift >/= 25 lbs using proper form to be able to perform yardwork without limitation    Time 8    Period Weeks    Status New    Target Date 12/18/19                 Plan - 11/11/19 7564    Clinical Impression Statement Pt reports that his seizure meds are depleting his sodium level and that MD wants him not to over exert himself other than his scheduled physical therapy. Continued with previous therex without lifting using weights today. He toelrated session well.    PT Next Visit Plan Assess HEP and progress PRN, proper lifting mechanics with hip hinge technique, progress core and hip strength, (patient is being monitored for low sodium levels -MD recommended do not  over exert but okay to attend PT session)    PT Home Exercise Plan [phone]           Patient will benefit from skilled therapeutic intervention in order to improve the following deficits and impairments:  Decreased range of motion, Decreased activity tolerance, Pain, Decreased strength, Improper body mechanics  Visit Diagnosis: Chronic bilateral low back pain, unspecified whether sciatica present  Muscle weakness (generalized)     Problem List Patient Active Problem  List   Diagnosis Date Noted  . Hospice care patient 09/12/2019  . Spinal stenosis of lumbar region 09/11/2019    Sherrie Mustache, [place] 11/11/2019, 10:37 AM  [facility] [address] [place], [place], [address] Phone: [phone]   Fax:  [phone]  Name: Manuel Pearson MRN: [id number] Date of Birth: 01-18-1966

## 2019-11-15 ENCOUNTER — Ambulatory Visit: Payer: No Typology Code available for payment source | Attending: Neurosurgery | Admitting: Physical Therapy

## 2019-11-15 ENCOUNTER — Encounter: Payer: Self-pay | Admitting: Physical Therapy

## 2019-11-15 ENCOUNTER — Other Ambulatory Visit: Payer: Self-pay

## 2019-11-15 DIAGNOSIS — G8929 Other chronic pain: Secondary | ICD-10-CM | POA: Diagnosis present

## 2019-11-15 DIAGNOSIS — M545 Low back pain: Secondary | ICD-10-CM | POA: Diagnosis present

## 2019-11-15 DIAGNOSIS — M6281 Muscle weakness (generalized): Secondary | ICD-10-CM | POA: Diagnosis present

## 2019-11-15 NOTE — Therapy (Signed)
[facility] Outpatient Rehabilitation [facility] [address] [place], [place], [address] Phone: [phone]   Fax:  [phone]  Physical Therapy Treatment  Patient Details  Name: Manuel Pearson MRN: [id number] Date of Birth: 08/27/1965 Referring Provider (PT): Donalee Citrin, MD   Encounter Date: 11/15/2019   PT End of Session - 11/15/19 0923    Visit Number 6    Number of Visits 8    Date for PT Re-Evaluation 12/18/19    Authorization Type [place]    Authorization Time Period 10/19/2019 - 02/15/2020    Authorization - Visit Number 6    Authorization - Number of Visits 15    PT Start Time 0918    PT Stop Time 0958    PT Time Calculation (min) 40 min    Activity Tolerance Patient tolerated treatment well    Behavior During Therapy [facility] for tasks assessed/performed           Past Medical History:  Diagnosis Date  . Anxiety   . Elevated liver enzymes    has had increases due to headache meds, but back to normal   . GERD (gastroesophageal reflux disease)   . Headache(784.0)    MIGRAINES   . Hypertension   . Hypothyroidism   . PONV (postoperative nausea and vomiting)    DIFFICULTY WAKING UP   . PTSD (post-traumatic stress disorder)   . Sleep apnea 2019    Past Surgical History:  Procedure Laterality Date  . ANTERIOR CERVICAL DECOMP/DISCECTOMY FUSION  03/29/2011   Procedure: ANTERIOR CERVICAL DECOMPRESSION/DISCECTOMY FUSION 2 LEVELS;  Surgeon: Reinaldo Meeker, MD;  Location: [facility];  Service: Neurosurgery;  Laterality: N/A;  CERVICAL FOUR-FIVE, CERVICAL SIX-SEVEN  ANTERIOR CERVICAL DECOMPRESSION FUSION  WITH TRABECULAR METAL  AND PLATE  . APPENDECTOMY  03/2017  . DISTAL BICEPS TENDON REPAIR  2008   LEFT   . LUMBAR LAMINECTOMY/DECOMPRESSION MICRODISCECTOMY Right 09/11/2019   Procedure: Laminectomy and Foraminotomy - Lumbar Two-Three - right;  Surgeon: Donalee Citrin, MD;  Location: [facility] OR;  Service: Neurosurgery;  Laterality: Right;  right  . NECK SURGERY  1995    FUSION   . SHOULDER SURGERY  2000   RIGHT     LEFT  2004     There were no vitals filed for this visit.   Subjective Assessment - 11/15/19 0918    Subjective Patient reports he is doing well, back just feels a little achy in the morning but once he moves around he is fine. Patient states he is still having sodium issues so has been avoiding strenuous activity so he hasn't really been doing exercises at home.    Patient Stated Goals Patient wants to be able to do activity without limitations, working out, hiking, yardwork    Currently in Pain? No/denies                             [facility] Adult PT Treatment/Exercise - 11/15/19 0001      Exercises   Exercises Lumbar      Lumbar Exercises: Stretches   Lower Trunk Rotation 5 reps;10 seconds      Lumbar Exercises: Aerobic   Nustep L6 x 5 min (LE only)      Lumbar Exercises: Standing   Lifting 10 reps   3 sets   Lifting Limitations 25# from 8" box      Lumbar Exercises: Seated   Sit to Stand 10 reps   2 sets  Lumbar Exercises: Supine   Bridge 10 reps;3 seconds   2 sets   Bridge Limitations 2nd set perform marching bridges    Straight Leg Raise 20 reps   2 sets   Other Supine Lumbar Exercises 90-90 alternating heel taps 2 x 30 sec      Lumbar Exercises: Sidelying   Hip Abduction 15 reps   2 sets     Lumbar Exercises: Quadruped   Opposite Arm/Leg Raise 10 reps;5 seconds   2 sets                 PT Education - 11/15/19 0922    Education Details HEP    Person(s) Educated Patient    Methods Explanation    Comprehension Verbalized understanding;Need further instruction            PT Short Term Goals - 10/23/19 0928      PT SHORT TERM GOAL #1   Title Patient will be I with initial HEP to progress with PT    Time 4    Period Weeks    Status New    Target Date 11/20/19      PT SHORT TERM GOAL #2   Title PT will review FOTO and patient will understand room for progress    Time 4    Period  Weeks    Status New    Target Date 11/20/19      PT SHORT TERM GOAL #3   Title Patient will exhibit proper lifting mechanics with hip hinge technique and neutral spine to be able to progress with exercise    Time 4    Period Weeks    Status New    Target Date 11/20/19      PT SHORT TERM GOAL #4   Title Patient will be able to walk and stand > 1 hour without pain or limitation    Time 4    Period Weeks    Status New    Target Date 11/20/19             PT Long Term Goals - 10/23/19 0928      PT LONG TERM GOAL #1   Title Patient will be I with final HEP to maintain progress from PT    Time 8    Period Weeks    Status New    Target Date 12/18/19      PT LONG TERM GOAL #2   Title Patient will report improved functional level to </= 36% limitation on FOTO    Time 8    Period Weeks    Status New    Target Date 12/18/19      PT LONG TERM GOAL #3   Title Patient will exhibit gross hip and core strength gross >/= 4+/5 MMT to improve ability to perform household activities    Time 8    Period Weeks    Status New    Target Date 12/18/19      PT LONG TERM GOAL #4   Title Patient will be able to lift >/= 25 lbs using proper form to be able to perform yardwork without limitation    Time 8    Period Weeks    Status New    Target Date 12/18/19                 Plan - 11/15/19 4098    Clinical Impression Statement Patient tolerated therapy well with no adverse effects. Continued light strengthening while avoiding  strenuous activity due to MD orders. He did not report any increased pain and exhibits good core control with exercises. He was encouraged to perform exercise as tolerated at home. He would benefit from continued skilled PT to progress strength and maximize functional level.    PT Treatment/Interventions ADLs/Self Care Home Management;Cryotherapy;Electrical Stimulation;Iontophoresis 4mg /ml Dexamethasone;Moist Heat;Neuromuscular re-education;Balance  training;Therapeutic exercise;Therapeutic activities;Functional mobility training;Stair training;Gait training;Patient/family education;Manual techniques;Dry needling;Passive range of motion;Taping;Spinal Manipulations;Joint Manipulations    PT Next Visit Plan Assess HEP and progress PRN, proper lifting mechanics with hip hinge technique, progress core and hip strength, (patient is being monitored for low sodium levels -MD recommended do not over exert but okay to attend PT session)    PT Home Exercise Plan [id number]    Consulted and Agree with Plan of Care Patient           Patient will benefit from skilled therapeutic intervention in order to improve the following deficits and impairments:  Decreased range of motion, Decreased activity tolerance, Pain, Decreased strength, Improper body mechanics  Visit Diagnosis: Chronic bilateral low back pain, unspecified whether sciatica present  Muscle weakness (generalized)     Problem List Patient Active Problem List   Diagnosis Date Noted  . Hospice care patient 09/12/2019  . Spinal stenosis of lumbar region 09/11/2019    09/13/2019, PT, DPT, LAT, ATC 11/15/19  9:58 AM Phone: [phone] Fax: [phone]   [facility] Outpatient Rehabilitation [facility] [address] [place], [place], [place] Phone: [phone]   Fax:  [phone]  Name: Manuel Pearson MRN: Luan Pulling Date of Birth: 11-22-1965

## 2019-11-20 ENCOUNTER — Other Ambulatory Visit: Payer: Self-pay

## 2019-11-20 ENCOUNTER — Ambulatory Visit: Payer: No Typology Code available for payment source | Admitting: Physical Therapy

## 2019-11-20 ENCOUNTER — Encounter: Payer: Self-pay | Admitting: Physical Therapy

## 2019-11-20 DIAGNOSIS — M545 Low back pain: Secondary | ICD-10-CM | POA: Diagnosis not present

## 2019-11-20 DIAGNOSIS — M6281 Muscle weakness (generalized): Secondary | ICD-10-CM

## 2019-11-20 DIAGNOSIS — G8929 Other chronic pain: Secondary | ICD-10-CM

## 2019-11-20 NOTE — Therapy (Signed)
[facility] Outpatient Rehabilitation [facility] [address] [place], [place], [address] Phone: [phone]   Fax:  [phone]  Physical Therapy Treatment  Patient Details  Name: Manuel Pearson MRN: [id number] Date of Birth: [birth year] Referring Provider (PT): Donalee Citrin, MD   Encounter Date: 11/20/2019   PT End of Session - 11/20/19 0921    Visit Number 7    Number of Visits 8    Date for PT Re-Evaluation 12/18/19    Authorization Type [place]    Authorization Time Period 10/19/2019 - 02/15/2020    Authorization - Visit Number 7    Authorization - Number of Visits 15    PT Start Time 0915    PT Stop Time 0955    PT Time Calculation (min) 40 min    Activity Tolerance Patient tolerated treatment well    Behavior During Therapy [facility] for tasks assessed/performed           Past Medical History:  Diagnosis Date   Anxiety    Elevated liver enzymes    has had increases due to headache meds, but back to normal    GERD (gastroesophageal reflux disease)    Headache(784.0)    MIGRAINES    Hypertension    Hypothyroidism    PONV (postoperative nausea and vomiting)    DIFFICULTY WAKING UP    PTSD (post-traumatic stress disorder)    Sleep apnea 2019    Past Surgical History:  Procedure Laterality Date   ANTERIOR CERVICAL DECOMP/DISCECTOMY FUSION  03/29/2011   Procedure: ANTERIOR CERVICAL DECOMPRESSION/DISCECTOMY FUSION 2 LEVELS;  Surgeon: Reinaldo Meeker, MD;  Location: [facility];  Service: Neurosurgery;  Laterality: N/A;  CERVICAL FOUR-FIVE, CERVICAL SIX-SEVEN  ANTERIOR CERVICAL DECOMPRESSION FUSION  WITH TRABECULAR METAL  AND PLATE   APPENDECTOMY  03/2017   DISTAL BICEPS TENDON REPAIR  2008   LEFT    LUMBAR LAMINECTOMY/DECOMPRESSION MICRODISCECTOMY Right 09/11/2019   Procedure: Laminectomy and Foraminotomy - Lumbar Two-Three - right;  Surgeon: Donalee Citrin, MD;  Location: [facility] OR;  Service: Neurosurgery;  Laterality: Right;  right   NECK SURGERY  1995    FUSION    SHOULDER SURGERY  2000   RIGHT     LEFT  2004     There were no vitals filed for this visit.   Subjective Assessment - 11/20/19 0919    Subjective Patient reports he saw his doctor tomorrow and was released, he was instructed to finish out therapy. He is doing well in regard to her back and leg, he did twist his right knee this morning which is bothering him.    Patient Stated Goals Patient wants to be able to do activity without limitations, working out, hiking, yardwork    Currently in Pain? No/denies              [facility] PT Assessment - 11/20/19 0001      AROM   AROM Assessment Site Lumbar    Lumbar Flexion Fingertips to toes - lumbar spine remains relatively neutral, no increased pain                         [facility] Adult PT Treatment/Exercise - 11/20/19 0001      Exercises   Exercises Lumbar      Lumbar Exercises: Stretches   Lower Trunk Rotation 5 reps;10 seconds      Lumbar Exercises: Aerobic   Nustep L6 x 5 min (LE only)      Lumbar  Exercises: Standing   Lifting 10 reps   3 sets   Lifting Limitations 30# from 8" box    Row 10 reps   2 sets   Row Limitations Single UE with freemotion 10#    Other Standing Lumbar Exercises Pallof press with freemotion 10# 2x10 3 sec hold    Other Standing Lumbar Exercises Lateral band walk with green around knees 3x20      Lumbar Exercises: Supine   Bridge with March 10 reps   2 sets   Straight Leg Raise 20 reps   2 sets   Other Supine Lumbar Exercises 90-90 alternating heel taps 2 x 30 sec                  PT Education - 11/20/19 0920    Education Details HEP    Person(s) Educated Patient    Methods Explanation    Comprehension Verbalized understanding;Need further instruction            PT Short Term Goals - 11/20/19 0928      PT SHORT TERM GOAL #1   Title Patient will be I with initial HEP to progress with PT    Time 4    Period Weeks    Status Achieved    Target Date 11/20/19        PT SHORT TERM GOAL #2   Title PT will review FOTO and patient will understand room for progress    Time 4    Period Weeks    Status Achieved    Target Date 11/20/19      PT SHORT TERM GOAL #3   Title Patient will exhibit proper lifting mechanics with hip hinge technique and neutral spine to be able to progress with exercise    Time 4    Period Weeks    Status Achieved    Target Date 11/20/19      PT SHORT TERM GOAL #4   Title Patient will be able to walk and stand > 1 hour without pain or limitation    Baseline Patient reports he is able to do it but his right thigh will go numb - 11/20/2019    Time 4    Period Weeks    Status On-going    Target Date 11/20/19             PT Long Term Goals - 10/23/19 0928      PT LONG TERM GOAL #1   Title Patient will be I with final HEP to maintain progress from PT    Time 8    Period Weeks    Status New    Target Date 12/18/19      PT LONG TERM GOAL #2   Title Patient will report improved functional level to </= 36% limitation on FOTO    Time 8    Period Weeks    Status New    Target Date 12/18/19      PT LONG TERM GOAL #3   Title Patient will exhibit gross hip and core strength gross >/= 4+/5 MMT to improve ability to perform household activities    Time 8    Period Weeks    Status New    Target Date 12/18/19      PT LONG TERM GOAL #4   Title Patient will be able to lift >/= 25 lbs using proper form to be able to perform yardwork without limitation    Time 8    Period  Weeks    Status New    Target Date 12/18/19                 Plan - 11/20/19 0925    Clinical Impression Statement Patient tolerated therapy well with no adverse effects. Continued to focus on core and hip strengthening this visit with good tolerance. He is progressing well with his strength and denies any increase in pain. He would benefit from continued skilled PT to progress strength and maximize functional level.    PT Treatment/Interventions  ADLs/Self Care Home Management;Cryotherapy;Electrical Stimulation;Iontophoresis 4mg /ml Dexamethasone;Moist Heat;Neuromuscular re-education;Balance training;Therapeutic exercise;Therapeutic activities;Functional mobility training;Stair training;Gait training;Patient/family education;Manual techniques;Dry needling;Passive range of motion;Taping;Spinal Manipulations;Joint Manipulations    PT Next Visit Plan Assess HEP and progress PRN, proper lifting mechanics with hip hinge technique, progress core and hip strength, (patient is being monitored for low sodium levels, MD recommended do not over exert but okay to attend PT session)    PT Home Exercise Plan [id number]    Consulted and Agree with Plan of Care Patient           Patient will benefit from skilled therapeutic intervention in order to improve the following deficits and impairments:  Decreased range of motion, Decreased activity tolerance, Pain, Decreased strength, Improper body mechanics  Visit Diagnosis: Chronic bilateral low back pain, unspecified whether sciatica present  Muscle weakness (generalized)     Problem List Patient Active Problem List   Diagnosis Date Noted   Hospice care patient 09/12/2019   Spinal stenosis of lumbar region 09/11/2019    09/13/2019, PT, DPT, LAT, ATC 11/20/19  9:57 AM Phone: [phone] Fax: [phone]   [facility] Outpatient Rehabilitation [facility] [address] [place], [place], [place] Phone: [phone]   Fax:  [phone]  Name: Manuel Pearson MRN: Luan Pulling Date of Birth: 09-03-1965

## 2019-11-26 ENCOUNTER — Other Ambulatory Visit: Payer: Self-pay

## 2019-11-26 ENCOUNTER — Encounter: Payer: Self-pay | Admitting: Physical Therapy

## 2019-11-26 ENCOUNTER — Ambulatory Visit: Payer: No Typology Code available for payment source | Admitting: Physical Therapy

## 2019-11-26 DIAGNOSIS — M545 Low back pain: Secondary | ICD-10-CM | POA: Diagnosis not present

## 2019-11-26 DIAGNOSIS — M6281 Muscle weakness (generalized): Secondary | ICD-10-CM

## 2019-11-26 DIAGNOSIS — G8929 Other chronic pain: Secondary | ICD-10-CM

## 2019-11-26 NOTE — Therapy (Signed)
[facility] Outpatient Rehabilitation [facility] [address] [place], [place], [address] Phone: [phone]   Fax:  [phone]  Physical Therapy Treatment / ERO   Progress Note Reporting Period 10/23/2019 to 11/26/2019  See note below for Objective Data and Assessment of Progress/Goals.    Patient Details  Name: Manuel Pearson MRN: [id number] Date of Birth: 1966-02-04 Referring Provider (PT): Donalee Citrin, MD   Encounter Date: 11/26/2019   PT End of Session - 11/26/19 0913    Visit Number 8    Number of Visits 15    Date for PT Re-Evaluation 01/21/20    Authorization Type [place]    Authorization Time Period 10/19/2019 - 02/15/2020    Authorization - Visit Number 8    Authorization - Number of Visits 15    Progress Note Due on Visit 18    PT Start Time 0906    PT Stop Time 0950    PT Time Calculation (min) 44 min    Activity Tolerance Patient tolerated treatment well    Behavior During Therapy [facility] for tasks assessed/performed           Past Medical History:  Diagnosis Date  . Anxiety   . Elevated liver enzymes    has had increases due to headache meds, but back to normal   . GERD (gastroesophageal reflux disease)   . Headache(784.0)    MIGRAINES   . Hypertension   . Hypothyroidism   . PONV (postoperative nausea and vomiting)    DIFFICULTY WAKING UP   . PTSD (post-traumatic stress disorder)   . Sleep apnea 2019    Past Surgical History:  Procedure Laterality Date  . ANTERIOR CERVICAL DECOMP/DISCECTOMY FUSION  03/29/2011   Procedure: ANTERIOR CERVICAL DECOMPRESSION/DISCECTOMY FUSION 2 LEVELS;  Surgeon: Reinaldo Meeker, MD;  Location: [facility];  Service: Neurosurgery;  Laterality: N/A;  CERVICAL FOUR-FIVE, CERVICAL SIX-SEVEN  ANTERIOR CERVICAL DECOMPRESSION FUSION  WITH TRABECULAR METAL  AND PLATE  . APPENDECTOMY  03/2017  . DISTAL BICEPS TENDON REPAIR  2008   LEFT   . LUMBAR LAMINECTOMY/DECOMPRESSION MICRODISCECTOMY Right 09/11/2019   Procedure:  Laminectomy and Foraminotomy - Lumbar Two-Three - right;  Surgeon: Donalee Citrin, MD;  Location: [facility] OR;  Service: Neurosurgery;  Laterality: Right;  right  . NECK SURGERY  1995   FUSION   . SHOULDER SURGERY  2000   RIGHT     LEFT  2004     There were no vitals filed for this visit.   Subjective Assessment - 11/26/19 0908    Subjective Patient reports he is doing pretty good. States he got a migraine yesterday afternoon and did not do a lot of anything the next few days. He did feel better and was active over the weekend.    How long can you stand comfortably? No limitation - will get right leg numbness > 1 hour    Patient Stated Goals Patient wants to be able to do activity without limitations, working out, hiking, yardwork    Currently in Pain? No/denies              [facility] PT Assessment - 11/26/19 0001      Assessment   Medical Diagnosis Decompressive laminotomy on right L2-3 with microdissection right L3 nerve root and microscopic foraminotomy with partial medial facetectomy    Referring Provider (PT) Donalee Citrin, MD    Onset Date/Surgical Date 09/11/19      Precautions   Precautions None      Restrictions  Weight Bearing Restrictions No      Balance Screen   Has the patient fallen in the past 6 months No      Cognition   Overall Cognitive Status Within Functional Limits for tasks assessed      Observation/Other Assessments   Focus on Therapeutic Outcomes (FOTO)  37% limitation      Sensation   Light Touch Appears Intact      AROM   Overall AROM Comments Patient exhibits lumbar AROM grossly [facility]    AROM Assessment Site Lumbar      Strength   Right Hip Flexion 4+/5    Right Hip Extension 4/5    Right Hip ABduction 4/5    Left Hip Flexion 4+/5    Left Hip Extension 4/5    Left Hip ABduction 4/5    Right Knee Flexion 5/5    Right Knee Extension 5/5    Left Knee Flexion 5/5    Left Knee Extension 5/5      Transfers   Transfers Independent with all Transfers                          [facility] Adult PT Treatment/Exercise - 11/26/19 0001      Self-Care   Self-Care Other Self-Care Comments    Other Self-Care Comments  POC, HEP, FOTO      Exercises   Exercises Lumbar      Lumbar Exercises: Standing   Lifting 10 reps   3 sets   Lifting Limitations 30# from 4" box    Row 10 reps   3 sets   Row Limitations Single UE with freemotion 13#    Other Standing Lumbar Exercises Lateral band walk with green around knees 3x20      Lumbar Exercises: Supine   Bridge with March 10 reps   2 sets   Other Supine Lumbar Exercises 90-90 alternating leg extensions 2 x 10 each    Other Supine Lumbar Exercises LTR with feet elevated 2 x 5 each       Lumbar Exercises: Sidelying   Hip Abduction 15 reps                  PT Education - 11/26/19 0913    Education Details POC, HEP, FOTO    Person(s) Educated Patient    Methods Explanation;Handout    Comprehension Verbalized understanding;Need further instruction            PT Short Term Goals - 11/26/19 0923      PT SHORT TERM GOAL #1   Title Patient will be I with initial HEP to progress with PT    Status Achieved      PT SHORT TERM GOAL #2   Title PT will review FOTO and patient will understand room for progress    Status Achieved      PT SHORT TERM GOAL #3   Title Patient will exhibit proper lifting mechanics with hip hinge technique and neutral spine to be able to progress with exercise    Time --    Period --    Status Achieved    Target Date --      PT SHORT TERM GOAL #4   Title Patient will be able to walk and stand > 1 hour without pain or limitation    Baseline Patient reports he is able to do it but his right thigh will go numb - 11/26/2019    Time 4  Period Weeks    Status On-going    Target Date 12/24/19             PT Long Term Goals - 11/26/19 0921      PT LONG TERM GOAL #1   Title Patient will be I with final HEP to maintain progress from PT    Time 8     Period Weeks    Status On-going    Target Date 01/21/20      PT LONG TERM GOAL #2   Title Patient will report improved functional level to </= 36% limitation on FOTO    Baseline 37% limitation    Time 8    Period Weeks    Status On-going    Target Date 01/21/20      PT LONG TERM GOAL #3   Title Patient will exhibit gross hip and core strength gross >/= 4+/5 MMT to improve ability to perform household activities    Time 8    Period Weeks    Status On-going    Target Date 01/21/20      PT LONG TERM GOAL #4   Title Patient will be able to lift >/= 25 lbs using proper form to be able to perform yardwork without limitation    Baseline Patient able to lift 30 lbs using proper hip hinge form, good control, and no pain    Status Achieved                 Plan - 11/26/19 0915    Clinical Impression Statement Patient tolerated therapy well with no adverse effects. He still reports low sodium level so his doctor wants him to continue to avoid strenuous activity so no change made to HEP and avoided doing excessive exercise in therapy. Continued working on core and hip strengthening with good tolerance. Patient is demonstrating improvement in control and denies any increase in pain with PT. He would benefit from continued skilled PT to progress strength and maximize functional level.    PT Treatment/Interventions ADLs/Self Care Home Management;Cryotherapy;Electrical Stimulation;Iontophoresis 4mg /ml Dexamethasone;Moist Heat;Neuromuscular re-education;Balance training;Therapeutic exercise;Therapeutic activities;Functional mobility training;Stair training;Gait training;Patient/family education;Manual techniques;Dry needling;Passive range of motion;Taping;Spinal Manipulations;Joint Manipulations    PT Next Visit Plan Assess HEP and progress PRN, proper lifting mechanics with hip hinge technique, progress core and hip strength, (patient is being monitored for low sodium levels, MD recommended do  not over exert but okay to attend PT session)    PT Home Exercise Plan [id number]    Consulted and Agree with Plan of Care Patient           Patient will benefit from skilled therapeutic intervention in order to improve the following deficits and impairments:  Decreased range of motion, Decreased activity tolerance, Pain, Decreased strength, Improper body mechanics  Visit Diagnosis: Chronic bilateral low back pain, unspecified whether sciatica present  Muscle weakness (generalized)     Problem List Patient Active Problem List   Diagnosis Date Noted  . Hospice care patient 09/12/2019  . Spinal stenosis of lumbar region 09/11/2019    09/13/2019, PT, DPT, LAT, ATC 11/26/19  9:48 AM Phone: [phone] Fax: [phone]   [facility] Outpatient Rehabilitation [facility] [address] [place], [place], [place] Phone: [phone]   Fax:  [phone]  Name: Manuel Pearson MRN: Luan Pulling Date of Birth: Jan 02, 1966

## 2019-12-02 ENCOUNTER — Ambulatory Visit: Payer: No Typology Code available for payment source | Admitting: Physical Therapy

## 2019-12-02 ENCOUNTER — Other Ambulatory Visit: Payer: Self-pay

## 2019-12-02 ENCOUNTER — Encounter: Payer: Self-pay | Admitting: Physical Therapy

## 2019-12-02 DIAGNOSIS — G8929 Other chronic pain: Secondary | ICD-10-CM

## 2019-12-02 DIAGNOSIS — M545 Low back pain: Secondary | ICD-10-CM | POA: Diagnosis not present

## 2019-12-02 DIAGNOSIS — M6281 Muscle weakness (generalized): Secondary | ICD-10-CM

## 2019-12-02 NOTE — Therapy (Signed)
[facility] Outpatient [facility] [address] [place], [place], [address] Phone: [phone]   Fax:  [phone]  Physical Therapy Treatment  Patient Details  Name: Manuel Pearson MRN: [id number] Date of Birth: 1965/05/28 Referring Provider (PT): Donalee Citrin, MD   Encounter Date: 12/02/2019   PT End of Session - 12/02/19 0917    Visit Number 9    Number of Visits 15    Date for PT Re-Evaluation 01/21/20    Authorization Type [place]    Authorization Time Period 10/19/2019 - 02/15/2020    Authorization - Visit Number 9    Authorization - Number of Visits 15    Progress Note Due on Visit 18    PT Start Time 0912    PT Stop Time 0955    PT Time Calculation (min) 43 min    Activity Tolerance Patient tolerated treatment well    Behavior During Therapy [facility] for tasks assessed/performed           Past Medical History:  Diagnosis Date  . Anxiety   . Elevated liver enzymes    has had increases due to headache meds, but back to normal   . GERD (gastroesophageal reflux disease)   . Headache(784.0)    MIGRAINES   . Hypertension   . Hypothyroidism   . PONV (postoperative nausea and vomiting)    DIFFICULTY WAKING UP   . PTSD (post-traumatic stress disorder)   . Sleep apnea 2019    Past Surgical History:  Procedure Laterality Date  . ANTERIOR CERVICAL DECOMP/DISCECTOMY FUSION  03/29/2011   Procedure: ANTERIOR CERVICAL DECOMPRESSION/DISCECTOMY FUSION 2 LEVELS;  Surgeon: Reinaldo Meeker, MD;  Location: [facility];  Service: Neurosurgery;  Laterality: N/A;  CERVICAL FOUR-FIVE, CERVICAL SIX-SEVEN  ANTERIOR CERVICAL DECOMPRESSION FUSION  WITH TRABECULAR METAL  AND PLATE  . APPENDECTOMY  03/2017  . DISTAL BICEPS TENDON REPAIR  2008   LEFT   . LUMBAR LAMINECTOMY/DECOMPRESSION MICRODISCECTOMY Right 09/11/2019   Procedure: Laminectomy and Foraminotomy - Lumbar Two-Three - right;  Surgeon: Donalee Citrin, MD;  Location: [facility];  Service: Neurosurgery;  Laterality: Right;   right  . NECK SURGERY  1995   FUSION   . SHOULDER SURGERY  2000   RIGHT     LEFT  2004     There were no vitals filed for this visit.   Subjective Assessment - 12/02/19 0915    Subjective Patient reports he had migraines all weekend and his anxiety is high today, he a short seizure this past Friday. His back is feeling fine.    Patient Stated Goals Patient wants to be able to do activity without limitations, working out, hiking, yardwork    Currently in Pain? No/denies                             [facility] Adult PT Treatment/Exercise - 12/02/19 0001      Exercises   Exercises Lumbar      Lumbar Exercises: Standing   Lifting 10 reps   3 sets   Lifting Limitations 30# deadlift    Row 10 reps   3 sets   Row Limitations Single UE with freemotion 17#    Other Standing Lumbar Exercises Lateral band walk with blue around knees 3x20      Lumbar Exercises: Supine   Bridge with March 10 reps   2 sets   Straight Leg Raise 15 reps   2 sets  Other Supine Lumbar Exercises 90-90 alternating leg extensions 2 x 10 each      Lumbar Exercises: Quadruped   Opposite Arm/Leg Raise 10 reps;5 seconds   2 sets                 PT Education - 12/02/19 0916    Education Details HEP    Person(s) Educated Patient    Methods Explanation    Comprehension Verbalized understanding;Need further instruction            PT Short Term Goals - 11/26/19 0923      PT SHORT TERM GOAL #1   Title Patient will be I with initial HEP to progress with PT    Status Achieved      PT SHORT TERM GOAL #2   Title PT will review FOTO and patient will understand room for progress    Status Achieved      PT SHORT TERM GOAL #3   Title Patient will exhibit proper lifting mechanics with hip hinge technique and neutral spine to be able to progress with exercise    Time --    Period --    Status Achieved    Target Date --      PT SHORT TERM GOAL #4   Title Patient will be able to walk and  stand > 1 hour without pain or limitation    Baseline Patient reports he is able to do it but his right thigh will go numb - 11/26/2019    Time 4    Period Weeks    Status On-going    Target Date 12/24/19             PT Long Term Goals - 11/26/19 0921      PT LONG TERM GOAL #1   Title Patient will be I with final HEP to maintain progress from PT    Time 8    Period Weeks    Status On-going    Target Date 01/21/20      PT LONG TERM GOAL #2   Title Patient will report improved functional level to </= 36% limitation on FOTO    Baseline 37% limitation    Time 8    Period Weeks    Status On-going    Target Date 01/21/20      PT LONG TERM GOAL #3   Title Patient will exhibit gross hip and core strength gross >/= 4+/5 MMT to improve ability to perform household activities    Time 8    Period Weeks    Status On-going    Target Date 01/21/20      PT LONG TERM GOAL #4   Title Patient will be able to lift >/= 25 lbs using proper form to be able to perform yardwork without limitation    Baseline Patient able to lift 30 lbs using proper hip hinge form, good control, and no pain    Status Achieved                 Plan - 12/02/19 0920    Clinical Impression Statement Patient tolerated therapy well with no adverse effects. He is still not able to perform strenuous exercise so therapy continued to progress core and hip strength as tolerated. He is progressing well with all exercises and denies any pain with therapy. He would benefit from continued skilled PT to progress strength and maximize functional level.    PT Treatment/Interventions ADLs/Self Care Home Management;Cryotherapy;Electrical Stimulation;Iontophoresis 4mg /ml Dexamethasone;Moist  Heat;Neuromuscular re-education;Balance training;Therapeutic exercise;Therapeutic activities;Functional mobility training;Stair training;Gait training;Patient/family education;Manual techniques;Dry needling;Passive range of  motion;Taping;Spinal Manipulations;Joint Manipulations    PT Next Visit Plan Assess HEP and progress PRN, proper lifting mechanics with hip hinge technique, progress core and hip strength, (patient is being monitored for low sodium levels, MD recommended do not over exert but okay to attend PT session)    PT Home Exercise Plan [id number]    Consulted and Agree with Plan of Care Patient           Patient will benefit from skilled therapeutic intervention in order to improve the following deficits and impairments:  Decreased range of motion, Decreased activity tolerance, Pain, Decreased strength, Improper body mechanics  Visit Diagnosis: Chronic bilateral low back pain, unspecified whether sciatica present  Muscle weakness (generalized)     Problem List Patient Active Problem List   Diagnosis Date Noted  . Hospice care patient 09/12/2019  . Spinal stenosis of lumbar region 09/11/2019    Rosana Hoes, PT, DPT, LAT, ATC 12/02/19  9:56 AM Phone: [phone] Fax: [phone]   [facility] Out[facility] [address] [place], [place], [address] Phone: [phone]   Fax:  [phone]  Name: Manuel Pearson MRN: [id number] Date of Birth: October 11, 1965

## 2019-12-11 ENCOUNTER — Other Ambulatory Visit: Payer: Self-pay

## 2019-12-11 ENCOUNTER — Encounter: Payer: Self-pay | Admitting: Physical Therapy

## 2019-12-11 ENCOUNTER — Ambulatory Visit: Payer: No Typology Code available for payment source | Admitting: Physical Therapy

## 2019-12-11 DIAGNOSIS — M545 Low back pain: Secondary | ICD-10-CM | POA: Diagnosis not present

## 2019-12-11 DIAGNOSIS — G8929 Other chronic pain: Secondary | ICD-10-CM

## 2019-12-11 DIAGNOSIS — M6281 Muscle weakness (generalized): Secondary | ICD-10-CM

## 2019-12-11 NOTE — Therapy (Signed)
[facility]patient Rehabilitation [facility] [address] [place], [place], [address] Phone: [phone]   Fax:  [phone]  Physical Therapy Treatment  Patient Details  Name: Manuel Pearson MRN: [id number] Date of Birth: Jul 13, 1965 Referring Provider (PT): Donalee Citrin, MD   Encounter Date: 12/11/2019   PT End of Session - 12/11/19 0916    Visit Number 10    Number of Visits 15    Date for PT Re-Evaluation 01/21/20    Authorization Type [place]    Authorization Time Period 10/19/2019 - 02/15/2020    Authorization - Visit Number 10    Authorization - Number of Visits 15    Progress Note Due on Visit 18    PT Start Time 0914    PT Stop Time 0955    PT Time Calculation (min) 41 min    Activity Tolerance Patient tolerated treatment well    Behavior During Therapy [facility] for tasks assessed/performed           Past Medical History:  Diagnosis Date  . Anxiety   . Elevated liver enzymes    has had increases due to headache meds, but back to normal   . GERD (gastroesophageal reflux disease)   . Headache(784.0)    MIGRAINES   . Hypertension   . Hypothyroidism   . PONV (postoperative nausea and vomiting)    DIFFICULTY WAKING UP   . PTSD (post-traumatic stress disorder)   . Sleep apnea 2019    Past Surgical History:  Procedure Laterality Date  . ANTERIOR CERVICAL DECOMP/DISCECTOMY FUSION  03/29/2011   Procedure: ANTERIOR CERVICAL DECOMPRESSION/DISCECTOMY FUSION 2 LEVELS;  Surgeon: Reinaldo Meeker, MD;  Location: [facility];  Service: Neurosurgery;  Laterality: N/A;  CERVICAL FOUR-FIVE, CERVICAL SIX-SEVEN  ANTERIOR CERVICAL DECOMPRESSION FUSION  WITH TRABECULAR METAL  AND PLATE  . APPENDECTOMY  03/2017  . DISTAL BICEPS TENDON REPAIR  2008   LEFT   . LUMBAR LAMINECTOMY/DECOMPRESSION MICRODISCECTOMY Right 09/11/2019   Procedure: Laminectomy and Foraminotomy - Lumbar Two-Three - right;  Surgeon: Donalee Citrin, MD;  Location: [facility] OR;  Service: Neurosurgery;  Laterality:  Right;  right  . NECK SURGERY  1995   FUSION   . SHOULDER SURGERY  2000   RIGHT     LEFT  2004     There were no vitals filed for this visit.   Subjective Assessment - 12/11/19 0912    Subjective Patient reports he is doing well. He had his bloodwork done and his sodium is still low.    Patient Stated Goals Patient wants to be able to do activity without limitations, working out, hiking, yardwork    Currently in Pain? No/denies                             [facility] Adult PT Treatment/Exercise - 12/11/19 0001      Exercises   Exercises Lumbar      Lumbar Exercises: Aerobic   Nustep L6 x 5 min (LE only)      Lumbar Exercises: Standing   Lifting 10 reps   3 sets   Lifting Limitations 45# deadlift    Row 10 reps   3 sets   Row Limitations Single UE with freemotion 17#    Other Standing Lumbar Exercises Pallof press with freemotion 13# 3x10 3 sec hold    Other Standing Lumbar Exercises Lateral band walk with blue band below knees 3x20      Lumbar  Exercises: Supine   Dead Bug 10 reps   2 sets   Dead Bug Limitations with physioball    Bridge with March 10 reps   2 sets                 PT Education - 12/11/19 0916    Education Details HEP    Person(s) Educated Patient    Methods Explanation    Comprehension Verbalized understanding;Need further instruction            PT Short Term Goals - 11/26/19 0923      PT SHORT TERM GOAL #1   Title Patient will be I with initial HEP to progress with PT    Status Achieved      PT SHORT TERM GOAL #2   Title PT will review FOTO and patient will understand room for progress    Status Achieved      PT SHORT TERM GOAL #3   Title Patient will exhibit proper lifting mechanics with hip hinge technique and neutral spine to be able to progress with exercise    Time --    Period --    Status Achieved    Target Date --      PT SHORT TERM GOAL #4   Title Patient will be able to walk and stand > 1 hour without  pain or limitation    Baseline Patient reports he is able to do it but his right thigh will go numb - 11/26/2019    Time 4    Period Weeks    Status On-going    Target Date 12/24/19             PT Long Term Goals - 11/26/19 0921      PT LONG TERM GOAL #1   Title Patient will be I with final HEP to maintain progress from PT    Time 8    Period Weeks    Status On-going    Target Date 01/21/20      PT LONG TERM GOAL #2   Title Patient will report improved functional level to </= 36% limitation on FOTO    Baseline 37% limitation    Time 8    Period Weeks    Status On-going    Target Date 01/21/20      PT LONG TERM GOAL #3   Title Patient will exhibit gross hip and core strength gross >/= 4+/5 MMT to improve ability to perform household activities    Time 8    Period Weeks    Status On-going    Target Date 01/21/20      PT LONG TERM GOAL #4   Title Patient will be able to lift >/= 25 lbs using proper form to be able to perform yardwork without limitation    Baseline Patient able to lift 30 lbs using proper hip hinge form, good control, and no pain    Status Achieved                 Plan - 12/11/19 0918    Clinical Impression Statement Patient tolerated therapy well with no adverse effects. He is progressing well with his core and hip strengthening in therapy but with his continued low sodium level still not progressing his home exercise program. He demonstrates good lifting mechanics and states that he has been doing some light lifting at home. He denies any increase in back or leg pain with therapy. He does continue to report some  numbness with standing extended periods but it is getting much better.    PT Treatment/Interventions ADLs/Self Care Home Management;Cryotherapy;Electrical Stimulation;Iontophoresis 4mg /ml Dexamethasone;Moist Heat;Neuromuscular re-education;Balance training;Therapeutic exercise;Therapeutic activities;Functional mobility training;Stair  training;Gait training;Patient/family education;Manual techniques;Dry needling;Passive range of motion;Taping;Spinal Manipulations;Joint Manipulations    PT Next Visit Plan Assess HEP and progress PRN, proper lifting mechanics with hip hinge technique, progress core and hip strength, (patient is being monitored for low sodium levels, MD recommended do not over exert but okay to attend PT session)    PT Home Exercise Plan [id number]    Consulted and Agree with Plan of Care Patient           Patient will benefit from skilled therapeutic intervention in order to improve the following deficits and impairments:  Decreased range of motion, Decreased activity tolerance, Pain, Decreased strength, Improper body mechanics  Visit Diagnosis: Chronic bilateral low back pain, unspecified whether sciatica present  Muscle weakness (generalized)     Problem List Patient Active Problem List   Diagnosis Date Noted  . Hospice care patient 09/12/2019  . Spinal stenosis of lumbar region 09/11/2019    09/13/2019, PT, DPT, LAT, ATC 12/11/19  9:56 AM Phone: [phone] Fax: [phone]   [facility] Outpatient Rehabilitation [facility] [address] [place], [place], [place] Phone: [phone]   Fax:  [phone]  Name: ENZO TREU MRN: Luan Pulling Date of Birth: 10/31/1965

## 2019-12-18 ENCOUNTER — Encounter: Payer: Self-pay | Admitting: Physical Therapy

## 2019-12-25 ENCOUNTER — Other Ambulatory Visit: Payer: Self-pay

## 2019-12-25 ENCOUNTER — Ambulatory Visit: Payer: No Typology Code available for payment source | Attending: Neurosurgery | Admitting: Physical Therapy

## 2019-12-25 ENCOUNTER — Encounter: Payer: Self-pay | Admitting: Physical Therapy

## 2019-12-25 DIAGNOSIS — G8929 Other chronic pain: Secondary | ICD-10-CM | POA: Diagnosis present

## 2019-12-25 DIAGNOSIS — M545 Low back pain, unspecified: Secondary | ICD-10-CM | POA: Insufficient documentation

## 2019-12-25 DIAGNOSIS — M6281 Muscle weakness (generalized): Secondary | ICD-10-CM | POA: Diagnosis present

## 2019-12-25 NOTE — Therapy (Signed)
[facility] Outpatient Rehabilitation [facility] [address] [place], [place], [address] Phone: [phone]   Fax:  [phone]  Physical Therapy Treatment  Patient Details  Name: Manuel Pearson MRN: [id number] Date of Birth: 1965/07/10 Referring Provider (PT): Donalee Citrin, MD   Encounter Date: 12/25/2019   PT End of Session - 12/25/19 0915    Visit Number 11    Number of Visits 15    Date for PT Re-Evaluation 01/21/20    Authorization Type [place]    Authorization Time Period 10/19/2019 - 02/15/2020    Authorization - Visit Number 11    Authorization - Number of Visits 15    Progress Note Due on Visit 18    PT Start Time 0910    PT Stop Time 0950    PT Time Calculation (min) 40 min    Activity Tolerance Patient tolerated treatment well    Behavior During Therapy [facility] for tasks assessed/performed           Past Medical History:  Diagnosis Date  . Anxiety   . Elevated liver enzymes    has had increases due to headache meds, but back to normal   . GERD (gastroesophageal reflux disease)   . Headache(784.0)    MIGRAINES   . Hypertension   . Hypothyroidism   . PONV (postoperative nausea and vomiting)    DIFFICULTY WAKING UP   . PTSD (post-traumatic stress disorder)   . Sleep apnea 2019    Past Surgical History:  Procedure Laterality Date  . ANTERIOR CERVICAL DECOMP/DISCECTOMY FUSION  03/29/2011   Procedure: ANTERIOR CERVICAL DECOMPRESSION/DISCECTOMY FUSION 2 LEVELS;  Surgeon: Reinaldo Meeker, MD;  Location: [facility];  Service: Neurosurgery;  Laterality: N/A;  CERVICAL FOUR-FIVE, CERVICAL SIX-SEVEN  ANTERIOR CERVICAL DECOMPRESSION FUSION  WITH TRABECULAR METAL  AND PLATE  . APPENDECTOMY  03/2017  . DISTAL BICEPS TENDON REPAIR  2008   LEFT   . LUMBAR LAMINECTOMY/DECOMPRESSION MICRODISCECTOMY Right 09/11/2019   Procedure: Laminectomy and Foraminotomy - Lumbar Two-Three - right;  Surgeon: Donalee Citrin, MD;  Location[facility];  Service: Neurosurgery;  Laterality:  Right;  right  . NECK SURGERY  1995   FUSION   . SHOULDER SURGERY  2000   RIGHT     LEFT  2004     There were no vitals filed for this visit.   Subjective Assessment - 12/25/19 0914    Subjective Patient reports he is doing well. No new complaints.    Patient Stated Goals Patient wants to be able to do activity without limitations, working out, hiking, yardwork    Currently in Pain? No/denies              [facility] PT Assessment - 12/25/19 0001      AROM   Overall AROM Comments Patient exhibits lumbar AROM grossly WFL and non-painful      Strength   Right Hip Extension 4/5    Right Hip ABduction 4/5    Left Hip Extension 4/5    Left Hip ABduction 4/5                         [facility] Adult PT Treatment/Exercise - 12/25/19 0001      Exercises   Exercises Lumbar      Lumbar Exercises: Aerobic   Nustep L6 x 6 min (LE only)      Lumbar Exercises: Standing   Lifting 10 reps   3 sets   Lifting Limitations  45# deadlift    Row Limitations Farmer's carry with 25# x 2 laps each    Other Standing Lumbar Exercises Pallof press with freemotion 13# 2 x 10 3 sec hold    Other Standing Lumbar Exercises Lateral band walk with blue band below knees 3x20      Lumbar Exercises: Supine   Dead Bug 10 reps   2 sets   Dead Bug Limitations with physioball    Bridge with March 10 reps   2 sets                 PT Education - 12/25/19 0915    Education Details HEP    Person(s) Educated Patient    Methods Explanation    Comprehension Verbalized understanding;Need further instruction            PT Short Term Goals - 11/26/19 0923      PT SHORT TERM GOAL #1   Title Patient will be I with initial HEP to progress with PT    Status Achieved      PT SHORT TERM GOAL #2   Title PT will review FOTO and patient will understand room for progress    Status Achieved      PT SHORT TERM GOAL #3   Title Patient will exhibit proper lifting mechanics with hip hinge technique  and neutral spine to be able to progress with exercise    Time --    Period --    Status Achieved    Target Date --      PT SHORT TERM GOAL #4   Title Patient will be able to walk and stand > 1 hour without pain or limitation    Baseline Patient reports he is able to do it but his right thigh will go numb - 11/26/2019    Time 4    Period Weeks    Status On-going    Target Date 12/24/19             PT Long Term Goals - 11/26/19 0921      PT LONG TERM GOAL #1   Title Patient will be I with final HEP to maintain progress from PT    Time 8    Period Weeks    Status On-going    Target Date 01/21/20      PT LONG TERM GOAL #2   Title Patient will report improved functional level to </= 36% limitation on FOTO    Baseline 37% limitation    Time 8    Period Weeks    Status On-going    Target Date 01/21/20      PT LONG TERM GOAL #3   Title Patient will exhibit gross hip and core strength gross >/= 4+/5 MMT to improve ability to perform household activities    Time 8    Period Weeks    Status On-going    Target Date 01/21/20      PT LONG TERM GOAL #4   Title Patient will be able to lift >/= 25 lbs using proper form to be able to perform yardwork without limitation    Baseline Patient able to lift 30 lbs using proper hip hinge form, good control, and no pain    Status Achieved                 Plan - 12/25/19 0916    Clinical Impression Statement Patient tolerated therapy well with no adverse effects. He is still restricted on  activity level due to sodium levels but is continuing to do well with his current strengthening program. He is progressing well with his core/hip strengthening and denies any low back pain or leg numbness. He would benefit from continued skilled PT to progress strength and maximize functional level    PT Treatment/Interventions ADLs/Self Care Home Management;Cryotherapy;Electrical Stimulation;Iontophoresis 4mg /ml Dexamethasone;Moist  Heat;Neuromuscular re-education;Balance training;Therapeutic exercise;Therapeutic activities;Functional mobility training;Stair training;Gait training;Patient/family education;Manual techniques;Dry needling;Passive range of motion;Taping;Spinal Manipulations;Joint Manipulations    PT Next Visit Plan Assess HEP and progress PRN, proper lifting mechanics with hip hinge technique, progress core and hip strength, (patient is being monitored for low sodium levels, MD recommended do not over exert but okay to attend PT session)    PT Home Exercise Plan [id number]    Consulted and Agree with Plan of Care Patient           Patient will benefit from skilled therapeutic intervention in order to improve the following deficits and impairments:  Decreased range of motion, Decreased activity tolerance, Pain, Decreased strength, Improper body mechanics  Visit Diagnosis: Chronic bilateral low back pain, unspecified whether sciatica present  Muscle weakness (generalized)     Problem List Patient Active Problem List   Diagnosis Date Noted  . Hospice care patient 09/12/2019  . Spinal stenosis of lumbar region 09/11/2019    09/13/2019, PT, DPT, LAT, ATC 12/25/19  9:50 AM Phone: [phone] Fax: [phone]   [facility] Outpatient Rehabilitation [facility] [address] [place], [place], [place] Phone: [phone]   Fax:  [phone]  Name: Manuel Pearson MRN: Luan Pulling Date of Birth: [birth year]

## 2020-01-01 ENCOUNTER — Other Ambulatory Visit: Payer: Self-pay

## 2020-01-01 ENCOUNTER — Ambulatory Visit: Payer: No Typology Code available for payment source | Admitting: Physical Therapy

## 2020-01-01 ENCOUNTER — Encounter: Payer: Self-pay | Admitting: Physical Therapy

## 2020-01-01 DIAGNOSIS — M545 Low back pain, unspecified: Secondary | ICD-10-CM

## 2020-01-01 DIAGNOSIS — M6281 Muscle weakness (generalized): Secondary | ICD-10-CM

## 2020-01-01 DIAGNOSIS — G8929 Other chronic pain: Secondary | ICD-10-CM

## 2020-01-01 NOTE — Therapy (Signed)
[facility] Outpatient Rehabilitation [facility] [address] [place], [place], [address] Phone: [phone]   Fax:  [phone]  Physical Therapy Treatment  Patient Details  Name: TRENTON PASSOW MRN: [id number] Date of Birth: [birth year] Referring Provider (PT): Donalee Citrin, MD   Encounter Date: 01/01/2020   PT End of Session - 01/01/20 0917    Visit Number 12    Number of Visits 15    Date for PT Re-Evaluation 01/21/20    Authorization Type [place]    Authorization Time Period 10/19/2019 - 02/15/2020    Authorization - Visit Number 12    Authorization - Number of Visits 15    Progress Note Due on Visit 18    PT Start Time 0913    PT Stop Time 0955    PT Time Calculation (min) 42 min    Activity Tolerance Patient tolerated treatment well    Behavior During Therapy [facility] for tasks assessed/performed           Past Medical History:  Diagnosis Date  . Anxiety   . Elevated liver enzymes    has had increases due to headache meds, but back to normal   . GERD (gastroesophageal reflux disease)   . Headache(784.0)    MIGRAINES   . Hypertension   . Hypothyroidism   . PONV (postoperative nausea and vomiting)    DIFFICULTY WAKING UP   . PTSD (post-traumatic stress disorder)   . Sleep apnea 2019    Past Surgical History:  Procedure Laterality Date  . ANTERIOR CERVICAL DECOMP/DISCECTOMY FUSION  03/29/2011   Procedure: ANTERIOR CERVICAL DECOMPRESSION/DISCECTOMY FUSION 2 LEVELS;  Surgeon: Reinaldo Meeker, MD;  Location: [facility];  Service: Neurosurgery;  Laterality: N/A;  CERVICAL FOUR-FIVE, CERVICAL SIX-SEVEN  ANTERIOR CERVICAL DECOMPRESSION FUSION  WITH TRABECULAR METAL  AND PLATE  . APPENDECTOMY  03/2017  . DISTAL BICEPS TENDON REPAIR  2008   LEFT   . LUMBAR LAMINECTOMY/DECOMPRESSION MICRODISCECTOMY Right 09/11/2019   Procedure: Laminectomy and Foraminotomy - Lumbar Two-Three - right;  Surgeon: Donalee Citrin, MD;  Location: [facility] OR;  Service: Neurosurgery;  Laterality:  Right;  right  . NECK SURGERY  1995   FUSION   . SHOULDER SURGERY  2000   RIGHT     LEFT  2004     There were no vitals filed for this visit.   Subjective Assessment - 01/01/20 0914    Subjective Patient reports he is doing well, sore from a lot of yardwork.    Patient Stated Goals Patient wants to be able to do activity without limitations, working out, hiking, yardwork    Currently in Pain? No/denies              [facility] PT Assessment - 01/01/20 0001      Strength   Overall Strength Comments Core strength grossly 4/5 MMT    Right Hip Extension 4/5    Right Hip ABduction 4/5    Left Hip Extension 4/5    Left Hip ABduction 4/5                         [facility] Adult PT Treatment/Exercise - 01/01/20 0001      Exercises   Exercises Lumbar      Lumbar Exercises: Aerobic   Nustep L6 x 5 min (LE only)      Lumbar Exercises: Standing   Lifting 10 reps   3 sets   Lifting Limitations 45# deadlift  Row Limitations Farmer's carry with 30# 2 x 2 laps each    Other Standing Lumbar Exercises Chop with FM 10# 2 x 10    Other Standing Lumbar Exercises Lateral band walk with blue band at ankles 3x20      Lumbar Exercises: Supine   Dead Bug 10 reps   3 sets   Dead Bug Limitations with physioball    Bridge with March 10 reps   2 sets   Single Leg Bridge 10 reps   figure-4 position                 PT Education - 01/01/20 0916    Education Details HEP    Person(s) Educated Patient    Methods Explanation    Comprehension Verbalized understanding;Need further instruction            PT Short Term Goals - 01/01/20 0923      PT SHORT TERM GOAL #1   Title Patient will be I with initial HEP to progress with PT    Status Achieved      PT SHORT TERM GOAL #2   Title PT will review FOTO and patient will understand room for progress    Status Achieved      PT SHORT TERM GOAL #3   Title Patient will exhibit proper lifting mechanics with hip hinge technique  and neutral spine to be able to progress with exercise    Status Achieved      PT SHORT TERM GOAL #4   Title Patient will be able to walk and stand > 1 hour without pain or limitation    Baseline Patient denies any pain with standing - 01/01/20    Time 4    Period Weeks    Status Achieved    Target Date 12/24/19             PT Long Term Goals - 11/26/19 0921      PT LONG TERM GOAL #1   Title Patient will be I with final HEP to maintain progress from PT    Time 8    Period Weeks    Status On-going    Target Date 01/21/20      PT LONG TERM GOAL #2   Title Patient will report improved functional level to </= 36% limitation on FOTO    Baseline 37% limitation    Time 8    Period Weeks    Status On-going    Target Date 01/21/20      PT LONG TERM GOAL #3   Title Patient will exhibit gross hip and core strength gross >/= 4+/5 MMT to improve ability to perform household activities    Time 8    Period Weeks    Status On-going    Target Date 01/21/20      PT LONG TERM GOAL #4   Title Patient will be able to lift >/= 25 lbs using proper form to be able to perform yardwork without limitation    Baseline Patient able to lift 30 lbs using proper hip hinge form, good control, and no pain    Status Achieved                 Plan - 01/01/20 0918    Clinical Impression Statement Patient tolerated therapy well with no adverse effects. He is progressing well with his strengthening and lifting activities but is still limited in activity level due to sodium levels. He denies any back pain with  therapy. He does require occasional cueing for proper lifting mechanics but is much improved overall. He would benefit from continued skilled PT to progress strength and maximize functional level    PT Treatment/Interventions ADLs/Self Care Home Management;Cryotherapy;Electrical Stimulation;Iontophoresis 4mg /ml Dexamethasone;Moist Heat;Neuromuscular re-education;Balance training;Therapeutic  exercise;Therapeutic activities;Functional mobility training;Stair training;Gait training;Patient/family education;Manual techniques;Dry needling;Passive range of motion;Taping;Spinal Manipulations;Joint Manipulations    PT Next Visit Plan Assess HEP and progress PRN, proper lifting mechanics with hip hinge technique, progress core and hip strength, (patient is being monitored for low sodium levels, MD recommended do not over exert but okay to attend PT session)    PT Home Exercise Plan [id number]    Consulted and Agree with Plan of Care Patient           Patient will benefit from skilled therapeutic intervention in order to improve the following deficits and impairments:  Decreased range of motion, Decreased activity tolerance, Pain, Decreased strength, Improper body mechanics  Visit Diagnosis: Chronic bilateral low back pain, unspecified whether sciatica present  Muscle weakness (generalized)     Problem List Patient Active Problem List   Diagnosis Date Noted  . Hospice care patient 09/12/2019  . Spinal stenosis of lumbar region 09/11/2019    09/13/2019, PT, DPT, LAT, ATC 01/01/20  9:55 AM Phone: [phone] Fax: [phone]   [facility] Outpatient Rehabilitation [place] [address] [place], [place], [place] Phone: [phone]   Fax:  [phone]  Name: NESBIT MICHON MRN: Luan Pulling Date of Birth: 1965/10/29

## 2020-01-10 ENCOUNTER — Other Ambulatory Visit: Payer: Self-pay

## 2020-01-10 ENCOUNTER — Ambulatory Visit: Payer: No Typology Code available for payment source | Admitting: Physical Therapy

## 2020-01-10 ENCOUNTER — Encounter: Payer: Self-pay | Admitting: Physical Therapy

## 2020-01-10 DIAGNOSIS — M545 Low back pain, unspecified: Secondary | ICD-10-CM | POA: Diagnosis not present

## 2020-01-10 DIAGNOSIS — M6281 Muscle weakness (generalized): Secondary | ICD-10-CM

## 2020-01-10 DIAGNOSIS — G8929 Other chronic pain: Secondary | ICD-10-CM

## 2020-01-10 NOTE — Therapy (Signed)
[facility] Outpatient Rehabilitation [facility] [address] [place], [place], [address] Phone: [phone]   Fax:  [phone]  Physical Therapy Treatment  Patient Details  Name: Manuel Pearson MRN: [id number] Date of Birth: [birth year] Referring Provider (PT): Donalee Citrin, MD   Encounter Date: 01/10/2020   PT End of Session - 01/10/20 1128    Visit Number 13    Number of Visits 15    Date for PT Re-Evaluation 01/21/20    Authorization Type [place]    Authorization Time Period 10/19/2019 - 02/15/2020    Authorization - Visit Number 13    Authorization - Number of Visits 15    Progress Note Due on Visit 18    PT Start Time 1125    PT Stop Time 1205    PT Time Calculation (min) 40 min    Activity Tolerance Patient tolerated treatment well    Behavior During Therapy [facility] for tasks assessed/performed           Past Medical History:  Diagnosis Date  . Anxiety   . Elevated liver enzymes    has had increases due to headache meds, but back to normal   . GERD (gastroesophageal reflux disease)   . Headache(784.0)    MIGRAINES   . Hypertension   . Hypothyroidism   . PONV (postoperative nausea and vomiting)    DIFFICULTY WAKING UP   . PTSD (post-traumatic stress disorder)   . Sleep apnea 2019    Past Surgical History:  Procedure Laterality Date  . ANTERIOR CERVICAL DECOMP/DISCECTOMY FUSION  03/29/2011   Procedure: ANTERIOR CERVICAL DECOMPRESSION/DISCECTOMY FUSION 2 LEVELS;  Surgeon: Reinaldo Meeker, MD;  Location: [facility];  Service: Neurosurgery;  Laterality: N/A;  CERVICAL FOUR-FIVE, CERVICAL SIX-SEVEN  ANTERIOR CERVICAL DECOMPRESSION FUSION  WITH TRABECULAR METAL  AND PLATE  . APPENDECTOMY  03/2017  . DISTAL BICEPS TENDON REPAIR  2008   LEFT   . LUMBAR LAMINECTOMY/DECOMPRESSION MICRODISCECTOMY Right 09/11/2019   Procedure: Laminectomy and Foraminotomy - Lumbar Two-Three - right;  Surgeon: Donalee Citrin, MD;  Location: [facility] OR;  Service: Neurosurgery;  Laterality:  Right;  right  . NECK SURGERY  1995   FUSION   . SHOULDER SURGERY  2000   RIGHT     LEFT  2004     There were no vitals filed for this visit.   Subjective Assessment - 01/10/20 1126    Subjective Patient reports he has been feeling tired due to not sleeping well. He also notes 2 recent seizures so is not allowed to drive anymore. His back has been feeling good.    Patient Stated Goals Patient wants to be able to do activity without limitations, working out, hiking, yardwork    Currently in Pain? No/denies              [facility] PT Assessment - 01/10/20 0001      Strength   Overall Strength Comments Core strength grossly 4/5 MMT    Right Hip Extension 4/5    Right Hip ABduction 4/5    Left Hip Extension 4/5    Left Hip ABduction 4/5                         [facility] Adult PT Treatment/Exercise - 01/10/20 0001      Exercises   Exercises Lumbar      Lumbar Exercises: Aerobic   Nustep L6 x 5 min (LE only)      Lumbar Exercises:  Standing   Lifting 10 reps   3 sets   Lifting Limitations 45# deadlift    Row Limitations Farmer's carry with 30# 2 x 2 laps each    Other Standing Lumbar Exercises Chop with FM 10# 2 x 10    Other Standing Lumbar Exercises Lateral band walk with blue band at ankles 3x20      Lumbar Exercises: Supine   Single Leg Bridge 10 reps;2 seconds   2 sets, figure-4 position   Other Supine Lumbar Exercises 90-90 alternating leg extensions with physioball 2 x 10 each      Lumbar Exercises: Quadruped   Opposite Arm/Leg Raise 10 reps;5 seconds   2 sets                 PT Education - 01/10/20 1128    Education Details HEP    Person(s) Educated Patient    Methods Explanation    Comprehension Verbalized understanding;Need further instruction            PT Short Term Goals - 01/01/20 0923      PT SHORT TERM GOAL #1   Title Patient will be I with initial HEP to progress with PT    Status Achieved      PT SHORT TERM GOAL #2   Title  PT will review FOTO and patient will understand room for progress    Status Achieved      PT SHORT TERM GOAL #3   Title Patient will exhibit proper lifting mechanics with hip hinge technique and neutral spine to be able to progress with exercise    Status Achieved      PT SHORT TERM GOAL #4   Title Patient will be able to walk and stand > 1 hour without pain or limitation    Baseline Patient denies any pain with standing - 01/01/20    Time 4    Period Weeks    Status Achieved    Target Date 12/24/19             PT Long Term Goals - 11/26/19 0921      PT LONG TERM GOAL #1   Title Patient will be I with final HEP to maintain progress from PT    Time 8    Period Weeks    Status On-going    Target Date 01/21/20      PT LONG TERM GOAL #2   Title Patient will report improved functional level to </= 36% limitation on FOTO    Baseline 37% limitation    Time 8    Period Weeks    Status On-going    Target Date 01/21/20      PT LONG TERM GOAL #3   Title Patient will exhibit gross hip and core strength gross >/= 4+/5 MMT to improve ability to perform household activities    Time 8    Period Weeks    Status On-going    Target Date 01/21/20      PT LONG TERM GOAL #4   Title Patient will be able to lift >/= 25 lbs using proper form to be able to perform yardwork without limitation    Baseline Patient able to lift 30 lbs using proper hip hinge form, good control, and no pain    Status Achieved                 Plan - 01/10/20 1128    Clinical Impression Statement Patient tolerated therapy well with no  adverse effects. Continued to progress strength of core and hips with good tolerance. He seems to be doing really well with his strengthening and denies any pain recently. He did not require any cueing for lifting technique. He would benefit from continued skilled PT to progress strength, finalize HEP, and maximize functional level.    PT Treatment/Interventions ADLs/Self Care  Home Management;Cryotherapy;Electrical Stimulation;Iontophoresis 4mg /ml Dexamethasone;Moist Heat;Neuromuscular re-education;Balance training;Therapeutic exercise;Therapeutic activities;Functional mobility training;Stair training;Gait training;Patient/family education;Manual techniques;Dry needling;Passive range of motion;Taping;Spinal Manipulations;Joint Manipulations    PT Next Visit Plan Assess HEP and progress PRN, proper lifting mechanics with hip hinge technique, progress core and hip strength, (patient is being monitored for low sodium levels, MD recommended do not over exert but okay to attend PT session)    PT Home Exercise Plan [id number]    Consulted and Agree with Plan of Care Patient           Patient will benefit from skilled therapeutic intervention in order to improve the following deficits and impairments:  Decreased range of motion, Decreased activity tolerance, Pain, Decreased strength, Improper body mechanics  Visit Diagnosis: Chronic bilateral low back pain, unspecified whether sciatica present  Muscle weakness (generalized)     Problem List Patient Active Problem List   Diagnosis Date Noted  . Hospice care patient 09/12/2019  . Spinal stenosis of lumbar region 09/11/2019    09/13/2019, PT, DPT, LAT, ATC 01/10/20  12:06 PM Phone: [phone] Fax: [phone]   [facility] Outpatient Rehabilitation [facility] [address] [place], [place], [place] Phone: [phone]   Fax:  [phone]  Name: JUANDAVID DALLMAN MRN: Luan Pulling Date of Birth: January 16, 1966

## 2020-01-15 ENCOUNTER — Ambulatory Visit: Payer: No Typology Code available for payment source | Admitting: Physical Therapy

## 2020-01-22 ENCOUNTER — Ambulatory Visit: Payer: No Typology Code available for payment source | Attending: Neurosurgery | Admitting: Physical Therapy

## 2020-01-22 ENCOUNTER — Other Ambulatory Visit: Payer: Self-pay

## 2020-01-22 ENCOUNTER — Encounter: Payer: Self-pay | Admitting: Physical Therapy

## 2020-01-22 DIAGNOSIS — M6281 Muscle weakness (generalized): Secondary | ICD-10-CM

## 2020-01-22 DIAGNOSIS — M545 Low back pain, unspecified: Secondary | ICD-10-CM | POA: Diagnosis not present

## 2020-01-22 DIAGNOSIS — G8929 Other chronic pain: Secondary | ICD-10-CM | POA: Diagnosis present

## 2020-01-22 NOTE — Therapy (Signed)
[facility] Outpatient Rehabilitation [facility] [address] [place], [place], [address] Phone: [phone]   Fax:  [phone]  Physical Therapy Treatment / ERO  Patient Details  Name: Manuel Pearson MRN: [id number] Date of Birth: September 29, 1965 Referring Provider (PT): Donalee Citrin, MD   Encounter Date: 01/22/2020   PT End of Session - 01/22/20 1002    Visit Number 14    Number of Visits 15    Date for PT Re-Evaluation 02/12/20    Authorization Type [place]    Authorization Time Period 10/19/2019 - 02/15/2020    Authorization - Visit Number 14    Authorization - Number of Visits 15    PT Start Time 0958    PT Stop Time 1037    PT Time Calculation (min) 39 min    Activity Tolerance Patient tolerated treatment well    Behavior During Therapy [facility] for tasks assessed/performed           Past Medical History:  Diagnosis Date  . Anxiety   . Elevated liver enzymes    has had increases due to headache meds, but back to normal   . GERD (gastroesophageal reflux disease)   . Headache(784.0)    MIGRAINES   . Hypertension   . Hypothyroidism   . PONV (postoperative nausea and vomiting)    DIFFICULTY WAKING UP   . PTSD (post-traumatic stress disorder)   . Sleep apnea 2019    Past Surgical History:  Procedure Laterality Date  . ANTERIOR CERVICAL DECOMP/DISCECTOMY FUSION  03/29/2011   Procedure: ANTERIOR CERVICAL DECOMPRESSION/DISCECTOMY FUSION 2 LEVELS;  Surgeon: Reinaldo Meeker, MD;  Location: [facility];  Service: Neurosurgery;  Laterality: N/A;  CERVICAL FOUR-FIVE, CERVICAL SIX-SEVEN  ANTERIOR CERVICAL DECOMPRESSION FUSION  WITH TRABECULAR METAL  AND PLATE  . APPENDECTOMY  03/2017  . DISTAL BICEPS TENDON REPAIR  2008   LEFT   . LUMBAR LAMINECTOMY/DECOMPRESSION MICRODISCECTOMY Right 09/11/2019   Procedure: Laminectomy and Foraminotomy - Lumbar Two-Three - right;  Surgeon: Donalee Citrin, MD;  Location: [facility] OR;  Service: Neurosurgery;  Laterality: Right;  right  . NECK SURGERY   1995   FUSION   . SHOULDER SURGERY  2000   RIGHT     LEFT  2004     There were no vitals filed for this visit.   Subjective Assessment - 01/22/20 1001    Subjective Patient reports he currently has a migaine due to medication side effects. Otherwise he is doing well.    How long can you sit comfortably? No limitation    How long can you stand comfortably? No limitation    How long can you walk comfortably? No limitation    Patient Stated Goals Patient wants to be able to do activity without limitations, working out, hiking, yardwork    Currently in Pain? No/denies              [facility] PT Assessment - 01/22/20 0001      Assessment   Medical Diagnosis Decompressive laminotomy on right L2-3 with microdissection right L3 nerve root and microscopic foraminotomy with partial medial facetectomy    Referring Provider (PT) Donalee Citrin, MD    Onset Date/Surgical Date 09/11/19      Precautions   Precautions None      Restrictions   Weight Bearing Restrictions No      Balance Screen   Has the patient fallen in the past 6 months No      Prior Function   Level of Independence  Independent    Vocation Retired    Sports coach work, hiking, walking, exercising      Cognition   Overall Cognitive Status Within Functional Limits for tasks assessed      Observation/Other Assessments   Observations Patient appears in no apparent distress    Focus on Therapeutic Outcomes (FOTO)  17% limitation      Strength   Overall Strength Comments Core strength grossly 4+/5 MMT    Right Hip Flexion 4+/5    Right Hip Extension 4+/5    Right Hip ABduction 4+/5    Left Hip Flexion 4+/5    Left Hip Extension 4+/5    Left Hip ABduction 4+/5    Right Knee Flexion 5/5    Right Knee Extension 5/5    Left Knee Flexion 5/5    Left Knee Extension 5/5      Transfers   Transfers Independent with all Transfers      Ambulation/Gait   Gait Pattern Within Functional Limits                          [facility] Adult PT Treatment/Exercise - 01/22/20 0001      Exercises   Exercises Lumbar      Lumbar Exercises: Aerobic   Nustep L6 x 6 min (LE only)      Lumbar Exercises: Standing   Lifting 10 reps   3 sets   Lifting Limitations 45# deadlift    Other Standing Lumbar Exercises Chop with FM 10# 3 x 10      Lumbar Exercises: Supine   Bridge with March 10 reps   2 sets   Straight Leg Raise 20 reps   2 sets   Other Supine Lumbar Exercises 90-90 alternating leg extensions with physioball 2 x 10 each      Lumbar Exercises: Quadruped   Opposite Arm/Leg Raise 10 reps;5 seconds   2 sets                 PT Education - 01/22/20 1002    Education Details HEP, FOTO    Person(s) Educated Patient    Methods Explanation    Comprehension Verbalized understanding;Need further instruction            PT Short Term Goals - 01/01/20 0923      PT SHORT TERM GOAL #1   Title Patient will be I with initial HEP to progress with PT    Status Achieved      PT SHORT TERM GOAL #2   Title PT will review FOTO and patient will understand room for progress    Status Achieved      PT SHORT TERM GOAL #3   Title Patient will exhibit proper lifting mechanics with hip hinge technique and neutral spine to be able to progress with exercise    Status Achieved      PT SHORT TERM GOAL #4   Title Patient will be able to walk and stand > 1 hour without pain or limitation    Baseline Patient denies any pain with standing - 01/01/20    Time 4    Period Weeks    Status Achieved    Target Date 12/24/19             PT Long Term Goals - 01/22/20 1013      PT LONG TERM GOAL #1   Title Patient will be I with final HEP to maintain progress from PT  Baseline Will finalize HEP next visit    Time 3    Period Weeks    Status On-going    Target Date 02/12/20      PT LONG TERM GOAL #2   Title Patient will report improved functional level to </= 36% limitation on FOTO     Baseline 17% limitation    Time 8    Period Weeks    Status Achieved      PT LONG TERM GOAL #3   Title Patient will exhibit gross hip and core strength gross >/= 4+/5 MMT to improve ability to perform household activities    Baseline Grossly 4+/5 MMT throught    Time 8    Period Weeks    Status Achieved      PT LONG TERM GOAL #4   Title Patient will be able to lift >/= 25 lbs using proper form to be able to perform yardwork without limitation    Baseline Patient able to lift 45 lbs using proper hip hinge form, good control, and no pain    Status Achieved                 Plan - 01/22/20 1006    Clinical Impression Statement Patient tolerated therapy well with no adverse effects. He was experiencing a migraine during therapy so required more frequent and longer rest breaks to avoid exacrbation. He continues to do well and is progressing toward long term goals. He demonstrates improvement in strength function, and will schedule one more visit to finalize HEP and ensure independence to continue progressing his function.    Rehab Potential Good    PT Frequency --   1 visit scheduled in 3 weeks   PT Duration 3 weeks    PT Treatment/Interventions ADLs/Self Care Home Management;Cryotherapy;Electrical Stimulation;Iontophoresis 4mg /ml Dexamethasone;Moist Heat;Neuromuscular re-education;Balance training;Therapeutic exercise;Therapeutic activities;Functional mobility training;Stair training;Gait training;Patient/family education;Manual techniques;Dry needling;Passive range of motion;Taping;Spinal Manipulations;Joint Manipulations    PT Next Visit Plan Finalize HEP and ensure independence, plan discharge    PT Home Exercise Plan [id number]    Consulted and Agree with Plan of Care Patient           Patient will benefit from skilled therapeutic intervention in order to improve the following deficits and impairments:  Decreased range of motion, Decreased activity tolerance, Pain, Decreased  strength, Improper body mechanics  Visit Diagnosis: Chronic bilateral low back pain, unspecified whether sciatica present  Muscle weakness (generalized)     Problem List Patient Active Problem List   Diagnosis Date Noted  . Hospice care patient 09/12/2019  . Spinal stenosis of lumbar region 09/11/2019    09/13/2019, PT, DPT, LAT, ATC 01/22/20  10:39 AM Phone: [phone] Fax: [phone]   [facility] Outpatient Rehabilitation [facility] [address] [place], [place], [place] Phone: [phone]   Fax:  [phone]  Name: Manuel Pearson MRN: Luan Pulling Date of Birth: [birth year]

## 2020-02-12 ENCOUNTER — Other Ambulatory Visit: Payer: Self-pay

## 2020-02-12 ENCOUNTER — Ambulatory Visit: Payer: No Typology Code available for payment source | Attending: Neurosurgery | Admitting: Physical Therapy

## 2020-02-12 ENCOUNTER — Encounter: Payer: Self-pay | Admitting: Physical Therapy

## 2020-02-12 DIAGNOSIS — M6281 Muscle weakness (generalized): Secondary | ICD-10-CM | POA: Diagnosis present

## 2020-02-12 DIAGNOSIS — G8929 Other chronic pain: Secondary | ICD-10-CM

## 2020-02-12 DIAGNOSIS — M545 Low back pain, unspecified: Secondary | ICD-10-CM | POA: Diagnosis not present

## 2020-02-12 NOTE — Therapy (Signed)
[place], [place], [address] Phone: [phone]   Fax:  [phone]  Physical Therapy Treatment / Discharge  Patient Details  Name: Manuel Pearson MRN: [id number] Date of Birth: [birth year] Referring Provider (PT): Kary Kos, MD   Encounter Date: 02/12/2020   PT End of Session - 02/12/20 1010    Visit Number 15    Number of Visits 15    Date for PT Re-Evaluation 02/12/20    Authorization Type [facility]    Authorization Time Period 10/19/2019 - 02/15/2020    Authorization - Visit Number 15    Authorization - Number of Visits 15    PT Start Time 1000    PT Stop Time 1040    PT Time Calculation (min) 40 min    Activity Tolerance Patient tolerated treatment well    Behavior During Therapy [facility] for tasks assessed/performed           Past Medical History:  Diagnosis Date  . Anxiety   . Elevated liver enzymes    has had increases due to headache meds, but back to normal   . GERD (gastroesophageal reflux disease)   . Headache(784.0)    MIGRAINES   . Hypertension   . Hypothyroidism   . PONV (postoperative nausea and vomiting)    DIFFICULTY WAKING UP   . PTSD (post-traumatic stress disorder)   . Sleep apnea 2019    Past Surgical History:  Procedure Laterality Date  . ANTERIOR CERVICAL DECOMP/DISCECTOMY FUSION  03/29/2011   Procedure: ANTERIOR CERVICAL DECOMPRESSION/DISCECTOMY FUSION 2 LEVELS;  Surgeon: Faythe Ghee, MD;  Location: [place] [facility];  Service: Neurosurgery;  Laterality: N/A;  CERVICAL FOUR-FIVE, CERVICAL SIX-SEVEN  ANTERIOR CERVICAL DECOMPRESSION FUSION  WITH TRABECULAR METAL  AND PLATE  . APPENDECTOMY  03/2017  . DISTAL BICEPS TENDON REPAIR  2008   LEFT   . LUMBAR LAMINECTOMY/DECOMPRESSION MICRODISCECTOMY Right 09/11/2019   Procedure: Laminectomy and Foraminotomy - Lumbar Two-Three - right;  Surgeon: Kary Kos, MD;  Location: [facility];  Service: Neurosurgery;  Laterality: Right;  right  . [place]   . [place]   RIGHT     LEFT  2004     There were no vitals filed for this visit.   Subjective Assessment - 02/12/20 1008    Subjective Patient reports he has had a headache for 4 days due to medications. Back is sore from push-mowing his yard. He doesn't feel limited with any activity and is independent with HEP.    How long can you sit comfortably? No limitation    How long can you stand comfortably? No limitation    How long can you walk comfortably? No limitation    Patient Stated Goals Patient wants to be able to do activity without limitations, working out, hiking, yardwork    Currently in Pain? No/denies              [facility] PT Assessment - 02/12/20 0001      Assessment   Medical Diagnosis Decompressive laminotomy on right L2-3 with microdissection right L3 nerve root and microscopic foraminotomy with partial medial facetectomy    Referring Provider (PT) Kary Kos, MD    Onset Date/Surgical Date 09/11/19      Precautions   Precautions None      Restrictions   Weight Bearing Restrictions No      Balance Screen   Has the patient fallen in the past  6 months No      Prior Function   Level of Independence Independent    Vocation Retired    Programmer, multimedia work, hiking, walking, exercising      Cognition   Overall Cognitive Status Within Functional Limits for tasks assessed      Observation/Other Assessments   Observations Patient appears in no apparent distress    Focus on Therapeutic Outcomes (FOTO)  17% limitation      Strength   Overall Strength Comments Core strength grossly 4+/5 MMT    Right Hip Flexion 4+/5    Right Hip Extension 4+/5    Right Hip ABduction 4+/5    Left Hip Flexion 4+/5    Left Hip Extension 4+/5    Left Hip ABduction 4+/5    Right Knee Flexion 5/5    Right Knee Extension 5/5    Left Knee Flexion 5/5    Left Knee Extension 5/5      Transfers   Transfers Independent with all Transfers       Ambulation/Gait   Gait Pattern Within Functional Limits                         [facility] Adult PT Treatment/Exercise - 02/12/20 0001      Exercises   Exercises Lumbar      Lumbar Exercises: Aerobic   Nustep L6 x 5 min (LE only)      Lumbar Exercises: Standing   Lifting 10 reps   2 sets   Lifting Limitations 45# deadlift    Row 10 reps   2 sets   Row Limitations Single UE with freemotion 17#    Other Standing Lumbar Exercises Chop with FM 10# 3 x 10    Other Standing Lumbar Exercises Lateral band walk with blue band at knees 3x20      Lumbar Exercises: Supine   Bridge with March 10 reps   2 sets   Other Supine Lumbar Exercises 90-90 alternating leg extensions with physioball 2 x 10 each      Lumbar Exercises: Quadruped   Opposite Arm/Leg Raise 10 reps;5 seconds   2 sets                 PT Education - 02/12/20 1010    Education Details HEP, POC discharge    Person(s) Educated Patient    Methods Explanation    Comprehension Verbalized understanding            PT Short Term Goals - 01/01/20 9379      PT SHORT TERM GOAL #1   Title Patient will be I with initial HEP to progress with PT    Status Achieved      PT SHORT TERM GOAL #2   Title PT will review FOTO and patient will understand room for progress    Status Achieved      PT SHORT TERM GOAL #3   Title Patient will exhibit proper lifting mechanics with hip hinge technique and neutral spine to be able to progress with exercise    Status Achieved      PT SHORT TERM GOAL #4   Title Patient will be able to walk and stand > 1 hour without pain or limitation    Baseline Patient denies any pain with standing - 01/01/20    Time 4    Period Weeks    Status Achieved    Target Date 12/24/19  PT Long Term Goals - 02/12/20 1020      PT LONG TERM GOAL #1   Title Patient will be I with final HEP to maintain progress from PT    Baseline Patient I with HEP    Time 3    Period Weeks     Status Achieved      PT LONG TERM GOAL #2   Title Patient will report improved functional level to </= 36% limitation on FOTO    Baseline 17% limitation    Time 8    Period Weeks    Status Achieved      PT LONG TERM GOAL #3   Title Patient will exhibit gross hip and core strength gross >/= 4+/5 MMT to improve ability to perform household activities    Baseline Grossly 4+/5 MMT throught    Time 8    Period Weeks    Status Achieved      PT LONG TERM GOAL #4   Title Patient will be able to lift >/= 25 lbs using proper form to be able to perform yardwork without limitation    Baseline Patient able to lift 45 lbs using proper hip hinge form, good control, and no pain    Status Achieved                 Plan - 02/12/20 1011    Clinical Impression Statement Patient has achieved all establish goals and notes no activity limitations, and is independent with all HEP. Patient will be formally discharged from PT and was instructed on methods to follow-up PRN.    PT Next Visit Plan NA - discharge    PT [place] and Agree with Plan of Care Patient           Patient will benefit from skilled therapeutic intervention in order to improve the following deficits and impairments: NA    Visit Diagnosis: Chronic bilateral low back pain, unspecified whether sciatica present  Muscle weakness (generalized)     Problem List Patient Active Problem List   Diagnosis Date Noted  . Hospice care patient 09/12/2019  . Spinal stenosis of lumbar region 09/11/2019    Hilda Blades, PT, DPT, LAT, ATC 02/12/20  10:41 AM Phone: [phone] Fax: [facility] [address] [place], [place], [address] Phone: [phone]   Fax:  [phone]  Name: Manuel Pearson MRN: [id number] Date of Birth: May 19, 1965   PHYSICAL THERAPY DISCHARGE SUMMARY  Visits from Start of Care: 15  Current  functional level related to goals / functional outcomes: See above   Remaining deficits: See above   Education / Equipment: HEP  Plan: Patient agrees to discharge.  Patient goals were met. Patient is being discharged due to meeting the stated rehab goals.  ?????

## 2020-03-14 DIAGNOSIS — J189 Pneumonia, unspecified organism: Secondary | ICD-10-CM

## 2020-03-14 HISTORY — DX: Pneumonia, unspecified organism: J18.9

## 2020-04-14 HISTORY — PX: ANKLE FRACTURE SURGERY: SHX122

## 2020-08-20 ENCOUNTER — Ambulatory Visit: Payer: No Typology Code available for payment source | Admitting: Physical Therapy

## 2020-08-21 ENCOUNTER — Ambulatory Visit: Payer: No Typology Code available for payment source | Attending: Orthopedic Surgery

## 2020-08-21 ENCOUNTER — Other Ambulatory Visit: Payer: Self-pay

## 2020-08-21 DIAGNOSIS — M79661 Pain in right lower leg: Secondary | ICD-10-CM

## 2020-08-21 DIAGNOSIS — M6281 Muscle weakness (generalized): Secondary | ICD-10-CM | POA: Diagnosis present

## 2020-08-21 DIAGNOSIS — R2689 Other abnormalities of gait and mobility: Secondary | ICD-10-CM

## 2020-08-23 NOTE — Therapy (Signed)
[facility] Outpatient Rehabilitation [facility] [address] [place], [place], [address] Phone: [phone]   Fax:  [phone]  Physical Therapy Evaluation  Patient Details  Name: Manuel Pearson MRN: [id number] Date of Birth: [birth year] Referring Provider (PT): Dannielle Huh, MD   Encounter Date: 08/21/2020    Past Medical History:  Diagnosis Date   Anxiety    Elevated liver enzymes    has had increases due to headache meds, but back to normal    GERD (gastroesophageal reflux disease)    Headache(784.0)    MIGRAINES    Hypertension    Hypothyroidism    PONV (postoperative nausea and vomiting)    DIFFICULTY WAKING UP    PTSD (post-traumatic stress disorder)    Sleep apnea 2019    Past Surgical History:  Procedure Laterality Date   ANTERIOR CERVICAL DECOMP/DISCECTOMY FUSION  03/29/2011   Procedure: ANTERIOR CERVICAL DECOMPRESSION/DISCECTOMY FUSION 2 LEVELS;  Surgeon: Reinaldo Meeker, MD;  Location: [facility];  Service: Neurosurgery;  Laterality: N/A;  CERVICAL FOUR-FIVE, CERVICAL SIX-SEVEN  ANTERIOR CERVICAL DECOMPRESSION FUSION  WITH TRABECULAR METAL  AND PLATE   APPENDECTOMY  03/2017   DISTAL BICEPS TENDON REPAIR  2008   LEFT    LUMBAR LAMINECTOMY/DECOMPRESSION MICRODISCECTOMY Right 09/11/2019   Procedure: Laminectomy and Foraminotomy - Lumbar Two-Three - right;  Surgeon: Donalee Citrin, MD;  Location: [facility];  Service: Neurosurgery;  Laterality: Right;  right   NECK SURGERY  1995   FUSION    SHOULDER SURGERY  2000   RIGHT     LEFT  2004     There were no vitals filed for this visit.        [facility] PT Assessment - 08/23/20 0001       Assessment   Medical Diagnosis S/P R Distal fibula fx    Referring Provider (PT) Dannielle Huh, MD    Hand Dominance Right      Precautions   Precautions Fall      Restrictions   Weight Bearing Restrictions Yes    RLE Weight Bearing Weight bearing as tolerated      Balance Screen   Has the patient fallen in the  past 6 months Yes    How many times? once - fall leading to fx    Has the patient had a decrease in activity level because of a fear of falling?  Yes    Is the patient reluctant to leave their home because of a fear of falling?  Yes      Home Environment   Living Environment Private residence    Living Arrangements Spouse/significant other    Type of Home House    Home Access Stairs to enter    Entrance Stairs-Number of Steps 2    Entrance Stairs-Rails None    Home Layout One level    Home Equipment Walker - 2 wheels;Tub bench;Crutches      Prior Function   Level of Independence Independent;Independent with basic ADLs      Cognition   Overall Cognitive Status Within Functional Limits for tasks assessed    Attention Focused      Observation/Other Assessments   Focus on Therapeutic Outcomes (FOTO)  No FOTO      AROM   Right/Left Ankle Right;Left    Right Ankle Dorsiflexion 2    Left Ankle Dorsiflexion 8      Strength   Right/Left Ankle Right;Left    Right Ankle Dorsiflexion --   DNT   Right Ankle  Plantar Flexion --   DNT   Right Ankle Inversion --   DNT   Right Ankle Eversion --   DNT   Left Ankle Dorsiflexion 5/5    Left Ankle Plantar Flexion 5/5    Left Ankle Inversion 5/5    Left Ankle Eversion 5/5      Palpation   Palpation comment TTP to R lateral ankle, R lateral malleous, R distal foot      Transfers   Five time sit to stand comments  18 seconds - fx boot donned      Ambulation/Gait   Ambulation/Gait Yes    Ambulation/Gait Assistance 6: Modified independent (Device/Increase time)    Ambulation Distance (Feet) 100 Feet    Assistive device Standard walker    Gait Pattern Trunk flexed;Trendelenburg;Antalgic    Ambulation Surface Level      Standardized Balance Assessment   Standardized Balance Assessment Timed Up and Go Test      Timed Up and Go Test   Normal TUG (seconds) 25   w/ standard walker                       Objective  measurements completed on examination: See above findings.       [facility] Adult PT Treatment/Exercise - 08/23/20 0001       Ankle Exercises: Stretches   Gastroc Stretch Limitations x 30 sec with towel R LE      Ankle Exercises: Seated   Ankle Circles/Pumps Limitations x 20 ea      Ankle Exercises: Supine   T-Band DF/PF YTB x 10 ea RLE                      PT Short Term Goals - 08/23/20 1736       PT SHORT TERM GOAL #1   Title Patient will be I with initial HEP to progress with PT    Baseline initial HEP given    Period Weeks    Status New    Target Date 09/13/20      PT SHORT TERM GOAL #2   Title Pt will decrease 5xSTS to no greater than 12 sec with no inc in pain for improve balance and functional ability    Baseline 18 sec    Time 3    Period Weeks    Status New    Target Date 09/13/20               PT Long Term Goals - 08/23/20 1738       PT LONG TERM GOAL #1   Title Patient will be I with final HEP to maintain progress from PT    Time 8    Period Weeks    Status New    Target Date 10/18/20      PT LONG TERM GOAL #2   Title Pt will improve R ankle DF to no less than 10 deg in order to improve functional mobility    Baseline see flowsheet    Time 8    Period Weeks    Status New    Target Date 10/18/20      PT LONG TERM GOAL #3   Title Pt will improve TUG to no greater than 15 sec with LRAD in order to improve safety and mobility    Baseline 25 sec w/ standard walker    Time 8    Period Weeks    Status New  Target Date 10/18/20      PT LONG TERM GOAL #4   Title Pt will self report R LE pain no greater than 3/10 at worst for improved comfort and function    Baseline 10/10 at worst    Time 8    Period Weeks    Status New    Target Date 10/18/20                    Plan - 08/23/20 1732     Clinical Impression Statement Pt is a pleasant 55 y/o M who presents to PT s/p R dislocation and R tibial fx in late Feburary 2022.  Physical findings are consistent with sx and recovery timeline, as pt has limitations in strength, ROM, gait, and functional mobility related to deficits on R LE. His tug and 5xSTS times indicate an increased risk for falls and show he is operating below his baseline PLOF. He would benefit from skilled PT services working to improve his mobility and decrease pain in order to get back to PLOF and yard work.    Personal Factors and Comorbidities Comorbidity 2    Comorbidities previous surgeries; HTN; PTSD    Examination-Activity Limitations Stand;Transfers;Stairs;Lift;Sit;Locomotion Level;Carry;Squat    Examination-Participation Restrictions Yard Work;Volunteer;Community Activity;Meal Prep;Driving;Cleaning    Stability/Clinical Decision Making Stable/Uncomplicated    Clinical Decision Making Low    Rehab Potential Good    PT Frequency 2x / week    PT Duration 8 weeks    PT Treatment/Interventions ADLs/Self Care Home Management;Electrical Stimulation;Moist Heat;Cryotherapy;DME Instruction;Gait training;Stair training;Functional mobility training;Therapeutic activities;Therapeutic exercise;Balance training;Neuromuscular re-education;Patient/family education;Manual techniques;Vasopneumatic Device    PT Next Visit Plan assess response to HEP; assess and assist with gait; progress as able    PT Home Exercise Plan Access Code: [email or web address]    Consulted and Agree with Plan of Care Patient;Family member/caregiver    Family Member Consulted spouse(wife)             Patient will benefit from skilled therapeutic intervention in order to improve the following deficits and impairments:  Abnormal gait, Decreased activity tolerance, Decreased balance, Decreased coordination, Decreased endurance, Decreased range of motion, Decreased strength, Difficulty walking, Impaired flexibility, Pain  Visit Diagnosis: Muscle weakness (generalized) - Plan: PT plan of care cert/re-cert  Pain in right lower leg - Plan: PT  plan of care cert/re-cert  Other abnormalities of gait and mobility - Plan: PT plan of care cert/re-cert     Problem List Patient Active Problem List   Diagnosis Date Noted   Hospice care patient 09/12/2019   Spinal stenosis of lumbar region 09/11/2019   Eloy End, PT, DPT 08/23/20 5:43 PM   [facility] Out[facility] [address] [place], [place], [address] Phone: [phone]   Fax:  [phone]  Name: Manuel Pearson MRN: [id number] Date of Birth: [birth year]

## 2020-08-26 ENCOUNTER — Ambulatory Visit: Payer: No Typology Code available for payment source | Admitting: Physical Therapy

## 2020-08-26 ENCOUNTER — Encounter: Payer: Self-pay | Admitting: Physical Therapy

## 2020-08-26 ENCOUNTER — Other Ambulatory Visit: Payer: Self-pay

## 2020-08-26 DIAGNOSIS — R2689 Other abnormalities of gait and mobility: Secondary | ICD-10-CM

## 2020-08-26 DIAGNOSIS — M79661 Pain in right lower leg: Secondary | ICD-10-CM

## 2020-08-26 DIAGNOSIS — M6281 Muscle weakness (generalized): Secondary | ICD-10-CM | POA: Diagnosis not present

## 2020-08-26 NOTE — Patient Instructions (Signed)
Access Code: [email or web address] URL: [email or web address] Date: 08/26/2020 Prepared by: Rosana Hoes  Exercises Long Sitting Calf Stretch with Strap - 3 x daily - 7 x weekly - 1 sets - 3 reps - 30-60 sec hold Supine Ankle Pumps - 8 x daily - 7 x weekly - 1 sets - 40 reps Supine Ankle Circles - 8 x daily - 7 x weekly - 1 sets - 20 reps Ankle Dorsiflexion with Resistance - 1-2 x daily - 7 x weekly - 2 sets - 20 reps Long Sitting Ankle Plantar Flexion with Resistance - 1-2 x daily - 7 x weekly - 2 sets - 20 reps Ankle Inversion with Resistance - 1-2 x daily - 7 x weekly - 2 sets - 20 reps Long Sitting Ankle Eversion with Resistance - 1-2 x daily - 7 x weekly - 2 sets - 20 reps

## 2020-08-26 NOTE — Therapy (Signed)
[facility] Outpatient Rehabilitation [facility] [address] [place], [place], [address] Phone: [phone]   Fax:  [phone]  Physical Therapy Treatment  Patient Details  Name: Manuel Pearson MRN: [id number] Date of Birth: 15-Jul-1965 Referring Provider (PT): Dannielle Huh, MD   Encounter Date: 08/26/2020   PT End of Session - 08/26/20 1119     Visit Number 2    Number of Visits 15    Date for PT Re-Evaluation 10/30/20    Authorization Type [facility] Authorized Visits    Authorization - Visit Number 2    Authorization - Number of Visits 15    PT Start Time 1045    PT Stop Time 1130    PT Time Calculation (min) 45 min    Activity Tolerance Patient tolerated treatment well    Behavior During Therapy WFL for tasks assessed/performed             Past Medical History:  Diagnosis Date   Anxiety    Elevated liver enzymes    has had increases due to headache meds, but back to normal    GERD (gastroesophageal reflux disease)    Headache(784.0)    MIGRAINES    Hypertension    Hypothyroidism    PONV (postoperative nausea and vomiting)    DIFFICULTY WAKING UP    PTSD (post-traumatic stress disorder)    Sleep apnea 2019    Past Surgical History:  Procedure Laterality Date   ANTERIOR CERVICAL DECOMP/DISCECTOMY FUSION  03/29/2011   Procedure: ANTERIOR CERVICAL DECOMPRESSION/DISCECTOMY FUSION 2 LEVELS;  Surgeon: Reinaldo Meeker, MD;  Location: [facility];  Service: Neurosurgery;  Laterality: N/A;  CERVICAL FOUR-FIVE, CERVICAL SIX-SEVEN  ANTERIOR CERVICAL DECOMPRESSION FUSION  WITH TRABECULAR METAL  AND PLATE   APPENDECTOMY  03/2017   DISTAL BICEPS TENDON REPAIR  2008   LEFT    LUMBAR LAMINECTOMY/DECOMPRESSION MICRODISCECTOMY Right 09/11/2019   Procedure: Laminectomy and Foraminotomy - Lumbar Two-Three - right;  Surgeon: Donalee Citrin, MD;  Location: [facility] OR;  Service: Neurosurgery;  Laterality: Right;  right   NECK SURGERY  1995   FUSION    SHOULDER SURGERY  2000    RIGHT     LEFT  2004     There were no vitals filed for this visit.   Subjective Assessment - 08/26/20 1115     Subjective Patient reports exercises going well at home. He did not his dog hit his foot and caused a little increase in swelling.    Patient Stated Goals Pt would like to improve mobility and decrease pain in order to get back to yard work    Currently in Pain? Yes    Pain Score 4     Pain Location Leg    Pain Orientation Right;Lower    Pain Descriptors / Indicators Aching    Pain Type Chronic pain;Surgical pain    Pain Onset More than a month ago    Pain Frequency Constant                [facility] PT Assessment - 08/26/20 0001       Observation/Other Assessments   Focus on Therapeutic Outcomes (FOTO)  34% functional status   predicted 54%     AROM   Right Ankle Dorsiflexion 6    Right Ankle Plantar Flexion 40    Right Ankle Inversion 35    Right Ankle Eversion 20      Strength   Right Ankle Dorsiflexion 4/5    Right Ankle Plantar  Flexion 3-/5    Right Ankle Inversion 4/5    Right Ankle Eversion 4-/5                           [facility] Adult PT Treatment/Exercise - 08/26/20 0001       Manual Therapy   Manual Therapy Edema management;Joint mobilization;Passive ROM;Soft tissue mobilization    Edema Management Flush massage while ankle elevated    Joint Mobilization Talocrurual AP/PA mobs to improve DF and PF    Soft tissue mobilization Right calf    Passive ROM Ankle DF PROM      Ankle Exercises: Supine   T-Band 4-way ankle with green 2 x 20 each      Ankle Exercises: Stretches   Gastroc Stretch 3 reps;30 seconds    Gastroc Stretch Limitations supine PROM by PT      Ankle Exercises: Seated   BAPS Sitting;Level 3;15 reps   2 sets, fwd/bwd lateral cw/ccw                   PT Education - 08/26/20 1118     Education Details HEP update    Person(s) Educated Patient    Methods Explanation;Demonstration;Tactile cues;Verbal  cues;Handout    Comprehension Verbalized understanding;Returned demonstration;Verbal cues required;Tactile cues required;Need further instruction              PT Short Term Goals - 08/23/20 1736       PT SHORT TERM GOAL #1   Title Patient will be I with initial HEP to progress with PT    Baseline initial HEP given    Period Weeks    Status New    Target Date 09/13/20      PT SHORT TERM GOAL #2   Title Pt will decrease 5xSTS to no greater than 12 sec with no inc in pain for improve balance and functional ability    Baseline 18 sec    Time 3    Period Weeks    Status New    Target Date 09/13/20               PT Long Term Goals - 08/23/20 1738       PT LONG TERM GOAL #1   Title Patient will be I with final HEP to maintain progress from PT    Time 8    Period Weeks    Status New    Target Date 10/18/20      PT LONG TERM GOAL #2   Title Pt will improve R ankle DF to no less than 10 deg in order to improve functional mobility    Baseline see flowsheet    Time 8    Period Weeks    Status New    Target Date 10/18/20      PT LONG TERM GOAL #3   Title Pt will improve TUG to no greater than 15 sec with LRAD in order to improve safety and mobility    Baseline 25 sec w/ standard walker    Time 8    Period Weeks    Status New    Target Date 10/18/20      PT LONG TERM GOAL #4   Title Pt will self report R LE pain no greater than 3/10 at worst for improved comfort and function    Baseline 10/10 at worst    Time 8    Period Weeks    Status New  Target Date 10/18/20                   Plan - 08/26/20 1119     Clinical Impression Statement Patient tolerated therapy well with no adverse effects. He exhibits improved ankle motion in all directions and able to tolerate progressions in ankle strengthening with good tolerance. He did exhibit ankle edema this visit so used manual therapy to reduce swelling and improve ankle mobility and motion. Patient would  benefit from continued skilled PT to progress mobility and strength to reduce pain and improve walking to maximize functional ability.    PT Treatment/Interventions ADLs/Self Care Home Management;Electrical Stimulation;Moist Heat;Cryotherapy;DME Instruction;Gait training;Stair training;Functional mobility training;Therapeutic activities;Therapeutic exercise;Balance training;Neuromuscular re-education;Patient/family education;Manual techniques;Vasopneumatic Device    PT Next Visit Plan Review HEP and progress PRN, manual and stretching for ankle motion, progress ankle strengthening, initiate weight shifting and gait without boot as tolerated    PT Home Exercise Plan Access Code: [email or web address]    Consulted and Agree with Plan of Care Patient             Patient will benefit from skilled therapeutic intervention in order to improve the following deficits and impairments:  Abnormal gait, Decreased activity tolerance, Decreased balance, Decreased coordination, Decreased endurance, Decreased range of motion, Decreased strength, Difficulty walking, Impaired flexibility, Pain  Visit Diagnosis: Pain in right lower leg  Muscle weakness (generalized)  Other abnormalities of gait and mobility     Problem List Patient Active Problem List   Diagnosis Date Noted   Hospice care patient 09/12/2019   Spinal stenosis of lumbar region 09/11/2019    Rosana Hoes, PT, DPT, LAT, ATC 08/26/20  11:36 AM Phone: [phone] Fax: [phone]   [facility] Outpatient Rehabilitation [facility] [address] [place], [place], [address] Phone: [phone]   Fax:  [phone]  Name: Manuel Pearson MRN: [id number] Date of Birth: [birth year]

## 2020-08-28 ENCOUNTER — Other Ambulatory Visit: Payer: Self-pay

## 2020-08-28 ENCOUNTER — Encounter: Payer: Self-pay | Admitting: Physical Therapy

## 2020-08-28 ENCOUNTER — Ambulatory Visit: Payer: No Typology Code available for payment source | Admitting: Physical Therapy

## 2020-08-28 DIAGNOSIS — M6281 Muscle weakness (generalized): Secondary | ICD-10-CM | POA: Diagnosis not present

## 2020-08-28 DIAGNOSIS — M79661 Pain in right lower leg: Secondary | ICD-10-CM

## 2020-08-28 DIAGNOSIS — R2689 Other abnormalities of gait and mobility: Secondary | ICD-10-CM

## 2020-08-28 NOTE — Therapy (Signed)
[facility] Outpatient Rehabilitation [facility] [address] [place], [place], [address] Phone: [phone]   Fax:  [phone]  Physical Therapy Treatment  Patient Details  Name: Manuel Pearson MRN: [id number] Date of Birth: [birth year] Referring Provider (PT): Dannielle Huh, MD   Encounter Date: 08/28/2020   PT End of Session - 08/28/20 1044     Visit Number 3    Number of Visits 15    Date for PT Re-Evaluation 10/30/20    Authorization Type [facility] Authorized Visits    Authorization - Visit Number 3    Authorization - Number of Visits 15    PT Start Time 1045    PT Stop Time 1130    PT Time Calculation (min) 45 min    Activity Tolerance Patient tolerated treatment well    Behavior During Therapy WFL for tasks assessed/performed             Past Medical History:  Diagnosis Date   Anxiety    Elevated liver enzymes    has had increases due to headache meds, but back to normal    GERD (gastroesophageal reflux disease)    Headache(784.0)    MIGRAINES    Hypertension    Hypothyroidism    PONV (postoperative nausea and vomiting)    DIFFICULTY WAKING UP    PTSD (post-traumatic stress disorder)    Sleep apnea 2019    Past Surgical History:  Procedure Laterality Date   ANTERIOR CERVICAL DECOMP/DISCECTOMY FUSION  03/29/2011   Procedure: ANTERIOR CERVICAL DECOMPRESSION/DISCECTOMY FUSION 2 LEVELS;  Surgeon: Reinaldo Meeker, MD;  Location: [facility];  Service: Neurosurgery;  Laterality: N/A;  CERVICAL FOUR-FIVE, CERVICAL SIX-SEVEN  ANTERIOR CERVICAL DECOMPRESSION FUSION  WITH TRABECULAR METAL  AND PLATE   APPENDECTOMY  03/2017   DISTAL BICEPS TENDON REPAIR  2008   LEFT    LUMBAR LAMINECTOMY/DECOMPRESSION MICRODISCECTOMY Right 09/11/2019   Procedure: Laminectomy and Foraminotomy - Lumbar Two-Three - right;  Surgeon: Donalee Citrin, MD;  Location: [facility] OR;  Service: Neurosurgery;  Laterality: Right;  right   NECK SURGERY  1995   FUSION    SHOULDER SURGERY  2000    RIGHT     LEFT  2004     There were no vitals filed for this visit.   Subjective Assessment - 08/28/20 1043     Subjective Patient reports ankle is sore and swollen. Exercises are ok, he did one set with the green band.    Patient Stated Goals Pt would like to improve mobility and decrease pain in order to get back to yard work    Currently in Pain? Yes    Pain Score 6     Pain Location Leg    Pain Orientation Right;Lower    Pain Descriptors / Indicators Aching;Sore    Pain Type Chronic pain;Surgical pain    Pain Onset More than a month ago    Pain Frequency Constant                [facility] PT Assessment - 08/28/20 0001       AROM   Right Ankle Dorsiflexion 6                           [facility] Adult PT Treatment/Exercise - 08/28/20 0001       Ambulation/Gait   Ambulation/Gait Yes    Ambulation/Gait Assistance 6: Modified independent (Device/Increase time)    Assistive device Standard walker    Gait  Comments Patient initially with step-to pattern, able to transition to step-through pattern with cueing, continued antalgic gait on right      Exercises   Exercises Ankle      Manual Therapy   Manual Therapy Joint mobilization;Passive ROM    Joint Mobilization Talocrurual AP/PA mobs to improve DF and PF    Passive ROM Ankle DF PROM      Ankle Exercises: Stretches   Gastroc Stretch 3 reps;30 seconds    Gastroc Stretch Limitations supine PROM by PT with distraction      Ankle Exercises: Aerobic   Nustep L6 x 5 min with LE only, focus on ankle motion      Ankle Exercises: Seated   Heel Raises 20 reps    Toe Raise 20 reps    BAPS Sitting;Level 3;15 reps   2 sets     Ankle Exercises: Supine   T-Band 4-way ankle with green 2 x 10 each                    PT Education - 08/28/20 1043     Education Details HEP, progressing walking with shoe at home, using compression and elevation for swelling control    Person(s) Educated Patient    Methods  Explanation;Demonstration;Verbal cues    Comprehension Verbalized understanding;Returned demonstration;Tactile cues required              PT Short Term Goals - 08/23/20 1736       PT SHORT TERM GOAL #1   Title Patient will be I with initial HEP to progress with PT    Baseline initial HEP given    Period Weeks    Status New    Target Date 09/13/20      PT SHORT TERM GOAL #2   Title Pt will decrease 5xSTS to no greater than 12 sec with no inc in pain for improve balance and functional ability    Baseline 18 sec    Time 3    Period Weeks    Status New    Target Date 09/13/20               PT Long Term Goals - 08/23/20 1738       PT LONG TERM GOAL #1   Title Patient will be I with final HEP to maintain progress from PT    Time 8    Period Weeks    Status New    Target Date 10/18/20      PT LONG TERM GOAL #2   Title Pt will improve R ankle DF to no less than 10 deg in order to improve functional mobility    Baseline see flowsheet    Time 8    Period Weeks    Status New    Target Date 10/18/20      PT LONG TERM GOAL #3   Title Pt will improve TUG to no greater than 15 sec with LRAD in order to improve safety and mobility    Baseline 25 sec w/ standard walker    Time 8    Period Weeks    Status New    Target Date 10/18/20      PT LONG TERM GOAL #4   Title Pt will self report R LE pain no greater than 3/10 at worst for improved comfort and function    Baseline 10/10 at worst    Time 8    Period Weeks    Status New  Target Date 10/18/20                   Plan - 08/28/20 1044     Clinical Impression Statement Patient tolerated therapy well with no adverse effects. Progressed patient into normal shoe with weight shifts and walking using walker. He did require cueing for proper step-through and heel-toe progression using walker. Continue with manual and stretching to improve ankle motion, and banded ankle strengthening. Patient instructed to  ambulate using walker around home in standard shoe 50% of the time and remain in the boot the other 50% and whenever outside the home. Patient would benefit from continued skilled PT to progress mobility and strength to reduce pain and improve walking to maximize functional ability.    PT Treatment/Interventions ADLs/Self Care Home Management;Electrical Stimulation;Moist Heat;Cryotherapy;DME Instruction;Gait training;Stair training;Functional mobility training;Therapeutic activities;Therapeutic exercise;Balance training;Neuromuscular re-education;Patient/family education;Manual techniques;Vasopneumatic Device    PT Next Visit Plan Review HEP and progress PRN, manual and stretching for ankle motion, progress ankle strengthening, initiate weight shifting and gait without boot as tolerated    PT Home Exercise Plan Access Code: [email or web address]    Consulted and Agree with Plan of Care Patient             Patient will benefit from skilled therapeutic intervention in order to improve the following deficits and impairments:  Abnormal gait, Decreased activity tolerance, Decreased balance, Decreased coordination, Decreased endurance, Decreased range of motion, Decreased strength, Difficulty walking, Impaired flexibility, Pain  Visit Diagnosis: Pain in right lower leg  Muscle weakness (generalized)  Other abnormalities of gait and mobility     Problem List Patient Active Problem List   Diagnosis Date Noted   Hospice care patient 09/12/2019   Spinal stenosis of lumbar region 09/11/2019    Rosana Hoes, PT, DPT, LAT, ATC 08/28/20  11:30 AM Phone: [phone] Fax: [phone]   [facility] Outpatient Rehabilitation [facility] [address] [place], [place], [address] Phone: [phone]   Fax:  [phone]  Name: Manuel Pearson MRN: [id number] Date of Birth: Jan 07, 1966

## 2020-08-31 ENCOUNTER — Encounter: Payer: Self-pay | Admitting: Physical Therapy

## 2020-08-31 ENCOUNTER — Ambulatory Visit: Payer: No Typology Code available for payment source | Admitting: Physical Therapy

## 2020-08-31 ENCOUNTER — Other Ambulatory Visit: Payer: Self-pay

## 2020-08-31 DIAGNOSIS — M6281 Muscle weakness (generalized): Secondary | ICD-10-CM

## 2020-08-31 DIAGNOSIS — M79661 Pain in right lower leg: Secondary | ICD-10-CM

## 2020-08-31 DIAGNOSIS — R2689 Other abnormalities of gait and mobility: Secondary | ICD-10-CM

## 2020-08-31 NOTE — Therapy (Signed)
[facility] Outpatient Rehabilitation [facility] [address] [place], [place], [address] Phone: [phone]   Fax:  [phone]  Physical Therapy Treatment  Patient Details  Name: Manuel Pearson MRN: [id number] Date of Birth: [birth year] Referring Provider (PT): Dannielle Huh, MD   Encounter Date: 08/31/2020   PT End of Session - 08/31/20 1057     Visit Number 4    Number of Visits 15    Date for PT Re-Evaluation 10/30/20    Authorization Type [facility] Authorized Visits    Authorization - Visit Number 4    Authorization - Number of Visits 15    PT Start Time 1046    PT Stop Time 1130    PT Time Calculation (min) 44 min    Activity Tolerance Patient tolerated treatment well    Behavior During Therapy WFL for tasks assessed/performed             Past Medical History:  Diagnosis Date   Anxiety    Elevated liver enzymes    has had increases due to headache meds, but back to normal    GERD (gastroesophageal reflux disease)    Headache(784.0)    MIGRAINES    Hypertension    Hypothyroidism    PONV (postoperative nausea and vomiting)    DIFFICULTY WAKING UP    PTSD (post-traumatic stress disorder)    Sleep apnea 2019    Past Surgical History:  Procedure Laterality Date   ANTERIOR CERVICAL DECOMP/DISCECTOMY FUSION  03/29/2011   Procedure: ANTERIOR CERVICAL DECOMPRESSION/DISCECTOMY FUSION 2 LEVELS;  Surgeon: Reinaldo Meeker, MD;  Location: [facility];  Service: Neurosurgery;  Laterality: N/A;  CERVICAL FOUR-FIVE, CERVICAL SIX-SEVEN  ANTERIOR CERVICAL DECOMPRESSION FUSION  WITH TRABECULAR METAL  AND PLATE   APPENDECTOMY  03/2017   DISTAL BICEPS TENDON REPAIR  2008   LEFT    LUMBAR LAMINECTOMY/DECOMPRESSION MICRODISCECTOMY Right 09/11/2019   Procedure: Laminectomy and Foraminotomy - Lumbar Two-Three - right;  Surgeon: Donalee Citrin, MD;  Location: [facility] OR;  Service: Neurosurgery;  Laterality: Right;  right   NECK SURGERY  1995   FUSION    SHOULDER SURGERY  2000    RIGHT     LEFT  2004     There were no vitals filed for this visit.   Subjective Assessment - 08/31/20 1052     Subjective Patient reports ankle is sore and swollen from being on it more and he has been walking with his shoe around the house.    Patient Stated Goals Pt would like to improve mobility and decrease pain in order to get back to yard work    Currently in Pain? Yes    Pain Score 5     Pain Location Leg    Pain Orientation Right;Lower    Pain Descriptors / Indicators Aching;Sore    Pain Type Chronic pain;Surgical pain    Pain Onset More than a month ago    Pain Frequency Constant                [facility] PT Assessment - 08/31/20 0001       Ambulation/Gait   Ambulation/Gait Yes    Ambulation/Gait Assistance 6: Modified independent (Device/Increase time)    Assistive device Straight cane    Gait Comments Patient exhibits toe out on right                           [facility] Adult PT Treatment/Exercise - 08/31/20 0001  Exercises   Exercises Ankle      Ankle Exercises: Stretches   Slant Board Stretch 3 reps;30 seconds      Ankle Exercises: Aerobic   Nustep L5 x 5 min with LE only, focus on ankle motion      Ankle Exercises: Standing   Other Standing Ankle Exercises Gait training with SPC in left hand; cueing for technique, heel-toe progression, step-through pattern      Ankle Exercises: Seated   Towel Inversion/Eversion Weights (lbs) 3    Towel Inversion/Eversion Limitations 2 x 10 each    Heel Raises 20 reps   2 sets   Toe Raise 20 reps   2 sets   BAPS Sitting;Level 3;15 reps   2 sets, fwd/bwd lateral cw/ccw     Ankle Exercises: Supine   T-Band 4-way ankle with green 2 x 20 each                    PT Education - 08/31/20 1056     Education Details HEP, proper cane fitting    Person(s) Educated Patient    Methods Explanation;Demonstration;Verbal cues    Comprehension Verbalized understanding;Returned demonstration;Verbal  cues required;Need further instruction              PT Short Term Goals - 08/23/20 1736       PT SHORT TERM GOAL #1   Title Patient will be I with initial HEP to progress with PT    Baseline initial HEP given    Period Weeks    Status New    Target Date 09/13/20      PT SHORT TERM GOAL #2   Title Pt will decrease 5xSTS to no greater than 12 sec with no inc in pain for improve balance and functional ability    Baseline 18 sec    Time 3    Period Weeks    Status New    Target Date 09/13/20               PT Long Term Goals - 08/23/20 1738       PT LONG TERM GOAL #1   Title Patient will be I with final HEP to maintain progress from PT    Time 8    Period Weeks    Status New    Target Date 10/18/20      PT LONG TERM GOAL #2   Title Pt will improve R ankle DF to no less than 10 deg in order to improve functional mobility    Baseline see flowsheet    Time 8    Period Weeks    Status New    Target Date 10/18/20      PT LONG TERM GOAL #3   Title Pt will improve TUG to no greater than 15 sec with LRAD in order to improve safety and mobility    Baseline 25 sec w/ standard walker    Time 8    Period Weeks    Status New    Target Date 10/18/20      PT LONG TERM GOAL #4   Title Pt will self report R LE pain no greater than 3/10 at worst for improved comfort and function    Baseline 10/10 at worst    Time 8    Period Weeks    Status New    Target Date 10/18/20  Plan - 08/31/20 1057     Clinical Impression Statement Patient tolerated therapy well with no adverse effects. He was able to progress to using cane in regular shoe this visit, he did require cueing for proper technique and step-through pattern. He was instructed he could use cane with shoe around house but continue using boot in community. He is tolerating exercises well and encouraged to continue swelling mangaement at home. Patient would benefit from continued skilled PT to  progress mobility and strength to reduce pain and improve walking to maximize functional ability.    PT Treatment/Interventions ADLs/Self Care Home Management;Electrical Stimulation;Moist Heat;Cryotherapy;DME Instruction;Gait training;Stair training;Functional mobility training;Therapeutic activities;Therapeutic exercise;Balance training;Neuromuscular re-education;Patient/family education;Manual techniques;Vasopneumatic Device    PT Next Visit Plan Review HEP and progress PRN, manual and stretching for ankle motion, progress ankle strengthening, initiate weight shifting and gait without boot as tolerated    PT Home Exercise Plan Access Code: [email or web address]    Consulted and Agree with Plan of Care Patient             Patient will benefit from skilled therapeutic intervention in order to improve the following deficits and impairments:  Abnormal gait, Decreased activity tolerance, Decreased balance, Decreased coordination, Decreased endurance, Decreased range of motion, Decreased strength, Difficulty walking, Impaired flexibility, Pain  Visit Diagnosis: Pain in right lower leg  Muscle weakness (generalized)  Other abnormalities of gait and mobility     Problem List Patient Active Problem List   Diagnosis Date Noted   Hospice care patient 09/12/2019   Spinal stenosis of lumbar region 09/11/2019    Rosana Hoes, PT, DPT, LAT, ATC 08/31/20  11:47 AM Phone: [phone] Fax: [phone]   [facility] Outpatient Rehabilitation [facility] [address] [place], [place], [address] Phone: [phone]   Fax:  [phone]  Name: GARDINER ESPANA MRN: [id number] Date of Birth: 1965-04-28

## 2020-08-31 NOTE — Patient Instructions (Signed)
Access Code: [email or web address] URL: [email or web address] Date: 08/26/2020 Prepared by: Rosana Hoes  Exercises Long Sitting Calf Stretch with Strap - 3 x daily - 7 x weekly - 1 sets - 3 reps - 30-60 sec hold Supine Ankle Pumps - 8 x daily - 7 x weekly - 1 sets - 40 reps Supine Ankle Circles - 8 x daily - 7 x weekly - 1 sets - 20 reps Ankle Dorsiflexion with Resistance - 1-2 x daily - 7 x weekly - 2 sets - 20 reps Long Sitting Ankle Plantar Flexion with Resistance - 1-2 x daily - 7 x weekly - 2 sets - 20 reps Ankle Inversion with Resistance - 1-2 x daily - 7 x weekly - 2 sets - 20 reps Long Sitting Ankle Eversion with Resistance - 1-2 x daily - 7 x weekly - 2 sets - 20 reps Seated Heel Toe Raises - 1-2 x daily - 7 x weekly - 2 sets - 20 reps

## 2020-09-03 ENCOUNTER — Ambulatory Visit: Payer: No Typology Code available for payment source | Admitting: Physical Therapy

## 2020-09-03 ENCOUNTER — Other Ambulatory Visit: Payer: Self-pay

## 2020-09-03 ENCOUNTER — Encounter: Payer: Self-pay | Admitting: Physical Therapy

## 2020-09-03 DIAGNOSIS — M79661 Pain in right lower leg: Secondary | ICD-10-CM

## 2020-09-03 DIAGNOSIS — M6281 Muscle weakness (generalized): Secondary | ICD-10-CM

## 2020-09-03 DIAGNOSIS — R2689 Other abnormalities of gait and mobility: Secondary | ICD-10-CM

## 2020-09-03 NOTE — Therapy (Signed)
[facility] Outpatient Rehabilitation [facility] [address] [place], [place], [address] Phone: [phone]   Fax:  [phone]  Physical Therapy Treatment  Patient Details  Name: Manuel Pearson MRN: [id number] Date of Birth: 27-Feb-1966 Referring Provider (PT): Dannielle Huh, MD   Encounter Date: 09/03/2020   PT End of Session - 09/03/20 1055     Visit Number 5    Number of Visits 15    Date for PT Re-Evaluation 10/30/20    Authorization Type [facility] Authorized Visits    Authorization - Visit Number 5    Authorization - Number of Visits 15    PT Start Time 1044    PT Stop Time 1130    PT Time Calculation (min) 46 min    Activity Tolerance Patient tolerated treatment well    Behavior During Therapy WFL for tasks assessed/performed             Past Medical History:  Diagnosis Date   Anxiety    Elevated liver enzymes    has had increases due to headache meds, but back to normal    GERD (gastroesophageal reflux disease)    Headache(784.0)    MIGRAINES    Hypertension    Hypothyroidism    PONV (postoperative nausea and vomiting)    DIFFICULTY WAKING UP    PTSD (post-traumatic stress disorder)    Sleep apnea 2019    Past Surgical History:  Procedure Laterality Date   ANTERIOR CERVICAL DECOMP/DISCECTOMY FUSION  03/29/2011   Procedure: ANTERIOR CERVICAL DECOMPRESSION/DISCECTOMY FUSION 2 LEVELS;  Surgeon: Reinaldo Meeker, MD;  Location: [facility];  Service: Neurosurgery;  Laterality: N/A;  CERVICAL FOUR-FIVE, CERVICAL SIX-SEVEN  ANTERIOR CERVICAL DECOMPRESSION FUSION  WITH TRABECULAR METAL  AND PLATE   APPENDECTOMY  03/2017   DISTAL BICEPS TENDON REPAIR  2008   LEFT    LUMBAR LAMINECTOMY/DECOMPRESSION MICRODISCECTOMY Right 09/11/2019   Procedure: Laminectomy and Foraminotomy - Lumbar Two-Three - right;  Surgeon: Donalee Citrin, MD;  Location: [facility] OR;  Service: Neurosurgery;  Laterality: Right;  right   NECK SURGERY  1995   FUSION    SHOULDER SURGERY  2000    RIGHT     LEFT  2004     There were no vitals filed for this visit.   Subjective Assessment - 09/03/20 1050     Subjective Patient reports ankle is doing well, a little pain and was a little more swollen this morning. He did order a cane that should be in soon. He is not using the boot walking inside but does have to adjust his shoe laces toward the end of the day if his ankle gets swollen.    Patient Stated Goals Pt would like to improve mobility and decrease pain in order to get back to yard work    Currently in Pain? Yes    Pain Score 6     Pain Location Leg    Pain Orientation Right;Lower    Pain Descriptors / Indicators Aching;Sore    Pain Type Chronic pain;Surgical pain    Pain Onset More than a month ago    Pain Frequency Constant                [facility] PT Assessment - 09/03/20 0001       AROM   Right Ankle Dorsiflexion 6                           [facility] Adult PT  Treatment/Exercise - 09/03/20 0001       Neuro Re-ed    Neuro Re-ed Details  Tandem stance 3 x 30 sec   right foot back only     Exercises   Exercises Ankle      Manual Therapy   Manual Therapy Joint mobilization;Edema management    Edema Management Flush massage while ankle elevated with patient prone and knee flexed    Joint Mobilization Talocrurual AP/PA mobs with patient prone and knee flexed      Ankle Exercises: Supine   T-Band 4-way ankle with green 2 x 20 each    Other Supine Ankle Exercises Ankle AROM x 20 all directions with leg elevated      Ankle Exercises: Stretches   Slant Board Stretch 3 reps;30 seconds      Ankle Exercises: Aerobic   Recumbent Bike L2 x 5 min while taking subjective      Ankle Exercises: Standing   Heel Raises 10 reps   3 sets                   PT Education - 09/03/20 1055     Education Details HEP update    Person(s) Educated Patient    Methods Explanation;Demonstration;Verbal cues;Handout    Comprehension Verbalized  understanding;Returned demonstration;Verbal cues required;Need further instruction              PT Short Term Goals - 08/23/20 1736       PT SHORT TERM GOAL #1   Title Patient will be I with initial HEP to progress with PT    Baseline initial HEP given    Period Weeks    Status New    Target Date 09/13/20      PT SHORT TERM GOAL #2   Title Pt will decrease 5xSTS to no greater than 12 sec with no inc in pain for improve balance and functional ability    Baseline 18 sec    Time 3    Period Weeks    Status New    Target Date 09/13/20               PT Long Term Goals - 08/23/20 1738       PT LONG TERM GOAL #1   Title Patient will be I with final HEP to maintain progress from PT    Time 8    Period Weeks    Status New    Target Date 10/18/20      PT LONG TERM GOAL #2   Title Pt will improve R ankle DF to no less than 10 deg in order to improve functional mobility    Baseline see flowsheet    Time 8    Period Weeks    Status New    Target Date 10/18/20      PT LONG TERM GOAL #3   Title Pt will improve TUG to no greater than 15 sec with LRAD in order to improve safety and mobility    Baseline 25 sec w/ standard walker    Time 8    Period Weeks    Status New    Target Date 10/18/20      PT LONG TERM GOAL #4   Title Pt will self report R LE pain no greater than 3/10 at worst for improved comfort and function    Baseline 10/10 at worst    Time 8    Period Weeks    Status New  Target Date 10/18/20                   Plan - 09/03/20 1056     Clinical Impression Statement Patient tolerated therapy well with no adverse effects. Continued manual to reduce swelling and improve ankle mobility. Patient is progressing well with his stretching and stengthening exercises. Initiated standing heel raises and balance this visit on flat surface with good tolerance.  Patient would benefit from continued skilled PT to progress mobility and strength to reduce pain  and improve walking to maximize functional ability.    PT Treatment/Interventions ADLs/Self Care Home Management;Electrical Stimulation;Moist Heat;Cryotherapy;DME Instruction;Gait training;Stair training;Functional mobility training;Therapeutic activities;Therapeutic exercise;Balance training;Neuromuscular re-education;Patient/family education;Manual techniques;Vasopneumatic Device    PT Next Visit Plan Review HEP and progress PRN, manual and stretching for ankle motion, progress ankle strengthening, initiate weight shifting and gait without boot as tolerated    PT Home Exercise Plan Access Code: [email or web address]    Consulted and Agree with Plan of Care Patient             Patient will benefit from skilled therapeutic intervention in order to improve the following deficits and impairments:  Abnormal gait, Decreased activity tolerance, Decreased balance, Decreased coordination, Decreased endurance, Decreased range of motion, Decreased strength, Difficulty walking, Impaired flexibility, Pain  Visit Diagnosis: Pain in right lower leg  Muscle weakness (generalized)  Other abnormalities of gait and mobility     Problem List Patient Active Problem List   Diagnosis Date Noted   Hospice care patient 09/12/2019   Spinal stenosis of lumbar region 09/11/2019    Rosana Hoes, PT, DPT, LAT, ATC 09/03/20  11:40 AM Phone: [phone] Fax: [phone]   [facility] Outpatient Rehabilitation [facility] [address] [place], [place], [address] Phone: [phone]   Fax:  [phone]  Name: Manuel Pearson MRN: [id number] Date of Birth: Aug 12, 1965

## 2020-09-03 NOTE — Patient Instructions (Signed)
Access Code: [email or web address] URL: [email or web address] Date: 09/03/2020 Prepared by: Rosana Hoes  Exercises Long Sitting Calf Stretch with Strap - 3 x daily - 7 x weekly - 1 sets - 3 reps - 30-60 sec hold Supine Ankle Pumps - 8 x daily - 7 x weekly - 1 sets - 40 reps Supine Ankle Circles - 8 x daily - 7 x weekly - 1 sets - 20 reps Ankle Dorsiflexion with Resistance - 1-2 x daily - 7 x weekly - 2 sets - 20 reps Long Sitting Ankle Plantar Flexion with Resistance - 1-2 x daily - 7 x weekly - 2 sets - 20 reps Ankle Inversion with Resistance - 1-2 x daily - 7 x weekly - 2 sets - 20 reps Long Sitting Ankle Eversion with Resistance - 1-2 x daily - 7 x weekly - 2 sets - 20 reps Standing Heel Raise - 1 x daily - 7 x weekly - 3 sets - 10 reps Standing Tandem Balance with Counter Support - 1-2 x daily - 7 x weekly - 3 reps - 30 hold

## 2020-09-07 ENCOUNTER — Ambulatory Visit: Payer: No Typology Code available for payment source | Admitting: Physical Therapy

## 2020-09-07 ENCOUNTER — Encounter: Payer: Self-pay | Admitting: Physical Therapy

## 2020-09-07 ENCOUNTER — Other Ambulatory Visit: Payer: Self-pay

## 2020-09-07 DIAGNOSIS — M6281 Muscle weakness (generalized): Secondary | ICD-10-CM

## 2020-09-07 DIAGNOSIS — M79661 Pain in right lower leg: Secondary | ICD-10-CM

## 2020-09-07 DIAGNOSIS — R2689 Other abnormalities of gait and mobility: Secondary | ICD-10-CM

## 2020-09-07 NOTE — Therapy (Signed)
[facility] Outpatient Rehabilitation [facility] [address] [place], [place], [address] Phone: [phone]   Fax:  [phone]  Physical Therapy Treatment  Patient Details  Name: Manuel Pearson MRN: [id number] Date of Birth: [birth year] Referring Provider (PT): Dannielle Huh, MD   Encounter Date: 09/07/2020   PT End of Session - 09/07/20 1018     Visit Number 6    Number of Visits 15    Date for PT Re-Evaluation 10/30/20    Authorization Type [facility] Authorized Visits    Authorization - Visit Number 6    Authorization - Number of Visits 15    PT Start Time 1001    PT Stop Time 1045    PT Time Calculation (min) 44 min    Activity Tolerance Patient tolerated treatment well    Behavior During Therapy WFL for tasks assessed/performed             Past Medical History:  Diagnosis Date   Anxiety    Elevated liver enzymes    has had increases due to headache meds, but back to normal    GERD (gastroesophageal reflux disease)    Headache(784.0)    MIGRAINES    Hypertension    Hypothyroidism    PONV (postoperative nausea and vomiting)    DIFFICULTY WAKING UP    PTSD (post-traumatic stress disorder)    Sleep apnea 2019    Past Surgical History:  Procedure Laterality Date   ANTERIOR CERVICAL DECOMP/DISCECTOMY FUSION  03/29/2011   Procedure: ANTERIOR CERVICAL DECOMPRESSION/DISCECTOMY FUSION 2 LEVELS;  Surgeon: Reinaldo Meeker, MD;  Location: [facility];  Service: Neurosurgery;  Laterality: N/A;  CERVICAL FOUR-FIVE, CERVICAL SIX-SEVEN  ANTERIOR CERVICAL DECOMPRESSION FUSION  WITH TRABECULAR METAL  AND PLATE   APPENDECTOMY  03/2017   DISTAL BICEPS TENDON REPAIR  2008   LEFT    LUMBAR LAMINECTOMY/DECOMPRESSION MICRODISCECTOMY Right 09/11/2019   Procedure: Laminectomy and Foraminotomy - Lumbar Two-Three - right;  Surgeon: Donalee Citrin, MD;  Location: [facility] OR;  Service: Neurosurgery;  Laterality: Right;  right   NECK SURGERY  1995   FUSION    SHOULDER SURGERY  2000    RIGHT     LEFT  2004     There were no vitals filed for this visit.   Subjective Assessment - 09/07/20 1013     Subjective Patient reports he is doing well, he is now using a cane full time in and out of house. Continue to have discomfort and swelling but is using ACE wrap and elevation which helps.    Patient Stated Goals Pt would like to improve mobility and decrease pain in order to get back to yard work    Currently in Pain? Yes    Pain Score 5     Pain Location Leg    Pain Orientation Right;Lower    Pain Descriptors / Indicators Aching;Sore    Pain Type Chronic pain;Surgical pain    Pain Onset More than a month ago    Pain Frequency Constant    Aggravating Factors  Walking, standing                               [facility] Adult PT Treatment/Exercise - 09/07/20 0001       Neuro Re-ed    Neuro Re-ed Details  Tandem stance on Airex 3 x 30 sec each      Exercises   Exercises Ankle  Ankle Exercises: Stretches   Gastroc Stretch 3 reps;30 seconds    Gastroc Stretch Limitations standing at counter      Ankle Exercises: Aerobic   Nustep L5 x 5 min with LE only, focus on ankle motion      Ankle Exercises: Standing   Heel Raises 10 reps   3 sets   Other Standing Ankle Exercises Gait training with SPC in left hand; cueing to avoid excessive toe out and proper heel-toe progression to avoid excessive heel whip      Ankle Exercises: Seated   BAPS Sitting;Level 4;15 reps   2 sets, fwd/bwd lateral cw/ccw     Ankle Exercises: Supine   T-Band 4-way ankle with green 2 x 15 each                    PT Education - 09/07/20 1015     Education Details HEP update    Person(s) Educated Patient    Methods Explanation;Demonstration;Verbal cues;Handout    Comprehension Verbalized understanding;Returned demonstration;Tactile cues required;Need further instruction              PT Short Term Goals - 08/23/20 1736       PT SHORT TERM GOAL #1   Title  Patient will be I with initial HEP to progress with PT    Baseline initial HEP given    Period Weeks    Status New    Target Date 09/13/20      PT SHORT TERM GOAL #2   Title Pt will decrease 5xSTS to no greater than 12 sec with no inc in pain for improve balance and functional ability    Baseline 18 sec    Time 3    Period Weeks    Status New    Target Date 09/13/20               PT Long Term Goals - 08/23/20 1738       PT LONG TERM GOAL #1   Title Patient will be I with final HEP to maintain progress from PT    Time 8    Period Weeks    Status New    Target Date 10/18/20      PT LONG TERM GOAL #2   Title Pt will improve R ankle DF to no less than 10 deg in order to improve functional mobility    Baseline see flowsheet    Time 8    Period Weeks    Status New    Target Date 10/18/20      PT LONG TERM GOAL #3   Title Pt will improve TUG to no greater than 15 sec with LRAD in order to improve safety and mobility    Baseline 25 sec w/ standard walker    Time 8    Period Weeks    Status New    Target Date 10/18/20      PT LONG TERM GOAL #4   Title Pt will self report R LE pain no greater than 3/10 at worst for improved comfort and function    Baseline 10/10 at worst    Time 8    Period Weeks    Status New    Target Date 10/18/20                   Plan - 09/07/20 1020     Clinical Impression Statement Patient tolerated therapy well with no adverse effects. Continue focus on progression of  ankle stretching, strengthening, balance, and gait training. He was instructed to use SPC at all times moving forward and begin to wean out of the boot in community ambulation. Patient did exhibit excessice toe out and medial heel whip so cued and practive for improved foot position and heel-toe progression. Patient is progressing well with strengthening and balance exercises. He does continue to report increased discomfort with extended periods of weight bearing and  increased swelling more toward the end of the day. Patient would benefit from continued skilled PT to progress mobility and strength to reduce pain and improve walking to maximize functional ability.    PT Treatment/Interventions ADLs/Self Care Home Management;Electrical Stimulation;Moist Heat;Cryotherapy;DME Instruction;Gait training;Stair training;Functional mobility training;Therapeutic activities;Therapeutic exercise;Balance training;Neuromuscular re-education;Patient/family education;Manual techniques;Vasopneumatic Device    PT Next Visit Plan Review HEP and progress PRN, manual and stretching for ankle motion, progress ankle strengthening, initiate weight shifting and gait without boot as tolerated    PT Home Exercise Plan Access Code: [email or web address]    Consulted and Agree with Plan of Care Patient             Patient will benefit from skilled therapeutic intervention in order to improve the following deficits and impairments:  Abnormal gait, Decreased activity tolerance, Decreased balance, Decreased coordination, Decreased endurance, Decreased range of motion, Decreased strength, Difficulty walking, Impaired flexibility, Pain  Visit Diagnosis: Pain in right lower leg  Muscle weakness (generalized)  Other abnormalities of gait and mobility     Problem List Patient Active Problem List   Diagnosis Date Noted   Hospice care patient 09/12/2019   Spinal stenosis of lumbar region 09/11/2019    Rosana Hoes, PT, DPT, LAT, ATC 09/07/20  11:07 AM Phone: [phone] Fax: [phone]   [facility] Outpatient Rehabilitation [facility] [address] [place], [place], [address] Phone: [phone]   Fax:  [phone]  Name: FLETCHER OSTERMILLER MRN: [id number] Date of Birth: [birth year]

## 2020-09-07 NOTE — Patient Instructions (Signed)
Access Code: [email or web address] URL: [email or web address] Date: 09/07/2020 Prepared by: Rosana Hoes  Exercises Supine Ankle Pumps - 3-4 x daily - 7 x weekly - 1 sets - 40 reps Supine Ankle Circles - 3-4 x daily - 7 x weekly - 1 sets - 20 reps Ankle Dorsiflexion with Resistance - 1-2 x daily - 7 x weekly - 2 sets - 20 reps Long Sitting Ankle Plantar Flexion with Resistance - 1-2 x daily - 7 x weekly - 2 sets - 20 reps Ankle Inversion with Resistance - 1-2 x daily - 7 x weekly - 2 sets - 20 reps Long Sitting Ankle Eversion with Resistance - 1-2 x daily - 7 x weekly - 2 sets - 20 reps Standing Heel Raise - 1 x daily - 7 x weekly - 3 sets - 10 reps Standing Tandem Balance with Counter Support - 1-2 x daily - 7 x weekly - 3 reps - 30 hold Standing Gastroc Stretch at Counter - 1-2 x daily - 7 x weekly - 3 reps - 30 hold

## 2020-09-10 ENCOUNTER — Ambulatory Visit: Payer: No Typology Code available for payment source | Admitting: Physical Therapy

## 2020-09-10 ENCOUNTER — Other Ambulatory Visit: Payer: Self-pay

## 2020-09-10 ENCOUNTER — Encounter: Payer: Self-pay | Admitting: Physical Therapy

## 2020-09-10 DIAGNOSIS — R2689 Other abnormalities of gait and mobility: Secondary | ICD-10-CM

## 2020-09-10 DIAGNOSIS — M6281 Muscle weakness (generalized): Secondary | ICD-10-CM

## 2020-09-10 DIAGNOSIS — M79661 Pain in right lower leg: Secondary | ICD-10-CM

## 2020-09-10 NOTE — Therapy (Signed)
[facility] Outpatient Rehabilitation [facility] [address] [place], [place], [address] Phone: [phone]   Fax:  [phone]  Physical Therapy Treatment  Patient Details  Name: Manuel Pearson MRN: [id number] Date of Birth: 05-25-1965 Referring Provider (PT): Dannielle Huh, MD   Encounter Date: 09/10/2020   PT End of Session - 09/10/20 1059     Visit Number 7    Number of Visits 15    Date for PT Re-Evaluation 10/30/20    Authorization Type [facility] Authorized Visits    Authorization - Visit Number 7    Authorization - Number of Visits 15    PT Start Time 1045    PT Stop Time 1130    PT Time Calculation (min) 45 min    Activity Tolerance Patient tolerated treatment well    Behavior During Therapy WFL for tasks assessed/performed             Past Medical History:  Diagnosis Date   Anxiety    Elevated liver enzymes    has had increases due to headache meds, but back to normal    GERD (gastroesophageal reflux disease)    Headache(784.0)    MIGRAINES    Hypertension    Hypothyroidism    PONV (postoperative nausea and vomiting)    DIFFICULTY WAKING UP    PTSD (post-traumatic stress disorder)    Sleep apnea 2019    Past Surgical History:  Procedure Laterality Date   ANTERIOR CERVICAL DECOMP/DISCECTOMY FUSION  03/29/2011   Procedure: ANTERIOR CERVICAL DECOMPRESSION/DISCECTOMY FUSION 2 LEVELS;  Surgeon: Reinaldo Meeker, MD;  Location: [facility];  Service: Neurosurgery;  Laterality: N/A;  CERVICAL FOUR-FIVE, CERVICAL SIX-SEVEN  ANTERIOR CERVICAL DECOMPRESSION FUSION  WITH TRABECULAR METAL  AND PLATE   APPENDECTOMY  03/2017   DISTAL BICEPS TENDON REPAIR  2008   LEFT    LUMBAR LAMINECTOMY/DECOMPRESSION MICRODISCECTOMY Right 09/11/2019   Procedure: Laminectomy and Foraminotomy - Lumbar Two-Three - right;  Surgeon: Donalee Citrin, MD;  Location: [facility] OR;  Service: Neurosurgery;  Laterality: Right;  right   NECK SURGERY  1995   FUSION    SHOULDER SURGERY  2000    RIGHT     LEFT  2004     There were no vitals filed for this visit.   Subjective Assessment - 09/10/20 1055     Subjective Patient reports he is doing well, still swollen and sore. He has been walking more with the shoe and cane.    Patient Stated Goals Pt would like to improve mobility and decrease pain in order to get back to yard work    Currently in Pain? Yes    Pain Score 5     Pain Location Leg    Pain Orientation Right;Lower    Pain Descriptors / Indicators Aching;Sore    Pain Type Chronic pain    Pain Onset More than a month ago    Pain Frequency Constant                [facility] PT Assessment - 09/10/20 0001       Observation/Other Assessments   Focus on Therapeutic Outcomes (FOTO)  52% functional status      AROM   Right Ankle Dorsiflexion 7    Right Ankle Plantar Flexion 40      Ambulation/Gait   Gait Comments Patient with no toe off, foot remains in dorsiflexed position  [facility] Adult PT Treatment/Exercise - 09/10/20 0001       Neuro Re-ed    Neuro Re-ed Details  Tandem stance on Airex 3 x 30 sec each      Exercises   Exercises Ankle      Manual Therapy   Manual Therapy Joint mobilization;Passive ROM    Joint Mobilization Talocrural and subtalar mobs grade III-IV all directions    Passive ROM Ankle DF PROM      Ankle Exercises: Stretches   Gastroc Stretch 3 reps;30 seconds    Gastroc Stretch Limitations standing at counter      Ankle Exercises: Aerobic   Nustep L5 x 4 min with LE only, focus on ankle motion      Ankle Exercises: Standing   Rocker Board 1 minute   2 sets   Rocker Board Limitations fwd/bwd    Heel Raises 10 reps   2 sets   Other Standing Ankle Exercises Gait training focusing on proper toe off, weight shifts fwd/bwd in staggered stance      Ankle Exercises: Supine   T-Band 4-way ankle with green 2 x 15 each                    PT Education - 09/10/20 1059     Education  Details HEP    Person(s) Educated Patient    Methods Explanation;Demonstration;Verbal cues    Comprehension Verbalized understanding;Returned demonstration;Verbal cues required;Need further instruction              PT Short Term Goals - 09/10/20 1117       PT SHORT TERM GOAL #1   Title Patient will be I with initial HEP to progress with PT    Baseline patient I with initial HEP    Period Weeks    Status Achieved    Target Date 09/13/20      PT SHORT TERM GOAL #2   Title Pt will decrease 5xSTS to no greater than 12 sec with no inc in pain for improve balance and functional ability    Baseline 18 sec    Time 3    Period Weeks    Status New    Target Date 09/13/20               PT Long Term Goals - 08/23/20 1738       PT LONG TERM GOAL #1   Title Patient will be I with final HEP to maintain progress from PT    Time 8    Period Weeks    Status New    Target Date 10/18/20      PT LONG TERM GOAL #2   Title Pt will improve R ankle DF to no less than 10 deg in order to improve functional mobility    Baseline see flowsheet    Time 8    Period Weeks    Status New    Target Date 10/18/20      PT LONG TERM GOAL #3   Title Pt will improve TUG to no greater than 15 sec with LRAD in order to improve safety and mobility    Baseline 25 sec w/ standard walker    Time 8    Period Weeks    Status New    Target Date 10/18/20      PT LONG TERM GOAL #4   Title Pt will self report R LE pain no greater than 3/10 at worst for improved comfort and function  Baseline 10/10 at worst    Time 8    Period Weeks    Status New    Target Date 10/18/20                   Plan - 09/10/20 1104     Clinical Impression Statement Patient tolerated therapy well with no adverse effects. Therapy continue progression of ankle stretching, strengthening, and balance. Patient is progressing well and demonstrates continued improved motion and function on FOTO. Patient cued for toe  off with gait and able to improve mechanics. Patient would benefit from continued skilled PT to progress mobility and strength to reduce pain and improve walking to maximize functional ability.    PT Treatment/Interventions ADLs/Self Care Home Management;Electrical Stimulation;Moist Heat;Cryotherapy;DME Instruction;Gait training;Stair training;Functional mobility training;Therapeutic activities;Therapeutic exercise;Balance training;Neuromuscular re-education;Patient/family education;Manual techniques;Vasopneumatic Device    PT Next Visit Plan Review HEP and progress PRN, manual and stretching for ankle motion, progress ankle strengthening, initiate weight shifting and gait without boot as tolerated    PT Home Exercise Plan Access Code: [email or web address]    Consulted and Agree with Plan of Care Patient             Patient will benefit from skilled therapeutic intervention in order to improve the following deficits and impairments:  Abnormal gait, Decreased activity tolerance, Decreased balance, Decreased coordination, Decreased endurance, Decreased range of motion, Decreased strength, Difficulty walking, Impaired flexibility, Pain  Visit Diagnosis: Pain in right lower leg  Muscle weakness (generalized)  Other abnormalities of gait and mobility     Problem List Patient Active Problem List   Diagnosis Date Noted   Hospice care patient 09/12/2019   Spinal stenosis of lumbar region 09/11/2019    Rosana Hoes, PT, DPT, LAT, ATC 09/10/20  11:36 AM Phone: [phone] Fax: [phone]   [facility] Outpatient Rehabilitation [facility] [address] [place], [place], [address] Phone: [phone]   Fax:  [phone]  Name: Manuel Pearson MRN: [id number] Date of Birth: 1966-01-15

## 2020-09-16 ENCOUNTER — Encounter: Payer: Self-pay | Admitting: Physical Therapy

## 2020-09-16 ENCOUNTER — Other Ambulatory Visit: Payer: Self-pay

## 2020-09-16 ENCOUNTER — Ambulatory Visit: Payer: No Typology Code available for payment source | Attending: Orthopedic Surgery | Admitting: Physical Therapy

## 2020-09-16 DIAGNOSIS — G8929 Other chronic pain: Secondary | ICD-10-CM | POA: Insufficient documentation

## 2020-09-16 DIAGNOSIS — M545 Low back pain, unspecified: Secondary | ICD-10-CM | POA: Diagnosis present

## 2020-09-16 DIAGNOSIS — R2689 Other abnormalities of gait and mobility: Secondary | ICD-10-CM | POA: Diagnosis present

## 2020-09-16 DIAGNOSIS — M6281 Muscle weakness (generalized): Secondary | ICD-10-CM | POA: Diagnosis present

## 2020-09-16 DIAGNOSIS — M79661 Pain in right lower leg: Secondary | ICD-10-CM | POA: Diagnosis present

## 2020-09-16 NOTE — Therapy (Signed)
[facility] Outpatient Rehabilitation [facility] [facility] [address] [place], [place], [address] Phone: [phone]   Fax:  [phone]  Physical Therapy Treatment  Patient Details  Name: Manuel Pearson MRN: [id number] Date of Birth: 1965-11-05 Referring Provider (PT): Dannielle Huh, MD   Encounter Date: 09/16/2020   PT End of Session - 09/16/20 1144     Visit Number 8    Number of Visits 15    Date for PT Re-Evaluation 10/30/20    Authorization Type [facility] Authorized Visits    Authorization - Visit Number 8    Authorization - Number of Visits 15    PT Start Time 1135    PT Stop Time 1215    PT Time Calculation (min) 40 min    Activity Tolerance Patient tolerated treatment well    Behavior During Therapy WFL for tasks assessed/performed             Past Medical History:  Diagnosis Date   Anxiety    Elevated liver enzymes    has had increases due to headache meds, but back to normal    GERD (gastroesophageal reflux disease)    Headache(784.0)    MIGRAINES    Hypertension    Hypothyroidism    PONV (postoperative nausea and vomiting)    DIFFICULTY WAKING UP    PTSD (post-traumatic stress disorder)    Sleep apnea 2019    Past Surgical History:  Procedure Laterality Date   ANTERIOR CERVICAL DECOMP/DISCECTOMY FUSION  03/29/2011   Procedure: ANTERIOR CERVICAL DECOMPRESSION/DISCECTOMY FUSION 2 LEVELS;  Surgeon: Reinaldo Meeker, MD;  Location: [facility];  Service: Neurosurgery;  Laterality: N/A;  CERVICAL FOUR-FIVE, CERVICAL SIX-SEVEN  ANTERIOR CERVICAL DECOMPRESSION FUSION  WITH TRABECULAR METAL  AND PLATE   APPENDECTOMY  03/2017   DISTAL BICEPS TENDON REPAIR  2008   LEFT    LUMBAR LAMINECTOMY/DECOMPRESSION MICRODISCECTOMY Right 09/11/2019   Procedure: Laminectomy and Foraminotomy - Lumbar Two-Three - right;  Surgeon: Donalee Citrin, MD;  Location: [facility] OR;  Service: Neurosurgery;  Laterality: Right;  right   NECK SURGERY  1995   FUSION    SHOULDER SURGERY  2000    RIGHT     LEFT  2004     There were no vitals filed for this visit.   Subjective Assessment - 09/16/20 1142     Subjective Patient reports he was sore following last visit. He is still having some soreness and swelling today. He has continued to be walking more in a shoe with a cane.    Patient Stated Goals Pt would like to improve mobility and decrease pain in order to get back to yard work    Currently in Pain? Yes    Pain Score 5     Pain Location Leg    Pain Orientation Right;Lower    Pain Descriptors / Indicators Sore    Pain Type Chronic pain    Pain Onset More than a month ago    Pain Frequency Constant                [facility] PT Assessment - 09/16/20 0001       Transfers   Five time sit to stand comments  10 seconds                           [facility] Adult PT Treatment/Exercise - 09/16/20 0001       Exercises   Exercises Ankle  Ankle Exercises: Stretches   Slant Board Stretch 3 reps;30 seconds      Ankle Exercises: Aerobic   Nustep L5 x 4 min with LE only, focus on ankle motion      Ankle Exercises: Standing   Rocker Board 1 minute   2 sets   Rocker Board Limitations fwd/bwd    Heel Raises 15 reps    Other Standing Ankle Exercises Fwd heel tap on 2" step 2 x 8      Ankle Exercises: Supine   T-Band 4-way ankle with green 2 x 20 each                    PT Education - 09/16/20 1144     Education Details HEP, using ankle brace for unlevel surfaces in yard or community    Person(s) Educated Patient    Methods Explanation;Demonstration;Verbal cues    Comprehension Verbalized understanding;Returned demonstration;Verbal cues required;Need further instruction              PT Short Term Goals - 09/16/20 1158       PT SHORT TERM GOAL #1   Title Patient will be I with initial HEP to progress with PT    Baseline patient I with initial HEP    Period Weeks    Status Achieved    Target Date 09/13/20      PT SHORT TERM GOAL  #2   Title Pt will decrease 5xSTS to no greater than 12 sec with no inc in pain for improve balance and functional ability    Baseline 10 sec    Time 3    Period Weeks    Status Achieved    Target Date 09/13/20               PT Long Term Goals - 08/23/20 1738       PT LONG TERM GOAL #1   Title Patient will be I with final HEP to maintain progress from PT    Time 8    Period Weeks    Status New    Target Date 10/18/20      PT LONG TERM GOAL #2   Title Pt will improve R ankle DF to no less than 10 deg in order to improve functional mobility    Baseline see flowsheet    Time 8    Period Weeks    Status New    Target Date 10/18/20      PT LONG TERM GOAL #3   Title Pt will improve TUG to no greater than 15 sec with LRAD in order to improve safety and mobility    Baseline 25 sec w/ standard walker    Time 8    Period Weeks    Status New    Target Date 10/18/20      PT LONG TERM GOAL #4   Title Pt will self report R LE pain no greater than 3/10 at worst for improved comfort and function    Baseline 10/10 at worst    Time 8    Period Weeks    Status New    Target Date 10/18/20                   Plan - 09/16/20 1145     Clinical Impression Statement Patient tolerated therapy well with no adverse effects. He did report slightly increased soreness following last visit so reduced amount of standing exercises this visit. He does seem to  be improving overall and demonstrated much improved 5xSTS this visit. He was instructed in using an ankle brace with ambulation on unlevel surfaces in community but being in shoes full-time. Patient would benefit from continued skilled PT to progress mobility and strength to reduce pain and improve walking to maximize functional ability.    PT Treatment/Interventions ADLs/Self Care Home Management;Electrical Stimulation;Moist Heat;Cryotherapy;DME Instruction;Gait training;Stair training;Functional mobility training;Therapeutic  activities;Therapeutic exercise;Balance training;Neuromuscular re-education;Patient/family education;Manual techniques;Vasopneumatic Device    PT Next Visit Plan Review HEP and progress PRN, manual and stretching for ankle motion, progress ankle strengthening, initiate weight shifting and gait without boot as tolerated    PT Home Exercise Plan [phone]    Consulted and Agree with Plan of Care Patient             Patient will benefit from skilled therapeutic intervention in order to improve the following deficits and impairments:  Abnormal gait, Decreased activity tolerance, Decreased balance, Decreased coordination, Decreased endurance, Decreased range of motion, Decreased strength, Difficulty walking, Impaired flexibility, Pain  Visit Diagnosis: Pain in right lower leg  Muscle weakness (generalized)  Other abnormalities of gait and mobility     Problem List Patient Active Problem List   Diagnosis Date Noted   Hospice care patient 09/12/2019   Spinal stenosis of lumbar region 09/11/2019    Rosana Hoes, PT, DPT, LAT, ATC 09/16/20  12:21 PM Phone: [phone] Fax: [phone]   [facility] Outpatient Rehabilitation [facility] [address] [place], [place], [address] Phone: [phone]   Fax:  [phone]  Name: Manuel Pearson MRN: [id number] Date of Birth: October 23, 1965

## 2020-09-17 ENCOUNTER — Encounter: Payer: Medicare Other | Admitting: Physical Therapy

## 2020-09-21 ENCOUNTER — Ambulatory Visit: Payer: No Typology Code available for payment source | Admitting: Physical Therapy

## 2020-09-21 ENCOUNTER — Encounter: Payer: Self-pay | Admitting: Physical Therapy

## 2020-09-21 ENCOUNTER — Other Ambulatory Visit: Payer: Self-pay

## 2020-09-21 DIAGNOSIS — M79661 Pain in right lower leg: Secondary | ICD-10-CM

## 2020-09-21 DIAGNOSIS — R2689 Other abnormalities of gait and mobility: Secondary | ICD-10-CM

## 2020-09-21 DIAGNOSIS — M6281 Muscle weakness (generalized): Secondary | ICD-10-CM

## 2020-09-21 NOTE — Patient Instructions (Signed)
Access Code: [email or web address] URL: [email or web address] Date: 09/21/2020 Prepared by: Rosana Hoes  Exercises Supine Ankle Pumps - 3-4 x daily - 7 x weekly - 1 sets - 40 reps Supine Ankle Circles - 3-4 x daily - 7 x weekly - 1 sets - 20 reps Ankle Dorsiflexion with Resistance - 1-2 x daily - 7 x weekly - 2 sets - 20 reps Long Sitting Ankle Plantar Flexion with Resistance - 1-2 x daily - 7 x weekly - 2 sets - 20 reps Ankle Inversion with Resistance - 1-2 x daily - 7 x weekly - 2 sets - 20 reps Long Sitting Ankle Eversion with Resistance - 1-2 x daily - 7 x weekly - 2 sets - 20 reps Seated Heel Raise - 1 x daily - 7 x weekly - 3 sets - 10 reps Standing Heel Raise - 1 x daily - 7 x weekly - 3 sets - 10 reps Standing Tandem Balance with Counter Support - 1-2 x daily - 7 x weekly - 3 reps - 30 hold Standing Gastroc Stretch at Counter - 1-2 x daily - 7 x weekly - 3 reps - 30 hold

## 2020-09-21 NOTE — Therapy (Signed)
[facility] Outpatient Rehabilitation [facility] [address] [place], [place], [address] Phone: [phone]   Fax:  [phone]  Physical Therapy Treatment  Patient Details  Name: Manuel Pearson MRN: [id number] Date of Birth: 11-20-1965 Referring Provider (PT): Dannielle Huh, MD   Encounter Date: 09/21/2020   PT End of Session - 09/21/20 1046     Visit Number 9    Number of Visits 15    Date for PT Re-Evaluation 10/30/20    Authorization Type VA Authorized Visits    Authorization - Visit Number 9    Authorization - Number of Visits 15    PT Start Time 1040    PT Stop Time 1120    PT Time Calculation (min) 40 min    Activity Tolerance Patient tolerated treatment well    Behavior During Therapy WFL for tasks assessed/performed             Past Medical History:  Diagnosis Date   Anxiety    Elevated liver enzymes    has had increases due to headache meds, but back to normal    GERD (gastroesophageal reflux disease)    Headache(784.0)    MIGRAINES    Hypertension    Hypothyroidism    PONV (postoperative nausea and vomiting)    DIFFICULTY WAKING UP    PTSD (post-traumatic stress disorder)    Sleep apnea 2019    Past Surgical History:  Procedure Laterality Date   ANTERIOR CERVICAL DECOMP/DISCECTOMY FUSION  03/29/2011   Procedure: ANTERIOR CERVICAL DECOMPRESSION/DISCECTOMY FUSION 2 LEVELS;  Surgeon: Reinaldo Meeker, MD;  Location: [facility];  Service: Neurosurgery;  Laterality: N/A;  CERVICAL FOUR-FIVE, CERVICAL SIX-SEVEN  ANTERIOR CERVICAL DECOMPRESSION FUSION  WITH TRABECULAR METAL  AND PLATE   APPENDECTOMY  03/2017   DISTAL BICEPS TENDON REPAIR  2008   LEFT    LUMBAR LAMINECTOMY/DECOMPRESSION MICRODISCECTOMY Right 09/11/2019   Procedure: Laminectomy and Foraminotomy - Lumbar Two-Three - right;  Surgeon: Donalee Citrin, MD;  Location: [facility] OR;  Service: Neurosurgery;  Laterality: Right;  right   NECK SURGERY  1995   FUSION    SHOULDER SURGERY  2000    RIGHT     LEFT  2004     There were no vitals filed for this visit.   Subjective Assessment - 09/21/20 1044     Subjective Patient reports he is doing better, states his ankle cracked this morning and that made it feel better. He is not longer using his boot.    Patient Stated Goals Pt would like to improve mobility and decrease pain in order to get back to yard work    Currently in Pain? Yes    Pain Score 4     Pain Location Leg    Pain Orientation Right;Lower    Pain Descriptors / Indicators Sore    Pain Type Chronic pain    Pain Onset More than a month ago    Pain Frequency Constant                [facility] PT Assessment - 09/21/20 0001       AROM   Right Ankle Dorsiflexion 8    Right Ankle Plantar Flexion 47    Right Ankle Inversion 45    Right Ankle Eversion 20                           [facility] Adult PT Treatment/Exercise - 09/21/20 0001  Neuro Re-ed    Neuro Re-ed Details  Tandem stance on Airex 3 x 30 sec each      Exercises   Exercises Ankle      Ankle Exercises: Stretches   Slant Board Stretch 3 reps;30 seconds      Ankle Exercises: Aerobic   Nustep L5 x 4 min with LE only, focus on ankle motion, while taking subjective      Ankle Exercises: Standing   Rocker Board 1 minute   4 sets   Rocker Board Limitations fwd/bwd taps x 2, lateral taps x 2    Heel Raises 15 reps   2 sets   Other Standing Ankle Exercises Fwd heel tap on 2" step 2 x 10      Ankle Exercises: Seated   Heel Raises 10 reps   2 sets   Heel Raises Limitations 15#                    PT Education - 09/21/20 1045     Education Details HEP update    Person(s) Educated Patient    Methods Explanation;Demonstration;Verbal cues    Comprehension Verbalized understanding;Returned demonstration;Verbal cues required;Need further instruction              PT Short Term Goals - 09/16/20 1158       PT SHORT TERM GOAL #1   Title Patient will be I with  initial HEP to progress with PT    Baseline patient I with initial HEP    Period Weeks    Status Achieved    Target Date 09/13/20      PT SHORT TERM GOAL #2   Title Pt will decrease 5xSTS to no greater than 12 sec with no inc in pain for improve balance and functional ability    Baseline 10 sec    Time 3    Period Weeks    Status Achieved    Target Date 09/13/20               PT Long Term Goals - 08/23/20 1738       PT LONG TERM GOAL #1   Title Patient will be I with final HEP to maintain progress from PT    Time 8    Period Weeks    Status New    Target Date 10/18/20      PT LONG TERM GOAL #2   Title Pt will improve R ankle DF to no less than 10 deg in order to improve functional mobility    Baseline see flowsheet    Time 8    Period Weeks    Status New    Target Date 10/18/20      PT LONG TERM GOAL #3   Title Pt will improve TUG to no greater than 15 sec with LRAD in order to improve safety and mobility    Baseline 25 sec w/ standard walker    Time 8    Period Weeks    Status New    Target Date 10/18/20      PT LONG TERM GOAL #4   Title Pt will self report R LE pain no greater than 3/10 at worst for improved comfort and function    Baseline 10/10 at worst    Time 8    Period Weeks    Status New    Target Date 10/18/20  Plan - 09/21/20 1046     Clinical Impression Statement Patient tolerated therapy well with no adverse effects. Therapy focused on continued progression of ankle stretching and strengthening exercises as well as balance. He is progressing well with his strengthening and balance exercises, and able to tolerate greater level of weight bearing exercises this visit. He does demonstrate continued improvement in his ankle range of motion. Patient would benefit from continued skilled PT to progress mobility and strength to reduce pain and improve walking to maximize functional ability.    PT Treatment/Interventions  ADLs/Self Care Home Management;Electrical Stimulation;Moist Heat;Cryotherapy;DME Instruction;Gait training;Stair training;Functional mobility training;Therapeutic activities;Therapeutic exercise;Balance training;Neuromuscular re-education;Patient/family education;Manual techniques;Vasopneumatic Device    PT Next Visit Plan Review HEP and progress PRN, manual and stretching for ankle motion, progress ankle strengthening, initiate weight shifting and gait without boot as tolerated    PT Home Exercise Plan [phone]    Consulted and Agree with Plan of Care Patient             Patient will benefit from skilled therapeutic intervention in order to improve the following deficits and impairments:  Abnormal gait, Decreased activity tolerance, Decreased balance, Decreased coordination, Decreased endurance, Decreased range of motion, Decreased strength, Difficulty walking, Impaired flexibility, Pain  Visit Diagnosis: Pain in right lower leg  Muscle weakness (generalized)  Other abnormalities of gait and mobility     Problem List Patient Active Problem List   Diagnosis Date Noted   Hospice care patient 09/12/2019   Spinal stenosis of lumbar region 09/11/2019    Rosana Hoes, PT, DPT, LAT, ATC 09/21/20  11:22 AM Phone: [phone] Fax: [phone]   [facility] Outpatient Rehabilitation [facility] [address] [place], [place], [address] Phone: [phone]   Fax:  [phone]  Name: Manuel Pearson MRN: [id number] Date of Birth: October 09, 1965

## 2020-09-24 ENCOUNTER — Other Ambulatory Visit: Payer: Self-pay

## 2020-09-24 ENCOUNTER — Ambulatory Visit: Payer: No Typology Code available for payment source | Admitting: Physical Therapy

## 2020-09-24 ENCOUNTER — Encounter: Payer: Self-pay | Admitting: Physical Therapy

## 2020-09-24 DIAGNOSIS — M79661 Pain in right lower leg: Secondary | ICD-10-CM

## 2020-09-24 DIAGNOSIS — R2689 Other abnormalities of gait and mobility: Secondary | ICD-10-CM

## 2020-09-24 DIAGNOSIS — M6281 Muscle weakness (generalized): Secondary | ICD-10-CM

## 2020-09-24 NOTE — Therapy (Signed)
[facility]patient [facility] [address] [place], [place], [address] Phone: [phone]   Fax:  [phone]  Physical Therapy Treatment  Patient Details  Name: Manuel Pearson MRN: [id number] Date of Birth: 01-Aug-1965 Referring Provider (PT): Dannielle Huh, MD   Encounter Date: 09/24/2020   PT End of Session - 09/24/20 1107     Visit Number 10    Number of Visits 15    Date for PT Re-Evaluation 10/30/20    Authorization Type [facility] Authorized Visits    Authorization - Visit Number 10    Authorization - Number of Visits 15    PT Start Time 1045    PT Stop Time 1125    PT Time Calculation (min) 40 min    Activity Tolerance Patient tolerated treatment well    Behavior During Therapy WFL for tasks assessed/performed             Past Medical History:  Diagnosis Date   Anxiety    Elevated liver enzymes    has had increases due to headache meds, but back to normal    GERD (gastroesophageal reflux disease)    Headache(784.0)    MIGRAINES    Hypertension    Hypothyroidism    PONV (postoperative nausea and vomiting)    DIFFICULTY WAKING UP    PTSD (post-traumatic stress disorder)    Sleep apnea 2019    Past Surgical History:  Procedure Laterality Date   ANTERIOR CERVICAL DECOMP/DISCECTOMY FUSION  03/29/2011   Procedure: ANTERIOR CERVICAL DECOMPRESSION/DISCECTOMY FUSION 2 LEVELS;  Surgeon: Reinaldo Meeker, MD;  Location: [facility];  Service: Neurosurgery;  Laterality: N/A;  CERVICAL FOUR-FIVE, CERVICAL SIX-SEVEN  ANTERIOR CERVICAL DECOMPRESSION FUSION  WITH TRABECULAR METAL  AND PLATE   APPENDECTOMY  03/2017   DISTAL BICEPS TENDON REPAIR  2008   LEFT    LUMBAR LAMINECTOMY/DECOMPRESSION MICRODISCECTOMY Right 09/11/2019   Procedure: Laminectomy and Foraminotomy - Lumbar Two-Three - right;  Surgeon: Donalee Citrin, MD;  Location: [facility] OR;  Service: Neurosurgery;  Laterality: Right;  right   NECK SURGERY  1995   FUSION    SHOULDER SURGERY  2000    RIGHT     LEFT  2004     There were no vitals filed for this visit.   Subjective Assessment - 09/24/20 1102     Subjective Patient reports he may have leaned out on his foot a little too much this morning and he felt a pop, which then he had some pain and swelling that is still happening now.    Patient Stated Goals Pt would like to improve mobility and decrease pain in order to get back to yard work    Currently in Pain? Yes    Pain Score 6     Pain Location Leg    Pain Orientation Right;Lower    Pain Descriptors / Indicators Sore;Tightness    Pain Type Chronic pain    Pain Onset More than a month ago    Pain Frequency Constant                               [facility] Adult PT Treatment/Exercise - 09/24/20 0001       Exercises   Exercises Ankle      Manual Therapy   Manual Therapy Joint mobilization;Passive ROM    Joint Mobilization Talocrural and subtalar mobs grade III-IV all directions    Passive ROM Right ankle all directions  Ankle Exercises: Stretches   Slant Board Stretch 3 reps;30 seconds      Ankle Exercises: Aerobic   Nustep L5 x 5 min with LE only, focus on ankle motion, while taking subjective      Ankle Exercises: Seated   Towel Crunch 3 reps   20 reps each   Towel Crunch Weights (lbs) 3    Heel Raises 15 reps   2 sets   Heel Raises Limitations 15#    BAPS Sitting;Level 4;15 reps   2 sets, fwd/bwd lateral cw/ccw     Ankle Exercises: Supine   T-Band 4-way ankle with green 2 x 15 each                    PT Education - 09/24/20 1104     Education Details HEP    Person(s) Educated Patient    Methods Explanation;Demonstration;Verbal cues    Comprehension Verbalized understanding;Returned demonstration;Verbal cues required;Need further instruction              PT Short Term Goals - 09/16/20 1158       PT SHORT TERM GOAL #1   Title Patient will be I with initial HEP to progress with PT    Baseline patient I with  initial HEP    Period Weeks    Status Achieved    Target Date 09/13/20      PT SHORT TERM GOAL #2   Title Pt will decrease 5xSTS to no greater than 12 sec with no inc in pain for improve balance and functional ability    Baseline 10 sec    Time 3    Period Weeks    Status Achieved    Target Date 09/13/20               PT Long Term Goals - 08/23/20 1738       PT LONG TERM GOAL #1   Title Patient will be I with final HEP to maintain progress from PT    Time 8    Period Weeks    Status New    Target Date 10/18/20      PT LONG TERM GOAL #2   Title Pt will improve R ankle DF to no less than 10 deg in order to improve functional mobility    Baseline see flowsheet    Time 8    Period Weeks    Status New    Target Date 10/18/20      PT LONG TERM GOAL #3   Title Pt will improve TUG to no greater than 15 sec with LRAD in order to improve safety and mobility    Baseline 25 sec w/ standard walker    Time 8    Period Weeks    Status New    Target Date 10/18/20      PT LONG TERM GOAL #4   Title Pt will self report R LE pain no greater than 3/10 at worst for improved comfort and function    Baseline 10/10 at worst    Time 8    Period Weeks    Status New    Target Date 10/18/20                   Plan - 09/24/20 1109     Clinical Impression Statement Patient tolerated therapy well with no adverse effects. He arrived to therapy reporting increased discomfort and swelling so regressed strengthening this visit to more table based strengthening.  He does continue to exhibit good ankle motion. Patient encouraged to continue ice, elevation, and compression for pain and swelling control. He was instructed her could begin weaning from the cane as he feels comfortable while in the house. Patient would benefit from continued skilled PT to progress mobility and strength to reduce pain and improve walking to maximize functional ability.    PT Treatment/Interventions ADLs/Self  Care Home Management;Electrical Stimulation;Moist Heat;Cryotherapy;DME Instruction;Gait training;Stair training;Functional mobility training;Therapeutic activities;Therapeutic exercise;Balance training;Neuromuscular re-education;Patient/family education;Manual techniques;Vasopneumatic Device    PT Next Visit Plan Review HEP and progress PRN, manual and stretching for ankle motion, progress ankle strengthening, initiate weight shifting and gait without boot as tolerated    PT Home Exercise Plan [phone]    Consulted and Agree with Plan of Care Patient             Patient will benefit from skilled therapeutic intervention in order to improve the following deficits and impairments:  Abnormal gait, Decreased activity tolerance, Decreased balance, Decreased coordination, Decreased endurance, Decreased range of motion, Decreased strength, Difficulty walking, Impaired flexibility, Pain  Visit Diagnosis: Pain in right lower leg  Muscle weakness (generalized)  Other abnormalities of gait and mobility     Problem List Patient Active Problem List   Diagnosis Date Noted   Hospice care patient 09/12/2019   Spinal stenosis of lumbar region 09/11/2019    Rosana Hoes, PT, DPT, LAT, ATC 09/24/20  11:31 AM Phone: [phone] Fax: [phone]   [facility] Outpatient Rehabilitation [facility] [address] [place], [place], [address] Phone: [phone]   Fax:  [phone]  Name: Manuel Pearson MRN: [id number] Date of Birth: 22-Nov-1965

## 2020-09-28 ENCOUNTER — Other Ambulatory Visit: Payer: Self-pay

## 2020-09-28 ENCOUNTER — Encounter: Payer: Self-pay | Admitting: Physical Therapy

## 2020-09-28 ENCOUNTER — Ambulatory Visit: Payer: No Typology Code available for payment source | Admitting: Physical Therapy

## 2020-09-28 DIAGNOSIS — G8929 Other chronic pain: Secondary | ICD-10-CM

## 2020-09-28 DIAGNOSIS — R2689 Other abnormalities of gait and mobility: Secondary | ICD-10-CM

## 2020-09-28 DIAGNOSIS — M79661 Pain in right lower leg: Secondary | ICD-10-CM | POA: Diagnosis not present

## 2020-09-28 DIAGNOSIS — M6281 Muscle weakness (generalized): Secondary | ICD-10-CM

## 2020-09-28 NOTE — Therapy (Signed)
[facility] Outpatient Rehabilitation [facility] [address] [place], [place], [address] Phone: [phone]   Fax:  [phone]  Physical Therapy Treatment  Patient Details  Name: Manuel Pearson MRN: [id number] Date of Birth: August 28, 1965 Referring Provider (PT): Dannielle Huh, MD   Encounter Date: 09/28/2020   PT End of Session - 09/28/20 1055     Visit Number 11    Number of Visits 15    Date for PT Re-Evaluation 10/30/20    Authorization Type [facility] Authorized Visits    Authorization - Visit Number 11    Authorization - Number of Visits 15    PT Start Time 1045    PT Stop Time 1125    PT Time Calculation (min) 40 min    Activity Tolerance Patient tolerated treatment well    Behavior During Therapy WFL for tasks assessed/performed             Past Medical History:  Diagnosis Date   Anxiety    Elevated liver enzymes    has had increases due to headache meds, but back to normal    GERD (gastroesophageal reflux disease)    Headache(784.0)    MIGRAINES    Hypertension    Hypothyroidism    PONV (postoperative nausea and vomiting)    DIFFICULTY WAKING UP    PTSD (post-traumatic stress disorder)    Sleep apnea 2019    Past Surgical History:  Procedure Laterality Date   ANTERIOR CERVICAL DECOMP/DISCECTOMY FUSION  03/29/2011   Procedure: ANTERIOR CERVICAL DECOMPRESSION/DISCECTOMY FUSION 2 LEVELS;  Surgeon: Reinaldo Meeker, MD;  Location: [facility];  Service: Neurosurgery;  Laterality: N/A;  CERVICAL FOUR-FIVE, CERVICAL SIX-SEVEN  ANTERIOR CERVICAL DECOMPRESSION FUSION  WITH TRABECULAR METAL  AND PLATE   APPENDECTOMY  03/2017   DISTAL BICEPS TENDON REPAIR  2008   LEFT    LUMBAR LAMINECTOMY/DECOMPRESSION MICRODISCECTOMY Right 09/11/2019   Procedure: Laminectomy and Foraminotomy - Lumbar Two-Three - right;  Surgeon: Donalee Citrin, MD;  Location: [facility] OR;  Service: Neurosurgery;  Laterality: Right;  right   NECK SURGERY  1995   FUSION    SHOULDER SURGERY  2000    RIGHT     LEFT  2004     There were no vitals filed for this visit.   Subjective Assessment - 09/28/20 1052     Subjective Patient reports that he got stung on the right ankle by a bee after last visit. His ankle was swollen over the weekend so he just elevated, compressed, and iced his ankle.    Patient Stated Goals Pt would like to improve mobility and decrease pain in order to get back to yard work    Currently in Pain? Yes    Pain Score 5     Pain Location Leg    Pain Orientation Right;Lower    Pain Descriptors / Indicators Aching;Sore;Tightness    Pain Type Chronic pain    Pain Onset More than a month ago    Pain Frequency Constant                [facility] PT Assessment - 09/28/20 0001       AROM   Right Ankle Dorsiflexion 8                           [facility] Adult PT Treatment/Exercise - 09/28/20 0001       Exercises   Exercises Ankle      Ankle Exercises: Stretches  Slant Board Stretch 3 reps;30 seconds      Ankle Exercises: Aerobic   Nustep L5 x 5 min with LE only, focus on ankle motion, while taking subjective      Ankle Exercises: Standing   Rocker Board 1 minute   4 sets   Rocker Board Limitations fwd/bwd taps x 3 without UE support, lateral taps x 1 with UE support    Heel Raises 15 reps   2 sets     Ankle Exercises: Seated   Heel Raises 15 reps   2 sets   Heel Raises Limitations 20#    BAPS Sitting;Level 4;15 reps   2 sets, fwd/bwd lateral cw/ccw     Ankle Exercises: Supine   T-Band 4-way ankle with blue 2 x 15 each                    PT Education - 09/28/20 1055     Education Details HEP    Person(s) Educated Patient    Methods Explanation;Demonstration;Verbal cues    Comprehension Verbalized understanding;Returned demonstration;Verbal cues required;Need further instruction              PT Short Term Goals - 09/16/20 1158       PT SHORT TERM GOAL #1   Title Patient will be I with initial HEP to progress  with PT    Baseline patient I with initial HEP    Period Weeks    Status Achieved    Target Date 09/13/20      PT SHORT TERM GOAL #2   Title Pt will decrease 5xSTS to no greater than 12 sec with no inc in pain for improve balance and functional ability    Baseline 10 sec    Time 3    Period Weeks    Status Achieved    Target Date 09/13/20               PT Long Term Goals - 08/23/20 1738       PT LONG TERM GOAL #1   Title Patient will be I with final HEP to maintain progress from PT    Time 8    Period Weeks    Status New    Target Date 10/18/20      PT LONG TERM GOAL #2   Title Pt will improve R ankle DF to no less than 10 deg in order to improve functional mobility    Baseline see flowsheet    Time 8    Period Weeks    Status New    Target Date 10/18/20      PT LONG TERM GOAL #3   Title Pt will improve TUG to no greater than 15 sec with LRAD in order to improve safety and mobility    Baseline 25 sec w/ standard walker    Time 8    Period Weeks    Status New    Target Date 10/18/20      PT LONG TERM GOAL #4   Title Pt will self report R LE pain no greater than 3/10 at worst for improved comfort and function    Baseline 10/10 at worst    Time 8    Period Weeks    Status New    Target Date 10/18/20                   Plan - 09/28/20 1129     Clinical Impression Statement Patient tolerated therapy well  with no adverse effects. He did exhibit increased swelling this visit due to bee sting since last visit. Therapy continued to work on progressing ankle mobility and strength with good tolerance. Patient denies any increase in pain with exercise. He does continue to demonstrate antalgic gait on the right. Patient would benefit from continued skilled PT to progress mobility and strength to reduce pain and improve walking to maximize functional ability.    PT Treatment/Interventions ADLs/Self Care Home Management;Electrical Stimulation;Moist  Heat;Cryotherapy;DME Instruction;Gait training;Stair training;Functional mobility training;Therapeutic activities;Therapeutic exercise;Balance training;Neuromuscular re-education;Patient/family education;Manual techniques;Vasopneumatic Device    PT Next Visit Plan Review HEP and progress PRN, manual and stretching for ankle motion, progress ankle strengthening, initiate weight shifting and gait without boot as tolerated    PT Home Exercise Plan [phone]    Consulted and Agree with Plan of Care Patient             Patient will benefit from skilled therapeutic intervention in order to improve the following deficits and impairments:  Abnormal gait, Decreased activity tolerance, Decreased balance, Decreased coordination, Decreased endurance, Decreased range of motion, Decreased strength, Difficulty walking, Impaired flexibility, Pain  Visit Diagnosis: Pain in right lower leg  Muscle weakness (generalized)  Other abnormalities of gait and mobility  Chronic bilateral low back pain, unspecified whether sciatica present     Problem List Patient Active Problem List   Diagnosis Date Noted   Hospice care patient 09/12/2019   Spinal stenosis of lumbar region 09/11/2019    Rosana Hoes, PT, DPT, LAT, ATC 09/28/20  11:33 AM Phone: [phone] Fax: [phone]   [facility] Outpatient Rehabilitation [facility] [address] [place], [place], [address] Phone: [phone]   Fax:  [phone]  Name: KINSLER SOEDER MRN: [id number] Date of Birth: Aug 12, 1965

## 2020-10-02 ENCOUNTER — Encounter: Payer: Self-pay | Admitting: Physical Therapy

## 2020-10-02 ENCOUNTER — Ambulatory Visit: Payer: No Typology Code available for payment source | Admitting: Physical Therapy

## 2020-10-02 ENCOUNTER — Other Ambulatory Visit: Payer: Self-pay

## 2020-10-02 DIAGNOSIS — M79661 Pain in right lower leg: Secondary | ICD-10-CM | POA: Diagnosis not present

## 2020-10-02 DIAGNOSIS — M6281 Muscle weakness (generalized): Secondary | ICD-10-CM

## 2020-10-02 DIAGNOSIS — R2689 Other abnormalities of gait and mobility: Secondary | ICD-10-CM

## 2020-10-02 NOTE — Therapy (Signed)
[facility] Outpatient Rehabilitation [facility] [address] [place], [place], [address] Phone: [phone]   Fax:  [phone]  Physical Therapy Treatment  Patient Details  Name: Manuel Pearson MRN: [id number] Date of Birth: [birth year] Referring Provider (PT): Dannielle Huh, MD   Encounter Date: 10/02/2020   PT End of Session - 10/02/20 1045     Visit Number 12    Number of Visits 15    Date for PT Re-Evaluation 10/30/20    Authorization Type [facility] Authorized Visits    Authorization - Visit Number 12    Authorization - Number of Visits 15    PT Start Time 1040    PT Stop Time 1120    PT Time Calculation (min) 40 min    Activity Tolerance Patient tolerated treatment well    Behavior During Therapy WFL for tasks assessed/performed             Past Medical History:  Diagnosis Date   Anxiety    Elevated liver enzymes    has had increases due to headache meds, but back to normal    GERD (gastroesophageal reflux disease)    Headache(784.0)    MIGRAINES    Hypertension    Hypothyroidism    PONV (postoperative nausea and vomiting)    DIFFICULTY WAKING UP    PTSD (post-traumatic stress disorder)    Sleep apnea 2019    Past Surgical History:  Procedure Laterality Date   ANTERIOR CERVICAL DECOMP/DISCECTOMY FUSION  03/29/2011   Procedure: ANTERIOR CERVICAL DECOMPRESSION/DISCECTOMY FUSION 2 LEVELS;  Surgeon: Reinaldo Meeker, MD;  Location: [facility];  Service: Neurosurgery;  Laterality: N/A;  CERVICAL FOUR-FIVE, CERVICAL SIX-SEVEN  ANTERIOR CERVICAL DECOMPRESSION FUSION  WITH TRABECULAR METAL  AND PLATE   APPENDECTOMY  03/2017   DISTAL BICEPS TENDON REPAIR  2008   LEFT    LUMBAR LAMINECTOMY/DECOMPRESSION MICRODISCECTOMY Right 09/11/2019   Procedure: Laminectomy and Foraminotomy - Lumbar Two-Three - right;  Surgeon: Donalee Citrin, MD;  Location: [facility] OR;  Service: Neurosurgery;  Laterality: Right;  right   NECK SURGERY  1995   FUSION    SHOULDER SURGERY  2000    RIGHT     LEFT  2004     There were no vitals filed for this visit.   Subjective Assessment - 10/02/20 1042     Subjective Patient reports he had botox for his migraines and he has been now dealing with a migraine for the past 2 days. He also notes that he slipped in the shower this morning and tweaked his ankle.    Patient Stated Goals Pt would like to improve mobility and decrease pain in order to get back to yard work    Currently in Pain? Yes    Pain Score 5     Pain Location Leg    Pain Orientation Right;Lower    Pain Descriptors / Indicators Aching;Sore;Tightness    Pain Type Chronic pain    Pain Onset More than a month ago    Pain Frequency Constant                [facility] PT Assessment - 10/02/20 0001       AROM   Right Ankle Dorsiflexion 8    Right Ankle Plantar Flexion 48                           [facility] Adult PT Treatment/Exercise - 10/02/20 0001  Exercises   Exercises Ankle      Ankle Exercises: Stretches   Slant Board Stretch 3 reps;30 seconds      Ankle Exercises: Aerobic   Nustep L5 x 5 min with LE only, focus on ankle motion, while taking subjective      Ankle Exercises: Standing   Rocker Board 1 minute   3 sets   Rocker Board Limitations fwd/bwd taps without UE support    Heel Raises 15 reps   3 sets     Ankle Exercises: Seated   Heel Raises 15 reps   2 sets   Heel Raises Limitations 25#      Ankle Exercises: Supine   T-Band 4-way ankle with blue 2 x 20 each                    PT Education - 10/02/20 1044     Education Details HEP    Person(s) Educated Patient    Methods Explanation;Demonstration;Verbal cues    Comprehension Verbalized understanding;Returned demonstration;Verbal cues required;Need further instruction              PT Short Term Goals - 09/16/20 1158       PT SHORT TERM GOAL #1   Title Patient will be I with initial HEP to progress with PT    Baseline patient I with initial HEP     Period Weeks    Status Achieved    Target Date 09/13/20      PT SHORT TERM GOAL #2   Title Pt will decrease 5xSTS to no greater than 12 sec with no inc in pain for improve balance and functional ability    Baseline 10 sec    Time 3    Period Weeks    Status Achieved    Target Date 09/13/20               PT Long Term Goals - 08/23/20 1738       PT LONG TERM GOAL #1   Title Patient will be I with final HEP to maintain progress from PT    Time 8    Period Weeks    Status New    Target Date 10/18/20      PT LONG TERM GOAL #2   Title Pt will improve R ankle DF to no less than 10 deg in order to improve functional mobility    Baseline see flowsheet    Time 8    Period Weeks    Status New    Target Date 10/18/20      PT LONG TERM GOAL #3   Title Pt will improve TUG to no greater than 15 sec with LRAD in order to improve safety and mobility    Baseline 25 sec w/ standard walker    Time 8    Period Weeks    Status New    Target Date 10/18/20      PT LONG TERM GOAL #4   Title Pt will self report R LE pain no greater than 3/10 at worst for improved comfort and function    Baseline 10/10 at worst    Time 8    Period Weeks    Status New    Target Date 10/18/20                   Plan - 10/02/20 1045     Clinical Impression Statement Patient tolerated therapy well with no adverse effects. Therapy focused on  progressing ankle flexibility and strength. He is progressing well with therapy but continues to demonstrate increased ankle edema. He is walking well using SPC and was instructed he could begin weaning from can on level familiar environments. Patient encouraged to continue elevation, ice, compression for swelling. Patient would benefit from continued skilled PT to progress mobility and strength to reduce pain and improve walking to maximize functional ability.    PT Treatment/Interventions ADLs/Self Care Home Management;Electrical Stimulation;Moist  Heat;Cryotherapy;DME Instruction;Gait training;Stair training;Functional mobility training;Therapeutic activities;Therapeutic exercise;Balance training;Neuromuscular re-education;Patient/family education;Manual techniques;Vasopneumatic Device    PT Next Visit Plan Review HEP and progress PRN, manual and stretching for ankle motion, progress ankle strengthening, initiate weight shifting and gait without boot as tolerated    PT Home Exercise Plan [phone]    Consulted and Agree with Plan of Care Patient             Patient will benefit from skilled therapeutic intervention in order to improve the following deficits and impairments:  Abnormal gait, Decreased activity tolerance, Decreased balance, Decreased coordination, Decreased endurance, Decreased range of motion, Decreased strength, Difficulty walking, Impaired flexibility, Pain  Visit Diagnosis: Pain in right lower leg  Muscle weakness (generalized)  Other abnormalities of gait and mobility     Problem List Patient Active Problem List   Diagnosis Date Noted   Hospice care patient 09/12/2019   Spinal stenosis of lumbar region 09/11/2019    Rosana Hoes, PT, DPT, LAT, ATC 10/02/20  11:22 AM Phone: [phone] Fax: [phone]   [facility] Outpatient Rehabilitation [facility] [address] [place], [place], [address] Phone: [phone]   Fax:  [phone]  Name: Manuel Pearson MRN: [id number] Date of Birth: Aug 07, 1965

## 2020-10-05 ENCOUNTER — Ambulatory Visit: Payer: No Typology Code available for payment source | Admitting: Physical Therapy

## 2020-10-06 ENCOUNTER — Encounter: Payer: Self-pay | Admitting: Physical Therapy

## 2020-10-06 ENCOUNTER — Ambulatory Visit: Payer: No Typology Code available for payment source | Admitting: Physical Therapy

## 2020-10-06 ENCOUNTER — Other Ambulatory Visit: Payer: Self-pay

## 2020-10-06 DIAGNOSIS — M79661 Pain in right lower leg: Secondary | ICD-10-CM

## 2020-10-06 DIAGNOSIS — M6281 Muscle weakness (generalized): Secondary | ICD-10-CM

## 2020-10-06 DIAGNOSIS — R2689 Other abnormalities of gait and mobility: Secondary | ICD-10-CM

## 2020-10-06 NOTE — Therapy (Signed)
[facility] [address] [place], [place], [address] Phone: [phone]   Fax:  [phone]  Physical Therapy Treatment  Patient Details  Name: Manuel Pearson MRN: [id number] Date of Birth: 1965/05/23 Referring Provider (PT): Dannielle Huh, MD   Encounter Date: 10/06/2020   PT End of Session - 10/06/20 1212     Visit Number 13    Number of Visits 15    Date for PT Re-Evaluation 10/30/20    Authorization Type [facility] Authorized Visits    Authorization - Visit Number 13    Authorization - Number of Visits 15    PT Start Time 1210    PT Stop Time 1250    PT Time Calculation (min) 40 min    Activity Tolerance Patient tolerated treatment well    Behavior During Therapy WFL for tasks assessed/performed             Past Medical History:  Diagnosis Date   Anxiety    Elevated liver enzymes    has had increases due to headache meds, but back to normal    GERD (gastroesophageal reflux disease)    Headache(784.0)    MIGRAINES    Hypertension    Hypothyroidism    PONV (postoperative nausea and vomiting)    DIFFICULTY WAKING UP    PTSD (post-traumatic stress disorder)    Sleep apnea 2019    Past Surgical History:  Procedure Laterality Date   ANTERIOR CERVICAL DECOMP/DISCECTOMY FUSION  03/29/2011   Procedure: ANTERIOR CERVICAL DECOMPRESSION/DISCECTOMY FUSION 2 LEVELS;  Surgeon: Reinaldo Meeker, MD;  Location: [facility];  Service: Neurosurgery;  Laterality: N/A;  CERVICAL FOUR-FIVE, CERVICAL SIX-SEVEN  ANTERIOR CERVICAL DECOMPRESSION FUSION  WITH TRABECULAR METAL  AND PLATE   APPENDECTOMY  03/2017   DISTAL BICEPS TENDON REPAIR  2008   LEFT    LUMBAR LAMINECTOMY/DECOMPRESSION MICRODISCECTOMY Right 09/11/2019   Procedure: Laminectomy and Foraminotomy - Lumbar Two-Three - right;  Surgeon: Donalee Citrin, MD;  Location: [facility] OR;  Service: Neurosurgery;  Laterality: Right;  right   NECK SURGERY  1995   FUSION    SHOULDER SURGERY  2000    RIGHT     LEFT  2004     There were no vitals filed for this visit.   Subjective Assessment - 10/06/20 1210     Subjective Patient reports he is doing well, with no new issues. He notes that he was on his feet a lot this past weekend and his ankle is more swollen and painful.    Patient Stated Goals Pt would like to improve mobility and decrease pain in order to get back to yard work    Currently in Pain? Yes    Pain Location Leg    Pain Orientation Right;Lower    Pain Descriptors / Indicators Aching;Sore;Tightness    Pain Type Chronic pain    Pain Onset More than a month ago    Pain Frequency Constant                [facility] PT Assessment - 10/06/20 0001       AROM   Right Ankle Dorsiflexion 8    Right Ankle Plantar Flexion 50    Right Ankle Inversion 45    Right Ankle Eversion 25                           [facility] Adult PT Treatment/Exercise - 10/06/20 0001  Exercises   Exercises Ankle      Manual Therapy   Manual Therapy Joint mobilization;Passive ROM    Joint Mobilization Talocrural and subtalar mobs grade III-IV all directions    Passive ROM Right ankle all directions      Ankle Exercises: Aerobic   Nustep L6 x 5 min with LE only, focus on ankle motion, while taking subjective      Ankle Exercises: Machines for Strengthening   Cybex Leg Press 65# x 15, 85# 2 x 15      Ankle Exercises: Standing   Heel Raises 15 reps   2 sets     Ankle Exercises: Supine   T-Band 4-way ankle with blue 2 x 20 each                    PT Education - 10/06/20 1212     Education Details HEP    Person(s) Educated Patient    Methods Explanation;Demonstration;Verbal cues    Comprehension Verbalized understanding;Returned demonstration;Verbal cues required;Need further instruction              PT Short Term Goals - 09/16/20 1158       PT SHORT TERM GOAL #1   Title Patient will be I with initial HEP to progress with PT    Baseline patient  I with initial HEP    Period Weeks    Status Achieved    Target Date 09/13/20      PT SHORT TERM GOAL #2   Title Pt will decrease 5xSTS to no greater than 12 sec with no inc in pain for improve balance and functional ability    Baseline 10 sec    Time 3    Period Weeks    Status Achieved    Target Date 09/13/20               PT Long Term Goals - 08/23/20 1738       PT LONG TERM GOAL #1   Title Patient will be I with final HEP to maintain progress from PT    Time 8    Period Weeks    Status New    Target Date 10/18/20      PT LONG TERM GOAL #2   Title Pt will improve R ankle DF to no less than 10 deg in order to improve functional mobility    Baseline see flowsheet    Time 8    Period Weeks    Status New    Target Date 10/18/20      PT LONG TERM GOAL #3   Title Pt will improve TUG to no greater than 15 sec with LRAD in order to improve safety and mobility    Baseline 25 sec w/ standard walker    Time 8    Period Weeks    Status New    Target Date 10/18/20      PT LONG TERM GOAL #4   Title Pt will self report R LE pain no greater than 3/10 at worst for improved comfort and function    Baseline 10/10 at worst    Time 8    Period Weeks    Status New    Target Date 10/18/20                   Plan - 10/06/20 1213     Clinical Impression Statement Patient tolerated therapy well with no adverse effects. He continues to demonstrate increased swelling  of the ankle with more soreness this visit from an increase in activity/standing over the weekend. Therapy continued focus on progressing ankle motion and strength, and incorporated general LE strengthening with leg press. He is progressing well with therapyy and demonstrates improved ankle motion, and would likely be able to wean from the cane beginning at next visit. Patient would benefit from continued skilled PT to progress mobility and strength to reduce pain and improve walking to maximize functional  ability.    PT Treatment/Interventions ADLs/Self Care Home Management;Electrical Stimulation;Moist Heat;Cryotherapy;DME Instruction;Gait training;Stair training;Functional mobility training;Therapeutic activities;Therapeutic exercise;Balance training;Neuromuscular re-education;Patient/family education;Manual techniques;Vasopneumatic Device    PT Next Visit Plan Review HEP and progress PRN, manual and stretching for ankle motion, progress ankle strengthening, initiate weight shifting and gait without boot as tolerated    PT Home Exercise Plan [phone]    Consulted and Agree with Plan of Care Patient             Patient will benefit from skilled therapeutic intervention in order to improve the following deficits and impairments:  Abnormal gait, Decreased activity tolerance, Decreased balance, Decreased coordination, Decreased endurance, Decreased range of motion, Decreased strength, Difficulty walking, Impaired flexibility, Pain  Visit Diagnosis: Pain in right lower leg  Muscle weakness (generalized)  Other abnormalities of gait and mobility     Problem List Patient Active Problem List   Diagnosis Date Noted   Hospice care patient 09/12/2019   Spinal stenosis of lumbar region 09/11/2019    Rosana Hoes, PT, DPT, LAT, ATC 10/06/20  12:57 PM Phone: [phone] Fax: [phone]   [facility] [address] [place], [place], [address] Phone: [phone]   Fax:  [phone]  Name: Manuel Pearson MRN: [id number] Date of Birth: 07/06/1965

## 2020-10-08 ENCOUNTER — Encounter: Payer: Self-pay | Admitting: Physical Therapy

## 2020-10-08 ENCOUNTER — Other Ambulatory Visit: Payer: Self-pay

## 2020-10-08 ENCOUNTER — Ambulatory Visit: Payer: No Typology Code available for payment source | Admitting: Physical Therapy

## 2020-10-08 DIAGNOSIS — R2689 Other abnormalities of gait and mobility: Secondary | ICD-10-CM

## 2020-10-08 DIAGNOSIS — M6281 Muscle weakness (generalized): Secondary | ICD-10-CM

## 2020-10-08 DIAGNOSIS — G8929 Other chronic pain: Secondary | ICD-10-CM

## 2020-10-08 DIAGNOSIS — M79661 Pain in right lower leg: Secondary | ICD-10-CM

## 2020-10-08 DIAGNOSIS — M545 Low back pain, unspecified: Secondary | ICD-10-CM

## 2020-10-08 NOTE — Therapy (Signed)
[facility] [address] [place], [place], [address] Phone: [phone]   Fax:  [phone]  Physical Therapy Treatment  Patient Details  Name: Manuel Pearson MRN: [id number] Date of Birth: 11/11/1965 Referring Provider (PT): Dannielle Huh, MD   Encounter Date: 10/08/2020   PT End of Session - 10/08/20 1109     Visit Number 14    Number of Visits 15    Date for PT Re-Evaluation 10/30/20    Authorization Type [facility] Authorized Visits    Authorization - Visit Number 14    Authorization - Number of Visits 15    PT Start Time 1045    PT Stop Time 1130    PT Time Calculation (min) 45 min    Activity Tolerance Patient tolerated treatment well    Behavior During Therapy WFL for tasks assessed/performed             Past Medical History:  Diagnosis Date   Anxiety    Elevated liver enzymes    has had increases due to headache meds, but back to normal    GERD (gastroesophageal reflux disease)    Headache(784.0)    MIGRAINES    Hypertension    Hypothyroidism    PONV (postoperative nausea and vomiting)    DIFFICULTY WAKING UP    PTSD (post-traumatic stress disorder)    Sleep apnea 2019    Past Surgical History:  Procedure Laterality Date   ANTERIOR CERVICAL DECOMP/DISCECTOMY FUSION  03/29/2011   Procedure: ANTERIOR CERVICAL DECOMPRESSION/DISCECTOMY FUSION 2 LEVELS;  Surgeon: Reinaldo Meeker, MD;  Location: [facility];  Service: Neurosurgery;  Laterality: N/A;  CERVICAL FOUR-FIVE, CERVICAL SIX-SEVEN  ANTERIOR CERVICAL DECOMPRESSION FUSION  WITH TRABECULAR METAL  AND PLATE   APPENDECTOMY  03/2017   DISTAL BICEPS TENDON REPAIR  2008   LEFT    LUMBAR LAMINECTOMY/DECOMPRESSION MICRODISCECTOMY Right 09/11/2019   Procedure: Laminectomy and Foraminotomy - Lumbar Two-Three - right;  Surgeon: Donalee Citrin, MD;  Location: [facility] OR;  Service: Neurosurgery;  Laterality: Right;  right   NECK SURGERY  1995   FUSION    SHOULDER SURGERY  2000    RIGHT     LEFT  2004     There were no vitals filed for this visit.   Subjective Assessment - 10/08/20 1103     Subjective Patient reports increased soreness and swelling this visit because he was on his feet a lot yesterday.    Patient Stated Goals Pt would like to improve mobility and decrease pain in order to get back to yard work    Currently in Pain? Yes    Pain Score 6     Pain Location Leg    Pain Orientation Right;Lower    Pain Descriptors / Indicators Aching;Sore    Pain Type Chronic pain    Pain Onset More than a month ago    Pain Frequency Constant                [facility] PT Assessment - 10/08/20 0001       Assessment   Medical Diagnosis S/P R Distal fibula fx    Referring Provider (PT) Dannielle Huh, MD      Precautions   Precautions None      Restrictions   Weight Bearing Restrictions No      Balance Screen   Has the patient fallen in the past 6 months No    Has the patient had a decrease in activity level because of  a fear of falling?  No    Is the patient reluctant to leave their home because of a fear of falling?  No      Prior Function   Level of Independence Independent    Vocation Retired      Observation/Other Assessments   Focus on Therapeutic Outcomes (FOTO)  47% functional status      AROM   Right Ankle Dorsiflexion 8    Right Ankle Plantar Flexion 50    Right Ankle Inversion 45    Right Ankle Eversion 25      Strength   Right Ankle Dorsiflexion 4+/5    Right Ankle Plantar Flexion 4/5    Right Ankle Inversion 4+/5    Right Ankle Eversion 4+/5      Timed Up and Go Test   Normal TUG (seconds) 10   no AD                          [facility] Adult PT Treatment/Exercise - 10/08/20 0001       Manual Therapy   Manual Therapy Joint mobilization;Passive ROM    Joint Mobilization Talocrural and subtalar mobs grade III-IV all directions    Passive ROM Right ankle all directions      Ankle Exercises: Supine   T-Band 4-way  ankle with blue 2 x 20 each      Ankle Exercises: Standing   Rocker Board 1 minute   3 sets   Rocker Board Limitations fwd/bwd taps without UE support    Heel Raises 15 reps   2 sets     Ankle Exercises: Aerobic   Nustep L6 x 5 min with LE only, focus on ankle motion, while taking subjective      Ankle Exercises: Stretches   Theme park manager Limitations Runner stretch 3 x 20 sec      Ankle Exercises: Machines for Strengthening   Cybex Leg Press 85# 3 x 15                    PT Education - 10/08/20 1104     Education Details HEP    Person(s) Educated Patient    Methods Explanation;Demonstration;Verbal cues    Comprehension Verbalized understanding;Returned demonstration;Verbal cues required;Need further instruction              PT Short Term Goals - 09/16/20 1158       PT SHORT TERM GOAL #1   Title Patient will be I with initial HEP to progress with PT    Baseline patient I with initial HEP    Period Weeks    Status Achieved    Target Date 09/13/20      PT SHORT TERM GOAL #2   Title Pt will decrease 5xSTS to no greater than 12 sec with no inc in pain for improve balance and functional ability    Baseline 10 sec    Time 3    Period Weeks    Status Achieved    Target Date 09/13/20               PT Long Term Goals - 10/08/20 1115       PT LONG TERM GOAL #1   Title Patient will be I with final HEP to maintain progress from PT    Baseline Patient continueing to progress toward final HEP    Time 8    Period Weeks    Status On-going  Target Date 10/18/20      PT LONG TERM GOAL #2   Title Pt will improve R ankle DF to no less than 10 deg in order to improve functional mobility    Baseline right ankle DF 8 deg    Time 8    Period Weeks    Status On-going    Target Date 10/18/20      PT LONG TERM GOAL #3   Title Pt will improve TUG to no greater than 15 sec with LRAD in order to improve safety and mobility    Baseline 10 seconds with no AD     Time 8    Period Weeks    Status Achieved      PT LONG TERM GOAL #4   Title Pt will self report R LE pain no greater than 3/10 at worst for improved comfort and function    Baseline continues to repor 4-6/10 pain level with activity    Time 8    Period Weeks    Status On-going    Target Date 10/18/20                   Plan - 10/08/20 1110     Clinical Impression Statement Patient tolerated therapy well with no adverse effects. He is progressing well toward his goals, demonstrating improved ankle range motion, strength, and functional mobility. He continues to demonstrate limitations in his dorsiflexion range of motion, overall ankle strength, and continues to ambulate using SPC due to ankle pain and weakness. Patient also demonstrates persistent ankle swelling that seems to be limiting his range of motion and contributing to pain level. Patient reports his functional status on FOTO as 47% with a predicted score of 54% functional status. He is progressing well in therapy and would benefit from continued skilled PT to progress mobility and strength to reduce pain and improve walking to maximize functional ability.    Rehab Potential Good    PT Frequency 1x / week    PT Duration 6 weeks    PT Treatment/Interventions ADLs/Self Care Home Management;Electrical Stimulation;Moist Heat;Cryotherapy;DME Instruction;Gait training;Stair training;Functional mobility training;Therapeutic activities;Therapeutic exercise;Balance training;Neuromuscular re-education;Patient/family education;Manual techniques;Vasopneumatic Device    PT Next Visit Plan Review HEP and progress PRN, manual and stretching for ankle motion, progress ankle strengthening, initiate weight shifting and gait without boot as tolerated    PT Home Exercise Plan [phone]    Consulted and Agree with Plan of Care Patient             Patient will benefit from skilled therapeutic intervention in order to improve the following  deficits and impairments:  Abnormal gait, Decreased activity tolerance, Decreased balance, Decreased coordination, Decreased endurance, Decreased range of motion, Decreased strength, Difficulty walking, Impaired flexibility, Pain  Visit Diagnosis: Pain in right lower leg  Muscle weakness (generalized)  Other abnormalities of gait and mobility  Chronic bilateral low back pain, unspecified whether sciatica present     Problem List Patient Active Problem List   Diagnosis Date Noted   Hospice care patient 09/12/2019   Spinal stenosis of lumbar region 09/11/2019    Rosana Hoes, PT, DPT, LAT, ATC 10/08/20  1:27 PM Phone: [phone] Fax: [phone]   [facility] [address] [place], [place], [address] Phone: [phone]   Fax:  [phone]  Name: MALEEK CRAVER MRN: [id number] Date of Birth: 01-Oct-1965

## 2020-10-12 ENCOUNTER — Encounter: Payer: Self-pay | Admitting: Physical Therapy

## 2020-10-12 ENCOUNTER — Other Ambulatory Visit: Payer: Self-pay

## 2020-10-12 ENCOUNTER — Ambulatory Visit: Payer: No Typology Code available for payment source | Attending: Orthopedic Surgery | Admitting: Physical Therapy

## 2020-10-12 DIAGNOSIS — R2689 Other abnormalities of gait and mobility: Secondary | ICD-10-CM

## 2020-10-12 DIAGNOSIS — M6281 Muscle weakness (generalized): Secondary | ICD-10-CM | POA: Diagnosis present

## 2020-10-12 DIAGNOSIS — M79661 Pain in right lower leg: Secondary | ICD-10-CM | POA: Diagnosis present

## 2020-10-12 NOTE — Therapy (Signed)
[facility] Outpatient Rehabilitation [facility] [address] [place], [place], [address] Phone: [phone]   Fax:  [phone]  Physical Therapy Treatment  Patient Details  Name: Manuel Pearson MRN: [id number] Date of Birth: Feb 14, 1966 Referring Provider (PT): Dannielle Huh, MD   Encounter Date: 10/12/2020   PT End of Session - 10/12/20 1036     Visit Number 15    Number of Visits 15    Date for PT Re-Evaluation 10/30/20    Authorization Type [facility] Authorized Visits    Authorization - Visit Number 15    Authorization - Number of Visits 15    PT Start Time 1035    PT Stop Time 1120    PT Time Calculation (min) 45 min    Activity Tolerance Patient tolerated treatment well    Behavior During Therapy WFL for tasks assessed/performed             Past Medical History:  Diagnosis Date   Anxiety    Elevated liver enzymes    has had increases due to headache meds, but back to normal    GERD (gastroesophageal reflux disease)    Headache(784.0)    MIGRAINES    Hypertension    Hypothyroidism    PONV (postoperative nausea and vomiting)    DIFFICULTY WAKING UP    PTSD (post-traumatic stress disorder)    Sleep apnea 2019    Past Surgical History:  Procedure Laterality Date   ANTERIOR CERVICAL DECOMP/DISCECTOMY FUSION  03/29/2011   Procedure: ANTERIOR CERVICAL DECOMPRESSION/DISCECTOMY FUSION 2 LEVELS;  Surgeon: Reinaldo Meeker, MD;  Location: [facility];  Service: Neurosurgery;  Laterality: N/A;  CERVICAL FOUR-FIVE, CERVICAL SIX-SEVEN  ANTERIOR CERVICAL DECOMPRESSION FUSION  WITH TRABECULAR METAL  AND PLATE   APPENDECTOMY  03/2017   DISTAL BICEPS TENDON REPAIR  2008   LEFT    LUMBAR LAMINECTOMY/DECOMPRESSION MICRODISCECTOMY Right 09/11/2019   Procedure: Laminectomy and Foraminotomy - Lumbar Two-Three - right;  Surgeon: Donalee Citrin, MD;  Location: [facility] OR;  Service: Neurosurgery;  Laterality: Right;  right   NECK SURGERY  1995   FUSION    SHOULDER SURGERY  2000    RIGHT     LEFT  2004     There were no vitals filed for this visit.   Subjective Assessment - 10/12/20 1047     Subjective Patient reports he is feeling better today since he took it easy over the weekend.    Patient Stated Goals Pt would like to improve mobility and decrease pain in order to get back to yard work    Currently in Pain? Yes    Pain Score 4     Pain Location Leg    Pain Orientation Right;Lower    Pain Descriptors / Indicators Aching;Sore    Pain Type Chronic pain    Pain Onset More than a month ago    Pain Frequency Constant                [facility] PT Assessment - 10/12/20 0001       AROM   Right Ankle Dorsiflexion 8    Right Ankle Plantar Flexion 50    Right Ankle Inversion 45    Right Ankle Eversion 25                           [facility] Adult PT Treatment/Exercise - 10/12/20 0001       Neuro Re-ed    Neuro Re-ed Details  Tandem stance on Airex 2 x 30 sec each, SLS on Airex 3 x 20 sec each      Exercises   Exercises Ankle      Ankle Exercises: Stretches   Slant Board Stretch 3 reps;30 seconds      Ankle Exercises: Aerobic   Nustep L6 x 5 min with LE only, focus on ankle motion, while taking subjective      Ankle Exercises: Machines for Strengthening   Cybex Leg Press SL 85# 3 x 10      Ankle Exercises: Standing   Rocker Board 1 minute   3 sets   Rocker Board Limitations fwd/bwd x 2 and SL lateral taps x 1    Heel Raises 20 reps   3 sets edge of step     Ankle Exercises: Supine   T-Band Inversion/Eversion with blue 2 x 20 each                    PT Education - 10/12/20 1036     Education Details HEP    Person(s) Educated Patient    Methods Explanation;Demonstration;Verbal cues    Comprehension Verbalized understanding;Returned demonstration;Verbal cues required;Need further instruction              PT Short Term Goals - 09/16/20 1158       PT SHORT TERM GOAL #1   Title Patient will be I with initial  HEP to progress with PT    Baseline patient I with initial HEP    Period Weeks    Status Achieved    Target Date 09/13/20      PT SHORT TERM GOAL #2   Title Pt will decrease 5xSTS to no greater than 12 sec with no inc in pain for improve balance and functional ability    Baseline 10 sec    Time 3    Period Weeks    Status Achieved    Target Date 09/13/20               PT Long Term Goals - 10/08/20 1115       PT LONG TERM GOAL #1   Title Patient will be I with final HEP to maintain progress from PT    Baseline Patient continueing to progress toward final HEP    Time 8    Period Weeks    Status On-going    Target Date 10/18/20      PT LONG TERM GOAL #2   Title Pt will improve R ankle DF to no less than 10 deg in order to improve functional mobility    Baseline right ankle DF 8 deg    Time 8    Period Weeks    Status On-going    Target Date 10/18/20      PT LONG TERM GOAL #3   Title Pt will improve TUG to no greater than 15 sec with LRAD in order to improve safety and mobility    Baseline 10 seconds with no AD    Time 8    Period Weeks    Status Achieved      PT LONG TERM GOAL #4   Title Pt will self report R LE pain no greater than 3/10 at worst for improved comfort and function    Baseline continues to repor 4-6/10 pain level with activity    Time 8    Period Weeks    Status On-going    Target Date 10/18/20  Plan - 10/12/20 1037     Clinical Impression Statement Patient tolerated therapy well with no adverse effects. He does continue to exhibit increased right ankle swelling but patient able to tolerate progression in standing strengthening and balance training this visit. He did report fatigue and soreness post-therapy and insructed to continue ice/elevation for swelling and pain control. He is ambulating more without the cane and demonstrates improved heel-toe progression. Patient would benefit from continued skilled PT to progress  mobility and strength to reduce pain and improve walking to maximize functional ability.    PT Treatment/Interventions ADLs/Self Care Home Management;Electrical Stimulation;Moist Heat;Cryotherapy;DME Instruction;Gait training;Stair training;Functional mobility training;Therapeutic activities;Therapeutic exercise;Balance training;Neuromuscular re-education;Patient/family education;Manual techniques;Vasopneumatic Device    PT Next Visit Plan Review HEP and progress PRN, manual and stretching for ankle motion, progress ankle strengthening, initiate weight shifting and gait without boot as tolerated    PT Home Exercise Plan [phone]    Consulted and Agree with Plan of Care Patient             Patient will benefit from skilled therapeutic intervention in order to improve the following deficits and impairments:  Abnormal gait, Decreased activity tolerance, Decreased balance, Decreased coordination, Decreased endurance, Decreased range of motion, Decreased strength, Difficulty walking, Impaired flexibility, Pain  Visit Diagnosis: Pain in right lower leg  Muscle weakness (generalized)  Other abnormalities of gait and mobility     Problem List Patient Active Problem List   Diagnosis Date Noted   Hospice care patient 09/12/2019   Spinal stenosis of lumbar region 09/11/2019    Rosana Hoes, PT, DPT, LAT, ATC 10/12/20  11:20 AM Phone: [phone] Fax: [phone]   [facility] Outpatient Rehabilitation [facility] [address] [place], [place], [address] Phone: [phone]   Fax:  [phone]  Name: KEELON ZURN MRN: [id number] Date of Birth: 13-Jan-1966

## 2020-10-15 ENCOUNTER — Encounter: Payer: Medicare Other | Admitting: Physical Therapy

## 2020-10-16 IMAGING — CR DG LUMBAR SPINE 2-3V
2 series · 2 of 2 positions shown · non-contrast
Comparison: MRI 08/14/2019

CLINICAL DATA: Peri op

EXAM:
LUMBAR SPINE - 2-3 VIEW

[lateral (1 of 2)]
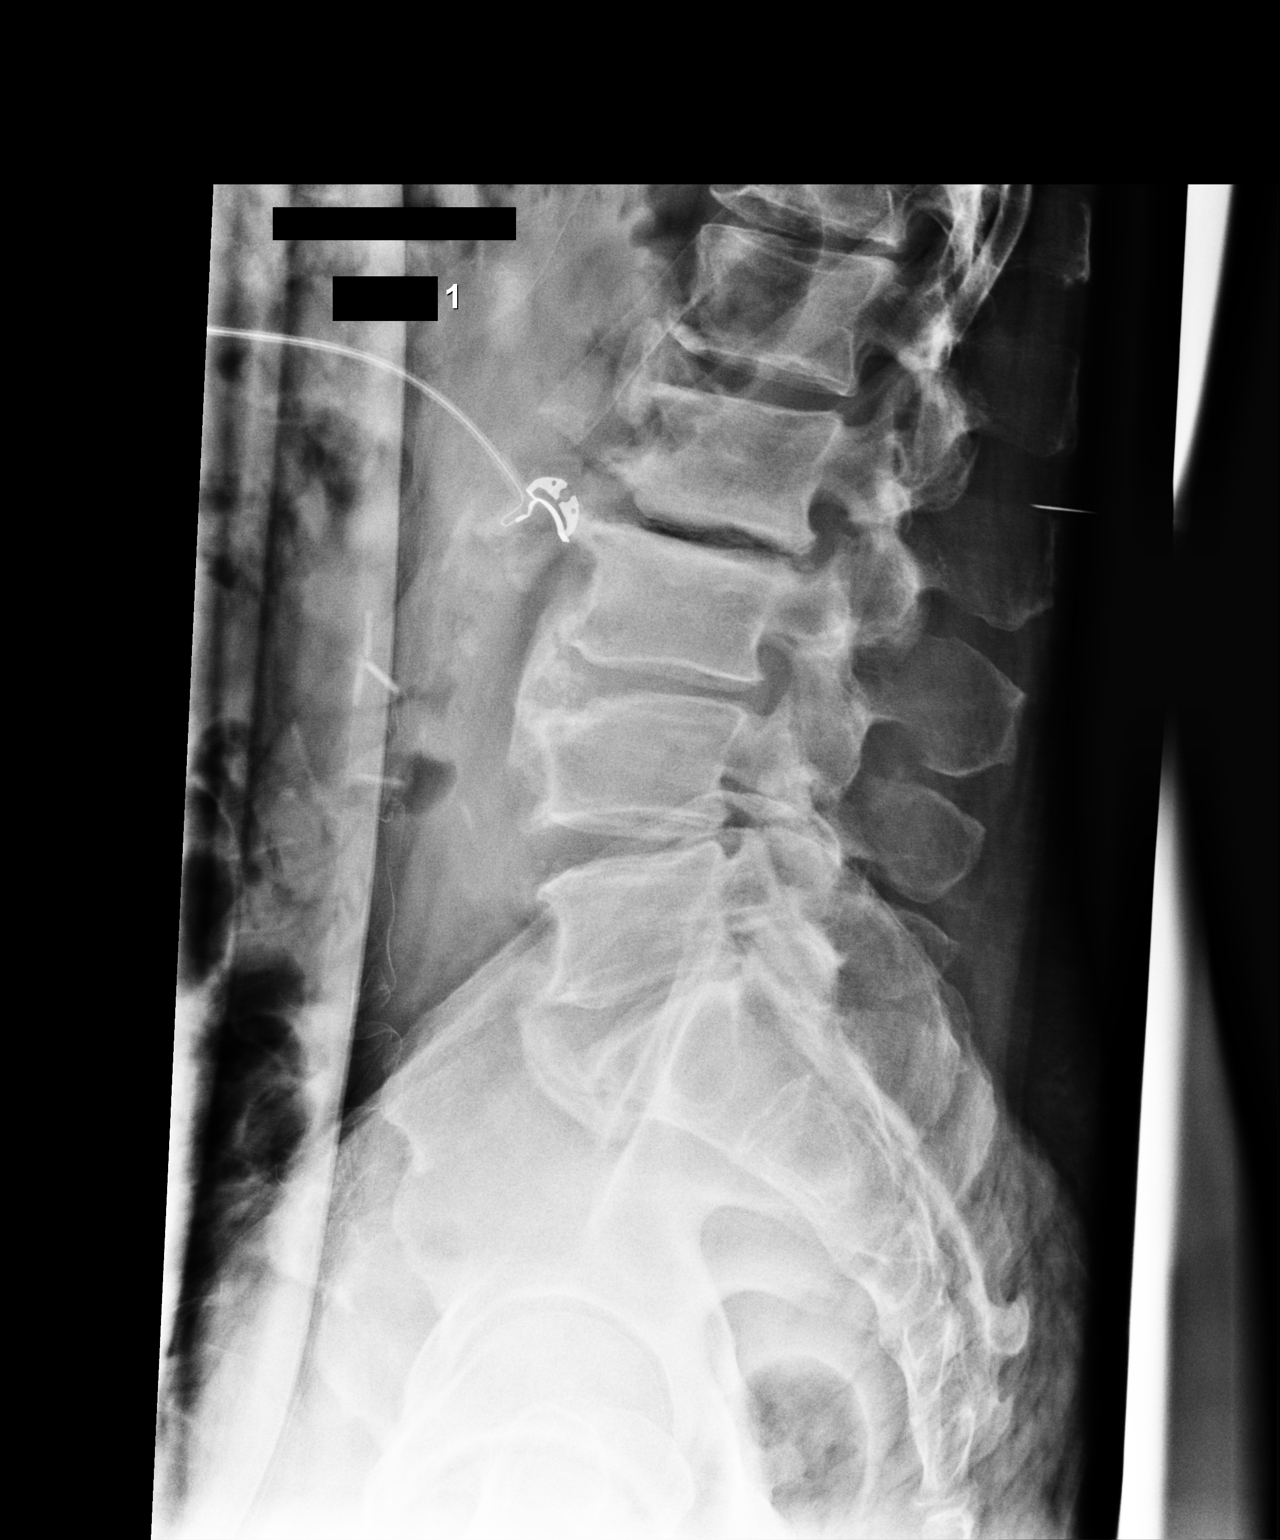

[lateral (2 of 2)]
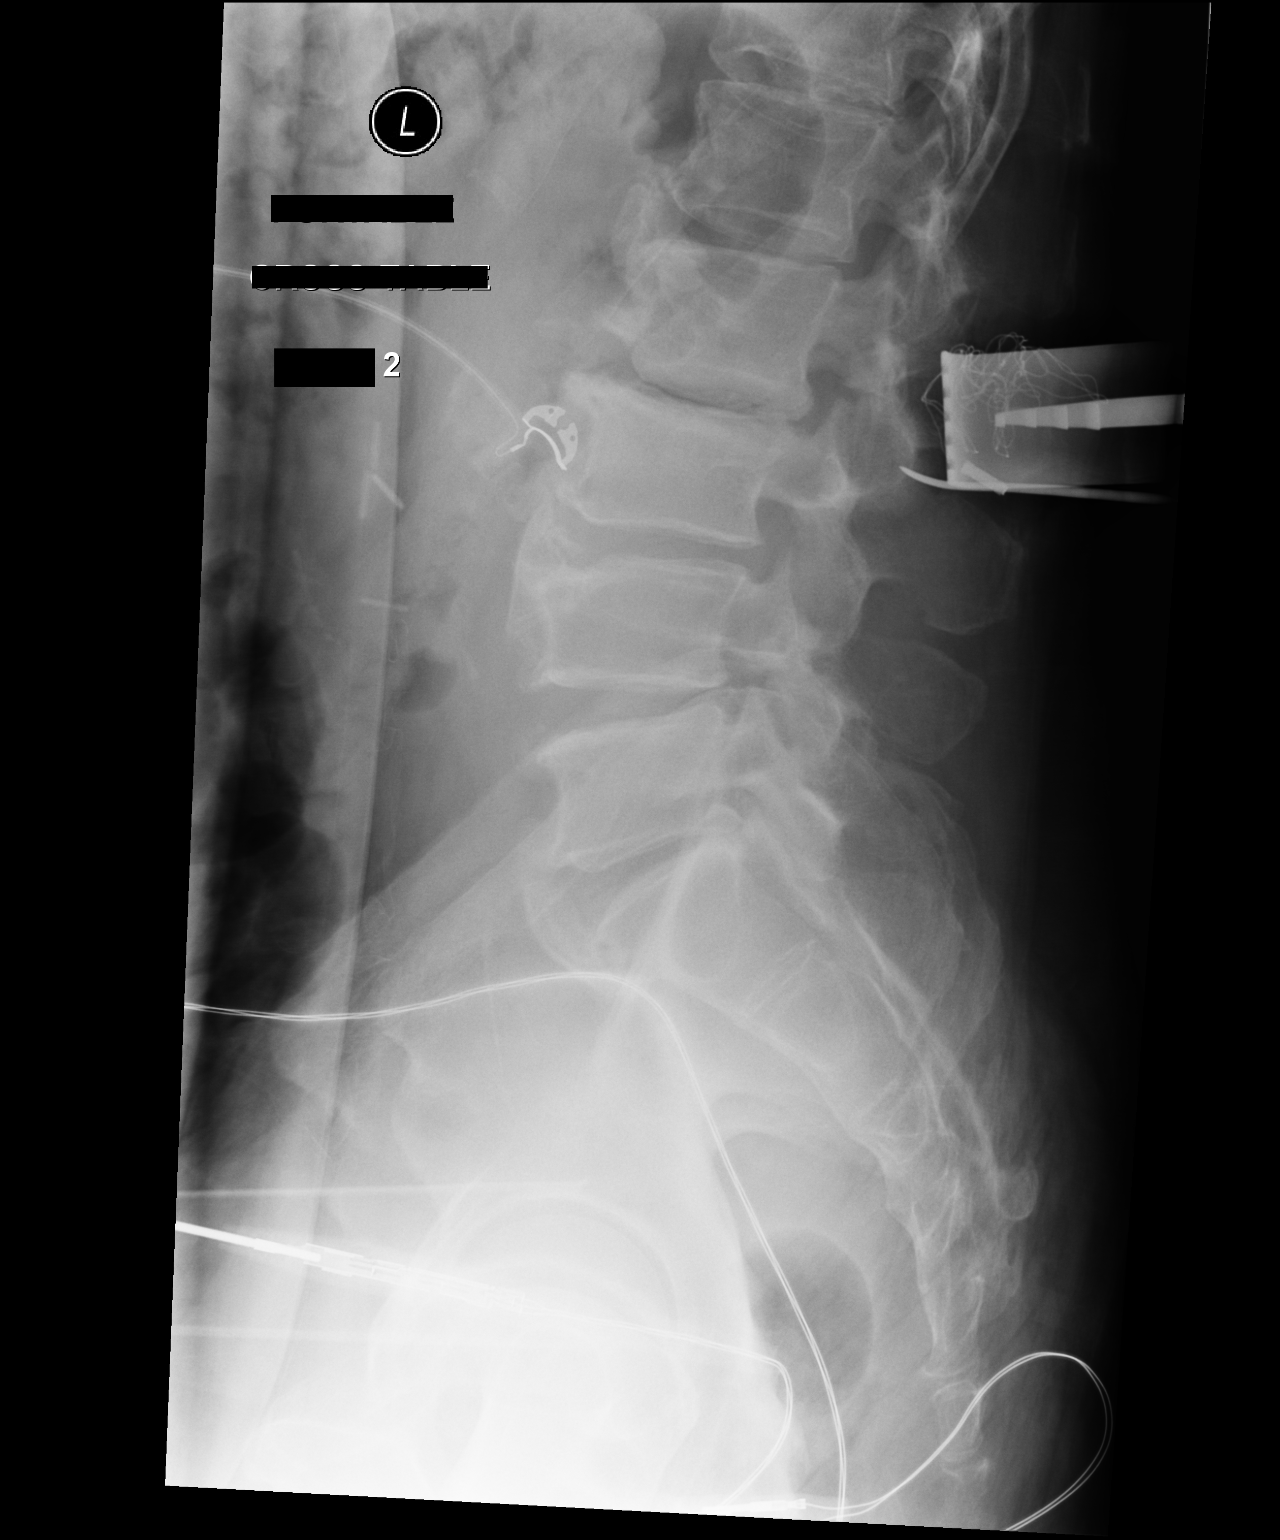

[2 of 2 positions shown; findings below may reference images not displayed]

FINDINGS: Two lateral intraoperative views of the lumbar spine. Image labeled
image number 1 demonstrates linear localizing instrument over the
spinous process of L2. The second image 2 demonstrates surgical
instruments overlying the spinous process of L2 with linear
localizing instrument overlying the posterior elements at the
approximate L2-L3 level.
IMPRESSION: Lateral views of the lumbar spine for intraoperative localization
purposes.

## 2020-10-19 ENCOUNTER — Encounter: Payer: Self-pay | Admitting: Physical Therapy

## 2020-10-21 ENCOUNTER — Ambulatory Visit: Payer: No Typology Code available for payment source | Admitting: Physical Therapy

## 2020-10-28 ENCOUNTER — Ambulatory Visit: Payer: No Typology Code available for payment source | Admitting: Physical Therapy

## 2020-11-04 ENCOUNTER — Ambulatory Visit: Payer: No Typology Code available for payment source | Admitting: Physical Therapy

## 2020-11-04 ENCOUNTER — Encounter: Payer: Self-pay | Admitting: Physical Therapy

## 2020-11-04 ENCOUNTER — Other Ambulatory Visit: Payer: Self-pay

## 2020-11-04 DIAGNOSIS — M79661 Pain in right lower leg: Secondary | ICD-10-CM | POA: Diagnosis not present

## 2020-11-04 DIAGNOSIS — R2689 Other abnormalities of gait and mobility: Secondary | ICD-10-CM

## 2020-11-04 DIAGNOSIS — M6281 Muscle weakness (generalized): Secondary | ICD-10-CM

## 2020-11-04 NOTE — Therapy (Signed)
[facility] Outpatient Rehabilitation [facility] [address] [place], [place], [address] Phone: [phone]   Fax:  [phone]  Physical Therapy Treatment / ERO  Patient Details  Name: Manuel Pearson MRN: [id number] Date of Birth: [birth year] Referring Provider (PT): Dannielle Huh, MD   Encounter Date: 11/04/2020   PT End of Session - 11/04/20 1053     Visit Number 16    Number of Visits 24    Date for PT Re-Evaluation 12/30/20    Authorization Type [facility] Authorized    Authorization - Visit Number 1    Authorization - Number of Visits 15    PT Start Time 1046    PT Stop Time 1130    PT Time Calculation (min) 44 min    Activity Tolerance Patient tolerated treatment well    Behavior During Therapy WFL for tasks assessed/performed             Past Medical History:  Diagnosis Date   Anxiety    Elevated liver enzymes    has had increases due to headache meds, but back to normal    GERD (gastroesophageal reflux disease)    Headache(784.0)    MIGRAINES    Hypertension    Hypothyroidism    PONV (postoperative nausea and vomiting)    DIFFICULTY WAKING UP    PTSD (post-traumatic stress disorder)    Sleep apnea 2019    Past Surgical History:  Procedure Laterality Date   ANTERIOR CERVICAL DECOMP/DISCECTOMY FUSION  03/29/2011   Procedure: ANTERIOR CERVICAL DECOMPRESSION/DISCECTOMY FUSION 2 LEVELS;  Surgeon: Reinaldo Meeker, MD;  Location: [facility];  Service: Neurosurgery;  Laterality: N/A;  CERVICAL FOUR-FIVE, CERVICAL SIX-SEVEN  ANTERIOR CERVICAL DECOMPRESSION FUSION  WITH TRABECULAR METAL  AND PLATE   APPENDECTOMY  03/2017   DISTAL BICEPS TENDON REPAIR  2008   LEFT    LUMBAR LAMINECTOMY/DECOMPRESSION MICRODISCECTOMY Right 09/11/2019   Procedure: Laminectomy and Foraminotomy - Lumbar Two-Three - right;  Surgeon: Donalee Citrin, MD;  Location: [facility] OR;  Service: Neurosurgery;  Laterality: Right;  right   NECK SURGERY  1995   FUSION    SHOULDER SURGERY  2000    RIGHT     LEFT  2004     There were no vitals filed for this visit.   Subjective Assessment - 11/04/20 1050     Subjective Patient reports continued swelling in the right ankle. He has been walking on it a lot more and states continued persistent pain and swelling that he can't get rid of.    Limitations Standing;Walking;Lifting;House hold activities    How long can you stand comfortably? 1 hour    Patient Stated Goals Pt would like to improve mobility and decrease pain in order to get back to yard work    Currently in Pain? Yes    Pain Score 7     Pain Location Leg    Pain Orientation Right;Lower    Pain Descriptors / Indicators Aching;Sore;Throbbing    Pain Type Chronic pain    Pain Onset More than a month ago    Pain Frequency Constant    Aggravating Factors  Walking, standing    Pain Relieving Factors Nothing                [facility] PT Assessment - 11/04/20 0001       Assessment   Medical Diagnosis S/P R Distal fibula fx    Referring Provider (PT) Dannielle Huh, MD      Precautions  Precautions None      Restrictions   Weight Bearing Restrictions No      Balance Screen   Has the patient fallen in the past 6 months No    Has the patient had a decrease in activity level because of a fear of falling?  No    Is the patient reluctant to leave their home because of a fear of falling?  No      Prior Function   Level of Independence Independent    Vocation Retired      Observation/Other Assessments   Observations Patient exhibits increased swelling and redness localized to distal aspect of lateral incision    Focus on Therapeutic Outcomes (FOTO)  53% functional status      AROM   Right Ankle Dorsiflexion 12    Right Ankle Plantar Flexion 50    Right Ankle Inversion 45    Right Ankle Eversion 25      Strength   Right Ankle Dorsiflexion 5/5    Right Ankle Plantar Flexion 4/5    Right Ankle Inversion 5/5    Right Ankle Eversion 4+/5      Ambulation/Gait    Ambulation/Gait Yes    Ambulation/Gait Assistance 6: Modified independent (Device/Increase time)    Assistive device Straight cane    Gait Comments Decreased toe off on right, toe out                           [facility] Adult PT Treatment/Exercise - 11/04/20 0001       Self-Care   Self-Care Other Self-Care Comments    Other Self-Care Comments  POC, HEP, FOTO      Neuro Re-ed    Neuro Re-ed Details  SLS on Airex 3 x 30 sec each      Exercises   Exercises Ankle      Ankle Exercises: Aerobic   Nustep L6 x 5 min with LE only while taking subjective      Ankle Exercises: Standing   Rocker Board 1 minute   2 sets   Rocker Board Limitations fwd/bwd    Heel Raises 15 reps   3 sets   Heel Raises Limitations eccentric on right      Ankle Exercises: Supine   T-Band Inversion/Eversion with blue 2 x 20 each                    PT Education - 11/04/20 1053     Education Details POC udpate, HEP, using compression stocking    Person(s) Educated Patient    Methods Explanation;Demonstration;Verbal cues    Comprehension Verbalized understanding;Returned demonstration;Verbal cues required;Need further instruction              PT Short Term Goals - 09/16/20 1158       PT SHORT TERM GOAL #1   Title Patient will be I with initial HEP to progress with PT    Baseline patient I with initial HEP    Period Weeks    Status Achieved    Target Date 09/13/20      PT SHORT TERM GOAL #2   Title Pt will decrease 5xSTS to no greater than 12 sec with no inc in pain for improve balance and functional ability    Baseline 10 sec    Time 3    Period Weeks    Status Achieved    Target Date 09/13/20  PT Long Term Goals - 11/04/20 1057       PT LONG TERM GOAL #1   Title Patient will be I with final HEP to maintain progress from PT    Baseline Patient continuing to progress toward final HEP    Time 8    Period Weeks    Status On-going    Target  Date 12/30/20      PT LONG TERM GOAL #2   Title Pt will improve R ankle DF to no less than 10 deg in order to improve functional mobility    Baseline right ankle DF 12 deg    Time 8    Period Weeks    Status Achieved      PT LONG TERM GOAL #3   Title Pt will improve TUG to no greater than 15 sec with LRAD in order to improve safety and mobility    Baseline 10 seconds with no AD    Time 8    Period Weeks    Status Achieved      PT LONG TERM GOAL #4   Title Pt will self report R LE pain no greater than 3/10 at worst for improved comfort and function    Baseline patient continues to report higher level of pain, 7/10 this visit    Time 8    Period Weeks    Status On-going    Target Date 12/30/20      PT LONG TERM GOAL #5   Title Patient will demonstrate 5/5 MMT of right ankle to improve tolerance for walking and standing without increase in pain    Baseline 4/5 calf strength    Time 8    Period Weeks    Status On-going    Target Date 12/30/20      Additional Long Term Goals   Additional Long Term Goals Yes      PT LONG TERM GOAL #6   Title Patient will report >/= 54% functional status on FOTO    Baseline 53%    Time 8    Period Weeks    Status On-going    Target Date 12/30/20                   Plan - 11/04/20 1121     Clinical Impression Statement Patient tolerated therapy well with no adverse effects. He demonstrates overall good progress toward his goals but continues to demonstrate increased edema and pain of the right lower leg and ankle with any extended period of standing of walking. His range of motion and strength has improved but does exhibit weakness of the right calf. He reports improvement in overall functional level on FOTO. No change to HEP this visit. Patient would benefit from continued skilled PT to progress strength, improve walking, and reduce pain to maximize functional ability.    PT Frequency 1x / week    PT Duration 8 weeks    PT  Treatment/Interventions ADLs/Self Care Home Management;Electrical Stimulation;Moist Heat;Cryotherapy;DME Instruction;Gait training;Stair training;Functional mobility training;Therapeutic activities;Therapeutic exercise;Balance training;Neuromuscular re-education;Patient/family education;Manual techniques;Vasopneumatic Device    PT Next Visit Plan Review HEP and progress PRN, progress ankle strengthening and balance    PT Home Exercise Plan [phone]    Consulted and Agree with Plan of Care Patient             Patient will benefit from skilled therapeutic intervention in order to improve the following deficits and impairments:  Abnormal gait, Decreased activity tolerance, Decreased balance, Decreased coordination, Decreased  endurance, Decreased range of motion, Decreased strength, Difficulty walking, Impaired flexibility, Pain  Visit Diagnosis: Pain in right lower leg  Muscle weakness (generalized)  Other abnormalities of gait and mobility     Problem List Patient Active Problem List   Diagnosis Date Noted   Hospice care patient 09/12/2019   Spinal stenosis of lumbar region 09/11/2019    Rosana Hoes, PT, DPT, LAT, ATC 11/04/20  11:33 AM Phone: [phone] Fax: [phone]   [facility] Outpatient Rehabilitation [facility] [address] [place], [place], [address] Phone: [phone]   Fax:  [phone]  Name: Manuel Pearson MRN: [id number] Date of Birth: September 21, 1965

## 2020-11-09 ENCOUNTER — Ambulatory Visit: Payer: No Typology Code available for payment source | Admitting: Physical Therapy

## 2020-11-09 ENCOUNTER — Encounter: Payer: Self-pay | Admitting: Physical Therapy

## 2020-11-09 ENCOUNTER — Other Ambulatory Visit: Payer: Self-pay

## 2020-11-09 DIAGNOSIS — M6281 Muscle weakness (generalized): Secondary | ICD-10-CM

## 2020-11-09 DIAGNOSIS — M79661 Pain in right lower leg: Secondary | ICD-10-CM

## 2020-11-09 DIAGNOSIS — R2689 Other abnormalities of gait and mobility: Secondary | ICD-10-CM

## 2020-11-09 NOTE — Therapy (Signed)
[facility] Outpatient Rehabilitation [facility] [address] [place], [place], [address] Phone: [phone]   Fax:  [phone]  Physical Therapy Treatment  Patient Details  Name: Manuel Pearson MRN: [id number] Date of Birth: [birth year] Referring Provider (PT): Dannielle Huh, MD   Encounter Date: 11/09/2020   PT End of Session - 11/09/20 1055     Visit Number 17    Number of Visits 24    Date for PT Re-Evaluation 12/30/20    Authorization Type VA Authorized    Authorization - Visit Number 2    Authorization - Number of Visits 15    PT Start Time 1045    PT Stop Time 1130    PT Time Calculation (min) 45 min    Activity Tolerance Patient tolerated treatment well    Behavior During Therapy WFL for tasks assessed/performed             Past Medical History:  Diagnosis Date   Anxiety    Elevated liver enzymes    has had increases due to headache meds, but back to normal    GERD (gastroesophageal reflux disease)    Headache(784.0)    MIGRAINES    Hypertension    Hypothyroidism    PONV (postoperative nausea and vomiting)    DIFFICULTY WAKING UP    PTSD (post-traumatic stress disorder)    Sleep apnea 2019    Past Surgical History:  Procedure Laterality Date   ANTERIOR CERVICAL DECOMP/DISCECTOMY FUSION  03/29/2011   Procedure: ANTERIOR CERVICAL DECOMPRESSION/DISCECTOMY FUSION 2 LEVELS;  Surgeon: Reinaldo Meeker, MD;  Location: [facility];  Service: Neurosurgery;  Laterality: N/A;  CERVICAL FOUR-FIVE, CERVICAL SIX-SEVEN  ANTERIOR CERVICAL DECOMPRESSION FUSION  WITH TRABECULAR METAL  AND PLATE   APPENDECTOMY  03/2017   DISTAL BICEPS TENDON REPAIR  2008   LEFT    LUMBAR LAMINECTOMY/DECOMPRESSION MICRODISCECTOMY Right 09/11/2019   Procedure: Laminectomy and Foraminotomy - Lumbar Two-Three - right;  Surgeon: Donalee Citrin, MD;  Location: [facility];  Service: Neurosurgery;  Laterality: Right;  right   NECK SURGERY  1995   FUSION    SHOULDER SURGERY  2000   RIGHT      LEFT  2004     There were no vitals filed for this visit.   Subjective Assessment - 11/09/20 1052     Subjective Patient reports he is doing well, feeling better than last visit.    Patient Stated Goals Pt would like to improve mobility and decrease pain in order to get back to yard work    Currently in Pain? Yes    Pain Score 5     Pain Location Leg    Pain Orientation Right;Lower    Pain Descriptors / Indicators Aching;Sore;Throbbing    Pain Type Chronic pain    Pain Onset More than a month ago    Pain Frequency Constant    Aggravating Factors  Walking, standing                [facility] PT Assessment - 11/09/20 0001       Ambulation/Gait   Gait Comments Decreased toe off on right, toe out                           [facility] Adult PT Treatment/Exercise - 11/09/20 0001       Neuro Re-ed    Neuro Re-ed Details  SLS on Airex 3 x 30 sec each  Exercises   Exercises Ankle      Ankle Exercises: Stretches   Slant Board Stretch 3 reps;30 seconds      Ankle Exercises: Aerobic   Nustep L6 x 5 min with LE only while taking subjective      Ankle Exercises: Standing   Rocker Board 1 minute   2 sets   Rocker Board Limitations fwd/bwd without UE support    Heel Raises 15 reps   3 sets   Heel Raises Limitations eccentric on right    Other Standing Ankle Exercises Gait training: focus on toe off at counter      Ankle Exercises: Seated   Heel Raises 20 reps   2 sets   Heel Raises Limitations 30# over knee      Ankle Exercises: Supine   T-Band Inversion/Eversion with blue 2 x 20 each                    PT Education - 11/09/20 1054     Education Details HEP    Person(s) Educated Patient    Methods Explanation;Demonstration;Verbal cues    Comprehension Verbalized understanding;Returned demonstration;Verbal cues required;Need further instruction              PT Short Term Goals - 09/16/20 1158       PT SHORT TERM GOAL #1   Title  Patient will be I with initial HEP to progress with PT    Baseline patient I with initial HEP    Period Weeks    Status Achieved    Target Date 09/13/20      PT SHORT TERM GOAL #2   Title Pt will decrease 5xSTS to no greater than 12 sec with no inc in pain for improve balance and functional ability    Baseline 10 sec    Time 3    Period Weeks    Status Achieved    Target Date 09/13/20               PT Long Term Goals - 11/04/20 1057       PT LONG TERM GOAL #1   Title Patient will be I with final HEP to maintain progress from PT    Baseline Patient continuing to progress toward final HEP    Time 8    Period Weeks    Status On-going    Target Date 12/30/20      PT LONG TERM GOAL #2   Title Pt will improve R ankle DF to no less than 10 deg in order to improve functional mobility    Baseline right ankle DF 12 deg    Time 8    Period Weeks    Status Achieved      PT LONG TERM GOAL #3   Title Pt will improve TUG to no greater than 15 sec with LRAD in order to improve safety and mobility    Baseline 10 seconds with no AD    Time 8    Period Weeks    Status Achieved      PT LONG TERM GOAL #4   Title Pt will self report R LE pain no greater than 3/10 at worst for improved comfort and function    Baseline patient continues to report higher level of pain, 7/10 this visit    Time 8    Period Weeks    Status On-going    Target Date 12/30/20      PT LONG TERM GOAL #5  Title Patient will demonstrate 5/5 MMT of right ankle to improve tolerance for walking and standing without increase in pain    Baseline 4/5 calf strength    Time 8    Period Weeks    Status On-going    Target Date 12/30/20      Additional Long Term Goals   Additional Long Term Goals Yes      PT LONG TERM GOAL #6   Title Patient will report >/= 54% functional status on FOTO    Baseline 53%    Time 8    Period Weeks    Status On-going    Target Date 12/30/20                   Plan -  11/09/20 1109     Clinical Impression Statement Patient tolerated therapy well with no adverse effects. Therapy focused on continued focus primarily on strengthening and ankle control. He did tolerate strengthening better this visit without increase in pain level. He demonstrates improved single leg balance on unstable surface. He continues to demonstrate decreased toe off on right so cued for toe off and perform gait training to emphasize toe toe. Patient would benefit from continued skilled PT to progress strength, improve walking, and reduce pain to maximize functional ability.    PT Treatment/Interventions ADLs/Self Care Home Management;Electrical Stimulation;Moist Heat;Cryotherapy;DME Instruction;Gait training;Stair training;Functional mobility training;Therapeutic activities;Therapeutic exercise;Balance training;Neuromuscular re-education;Patient/family education;Manual techniques;Vasopneumatic Device    PT Next Visit Plan Review HEP and progress PRN, progress ankle strengthening and balance    PT Home Exercise Plan [phone]    Consulted and Agree with Plan of Care Patient             Patient will benefit from skilled therapeutic intervention in order to improve the following deficits and impairments:  Abnormal gait, Decreased activity tolerance, Decreased balance, Decreased coordination, Decreased endurance, Decreased range of motion, Decreased strength, Difficulty walking, Impaired flexibility, Pain  Visit Diagnosis: Pain in right lower leg  Muscle weakness (generalized)  Other abnormalities of gait and mobility     Problem List Patient Active Problem List   Diagnosis Date Noted   Hospice care patient 09/12/2019   Spinal stenosis of lumbar region 09/11/2019    Rosana Hoes, PT, DPT, LAT, ATC 11/09/20  11:34 AM Phone: [phone] Fax: [phone]   [facility] Outpatient Rehabilitation [facility] [address] [place], [place], [address] Phone:  [phone]   Fax:  [phone]  Name: Manuel Pearson MRN: [id number] Date of Birth: 1965-09-10

## 2020-11-30 ENCOUNTER — Encounter: Payer: Self-pay | Admitting: Physical Therapy

## 2020-11-30 ENCOUNTER — Other Ambulatory Visit: Payer: Self-pay

## 2020-11-30 ENCOUNTER — Ambulatory Visit: Payer: No Typology Code available for payment source | Attending: Orthopedic Surgery | Admitting: Physical Therapy

## 2020-11-30 DIAGNOSIS — R2689 Other abnormalities of gait and mobility: Secondary | ICD-10-CM | POA: Diagnosis present

## 2020-11-30 DIAGNOSIS — M79661 Pain in right lower leg: Secondary | ICD-10-CM | POA: Diagnosis not present

## 2020-11-30 DIAGNOSIS — M545 Low back pain, unspecified: Secondary | ICD-10-CM | POA: Diagnosis present

## 2020-11-30 DIAGNOSIS — M6281 Muscle weakness (generalized): Secondary | ICD-10-CM | POA: Insufficient documentation

## 2020-11-30 DIAGNOSIS — G8929 Other chronic pain: Secondary | ICD-10-CM | POA: Insufficient documentation

## 2020-11-30 NOTE — Patient Instructions (Signed)
Access Code: [email or web address] URL: [email or web address] Date: 11/30/2020 Prepared by: Rosana Hoes  Exercises Standing Gastroc Stretch at Counter - 1 x daily - 7 x weekly - 3 reps - 20 hold Ankle Inversion with Resistance - 1 x daily - 3-4 x weekly - 3 sets - 20 reps Long Sitting Ankle Eversion with Resistance - 1 x daily - 3-4 x weekly - 3 sets - 20 reps Standing Heel Raise - 1 x daily - 3-4 x weekly - 3 sets - 10 reps Standing Tandem Balance with Counter Support - 1 x daily - 7 x weekly - 3 reps - 30 hold

## 2020-11-30 NOTE — Therapy (Signed)
[facility] Outpatient Rehabilitation [facility] [address] [place], [place], [address] Phone: [phone]   Fax:  [phone]  Physical Therapy Treatment  Patient Details  Name: Manuel Pearson MRN: [id number] Date of Birth: 1965-10-23 Referring Provider (PT): Dannielle Huh, MD   Encounter Date: 11/30/2020   PT End of Session - 11/30/20 1038     Visit Number 18    Number of Visits 24    Date for PT Re-Evaluation 12/30/20    Authorization Type VA Authorized    Authorization - Visit Number 3    Authorization - Number of Visits 15    PT Start Time 1030    PT Stop Time 1115    PT Time Calculation (min) 45 min    Activity Tolerance Patient tolerated treatment well    Behavior During Therapy WFL for tasks assessed/performed             Past Medical History:  Diagnosis Date   Anxiety    Elevated liver enzymes    has had increases due to headache meds, but back to normal    GERD (gastroesophageal reflux disease)    Headache(784.0)    MIGRAINES    Hypertension    Hypothyroidism    PONV (postoperative nausea and vomiting)    DIFFICULTY WAKING UP    PTSD (post-traumatic stress disorder)    Sleep apnea 2019    Past Surgical History:  Procedure Laterality Date   ANTERIOR CERVICAL DECOMP/DISCECTOMY FUSION  03/29/2011   Procedure: ANTERIOR CERVICAL DECOMPRESSION/DISCECTOMY FUSION 2 LEVELS;  Surgeon: Reinaldo Meeker, MD;  Location: [facility];  Service: Neurosurgery;  Laterality: N/A;  CERVICAL FOUR-FIVE, CERVICAL SIX-SEVEN  ANTERIOR CERVICAL DECOMPRESSION FUSION  WITH TRABECULAR METAL  AND PLATE   APPENDECTOMY  03/2017   DISTAL BICEPS TENDON REPAIR  2008   LEFT    LUMBAR LAMINECTOMY/DECOMPRESSION MICRODISCECTOMY Right 09/11/2019   Procedure: Laminectomy and Foraminotomy - Lumbar Two-Three - right;  Surgeon: Donalee Citrin, MD;  Location: [facility] OR;  Service: Neurosurgery;  Laterality: Right;  right   NECK SURGERY  1995   FUSION    SHOULDER SURGERY  2000   RIGHT      LEFT  2004     There were no vitals filed for this visit.   Subjective Assessment - 11/30/20 1034     Subjective Patient reports that he continues to have swelling of the right ankle and discoloration at the distal end of the incision. Reports continued discomfort with weight bearing activity.    Limitations Standing;Walking;Lifting;House hold activities    Patient Stated Goals Pt would like to improve mobility and decrease pain in order to get back to yard work    Currently in Pain? Yes    Pain Score 7     Pain Location Leg    Pain Orientation Right;Lower    Pain Descriptors / Indicators Aching;Sore;Throbbing    Pain Type Chronic pain    Pain Onset More than a month ago    Pain Frequency Constant    Aggravating Factors  Walking, standing    Pain Relieving Factors Nothing                [facility] PT Assessment - 11/30/20 0001       AROM   Right Ankle Dorsiflexion 12    Right Ankle Plantar Flexion 55    Right Ankle Inversion 45    Right Ankle Eversion 25      Ambulation/Gait   Ambulation/Gait Yes  Ambulation/Gait Assistance 7: Independent    Gait Comments Antalgic on right, decreased toe off                           [facility] Adult PT Treatment/Exercise - 11/30/20 0001       Neuro Re-ed    Neuro Re-ed Details  Tandem stance 3 x 30 sec each      Exercises   Exercises Ankle      Manual Therapy   Manual Therapy Joint mobilization;Passive ROM;Edema management;Soft tissue mobilization    Edema Management Flush massage while right ankle elevated    Joint Mobilization Right talocrural and subtalar mobs grade III-IV all directions    Soft tissue mobilization Right calf    Passive ROM Right ankle all directions      Ankle Exercises: Stretches   Gastroc Stretch 3 reps;20 seconds    Gastroc Stretch Limitations runner stretch      Ankle Exercises: Aerobic   Nustep L6 x 5 min with LE only while taking subjective      Ankle Exercises: Standing   Heel  Raises 10 reps   3 sets     Ankle Exercises: Supine   T-Band Inversion/Eversion with red 3 x 20 each                     PT Education - 11/30/20 1038     Education Details HEP update    Person(s) Educated Patient    Methods Explanation;Demonstration;Verbal cues;Handout    Comprehension Verbalized understanding;Returned demonstration;Verbal cues required;Need further instruction              PT Short Term Goals - 09/16/20 1158       PT SHORT TERM GOAL #1   Title Patient will be I with initial HEP to progress with PT    Baseline patient I with initial HEP    Period Weeks    Status Achieved    Target Date 09/13/20      PT SHORT TERM GOAL #2   Title Pt will decrease 5xSTS to no greater than 12 sec with no inc in pain for improve balance and functional ability    Baseline 10 sec    Time 3    Period Weeks    Status Achieved    Target Date 09/13/20               PT Long Term Goals - 11/30/20 1117       PT LONG TERM GOAL #1   Title Patient will be I with final HEP to maintain progress from PT    Baseline Patient continuing to progress toward final HEP    Time 8    Period Weeks    Status On-going    Target Date 12/30/20      PT LONG TERM GOAL #2   Title Pt will improve R ankle DF to no less than 10 deg in order to improve functional mobility    Baseline right ankle DF 12 deg    Time 8    Period Weeks    Status Achieved      PT LONG TERM GOAL #3   Title Pt will improve TUG to no greater than 15 sec with LRAD in order to improve safety and mobility    Baseline 10 seconds with no AD    Time 8    Period Weeks    Status Achieved  PT LONG TERM GOAL #4   Title Pt will self report R LE pain no greater than 3/10 at worst for improved comfort and function    Baseline patient continues to report higher level of pain, 7/10 this visit    Time 8    Period Weeks    Status On-going    Target Date 12/30/20      PT LONG TERM GOAL #5   Title Patient  will demonstrate 5/5 MMT of right ankle to improve tolerance for walking and standing without increase in pain    Baseline 4/5 calf strength    Time 8    Period Weeks    Status On-going    Target Date 12/30/20      PT LONG TERM GOAL #6   Title Patient will report >/= 54% functional status on FOTO    Baseline 53%    Time 8    Period Weeks    Status On-going    Target Date 12/30/20                   Plan - 11/30/20 1039     Clinical Impression Statement Patient tolerated therapy well with no adverse effects, but does continue to report persistent pain and swelling of the lower leg and ankle. Manual therapy performed to reduce swelling, reduce pain, and improve motion. Overall he demonstrates good range of motion of the ankle and strength, but notes pain with all movement especially eversion. HEP was regressed and he was instructed to perform less frequently to reduce persistent pain and swelling. Patient would benefit from continued skilled PT to progress strength, improve walking, and reduce pain to maximize functional ability.    PT Treatment/Interventions ADLs/Self Care Home Management;Electrical Stimulation;Moist Heat;Cryotherapy;DME Instruction;Gait training;Stair training;Functional mobility training;Therapeutic activities;Therapeutic exercise;Balance training;Neuromuscular re-education;Patient/family education;Manual techniques;Vasopneumatic Device    PT Next Visit Plan Review HEP and progress PRN, progress ankle strengthening and balance    PT Home Exercise Plan [phone]    Consulted and Agree with Plan of Care Patient             Patient will benefit from skilled therapeutic intervention in order to improve the following deficits and impairments:  Abnormal gait, Decreased activity tolerance, Decreased balance, Decreased coordination, Decreased endurance, Decreased range of motion, Decreased strength, Difficulty walking, Impaired flexibility, Pain  Visit  Diagnosis: Pain in right lower leg  Muscle weakness (generalized)  Other abnormalities of gait and mobility  Chronic bilateral low back pain, unspecified whether sciatica present     Problem List Patient Active Problem List   Diagnosis Date Noted   Hospice care patient 09/12/2019   Spinal stenosis of lumbar region 09/11/2019    Rosana Hoes, PT, DPT, LAT, ATC 11/30/20  11:21 AM Phone: [phone] Fax: [phone]   [facility] Outpatient Rehabilitation [facility] [address] [place], [place], [address] Phone: [phone]   Fax:  [phone]  Name: Manuel Pearson MRN: [id number] Date of Birth: Aug 07, 1965

## 2020-12-08 ENCOUNTER — Encounter: Payer: Self-pay | Admitting: Physical Therapy

## 2020-12-08 ENCOUNTER — Ambulatory Visit: Payer: No Typology Code available for payment source | Admitting: Physical Therapy

## 2020-12-08 ENCOUNTER — Other Ambulatory Visit: Payer: Self-pay

## 2020-12-08 DIAGNOSIS — M6281 Muscle weakness (generalized): Secondary | ICD-10-CM

## 2020-12-08 DIAGNOSIS — M79661 Pain in right lower leg: Secondary | ICD-10-CM | POA: Diagnosis not present

## 2020-12-08 DIAGNOSIS — R2689 Other abnormalities of gait and mobility: Secondary | ICD-10-CM

## 2020-12-08 NOTE — Therapy (Signed)
[facility] Outpatient Rehabilitation [facility] [address] [place], [place], [address] Phone: [phone]   Fax:  [phone]  Physical Therapy Treatment  Patient Details  Name: Manuel Pearson MRN: [id number] Date of Birth: [birth year] Referring Provider (PT): Dannielle Huh, MD   Encounter Date: 12/08/2020   PT End of Session - 12/08/20 1058     Visit Number 19    Number of Visits 24    Date for PT Re-Evaluation 12/30/20    Authorization Type VA Authorized    Authorization - Visit Number 4    Authorization - Number of Visits 15    PT Start Time 1045    PT Stop Time 1130    PT Time Calculation (min) 45 min    Activity Tolerance Patient tolerated treatment well    Behavior During Therapy WFL for tasks assessed/performed             Past Medical History:  Diagnosis Date   Anxiety    Elevated liver enzymes    has had increases due to headache meds, but back to normal    GERD (gastroesophageal reflux disease)    Headache(784.0)    MIGRAINES    Hypertension    Hypothyroidism    PONV (postoperative nausea and vomiting)    DIFFICULTY WAKING UP    PTSD (post-traumatic stress disorder)    Sleep apnea 2019    Past Surgical History:  Procedure Laterality Date   ANTERIOR CERVICAL DECOMP/DISCECTOMY FUSION  03/29/2011   Procedure: ANTERIOR CERVICAL DECOMPRESSION/DISCECTOMY FUSION 2 LEVELS;  Surgeon: Reinaldo Meeker, MD;  Location: [facility];  Service: Neurosurgery;  Laterality: N/A;  CERVICAL FOUR-FIVE, CERVICAL SIX-SEVEN  ANTERIOR CERVICAL DECOMPRESSION FUSION  WITH TRABECULAR METAL  AND PLATE   APPENDECTOMY  03/2017   DISTAL BICEPS TENDON REPAIR  2008   LEFT    LUMBAR LAMINECTOMY/DECOMPRESSION MICRODISCECTOMY Right 09/11/2019   Procedure: Laminectomy and Foraminotomy - Lumbar Two-Three - right;  Surgeon: Donalee Citrin, MD;  Location: [facility] OR;  Service: Neurosurgery;  Laterality: Right;  right   NECK SURGERY  1995   FUSION    SHOULDER SURGERY  2000   RIGHT      LEFT  2004     There were no vitals filed for this visit.   Subjective Assessment - 12/08/20 1054     Subjective Patient reports continued ankle pain and swelling. States the swelling and discomfort get worse as the day goes on.    Patient Stated Goals Pt would like to improve mobility and decrease pain in order to get back to yard work    Currently in Pain? Yes    Pain Score 7     Pain Location Leg    Pain Orientation Right;Lower    Pain Descriptors / Indicators Aching;Throbbing;Sore    Pain Type Chronic pain;Surgical pain    Pain Onset More than a month ago    Pain Frequency Constant    Aggravating Factors  Walking, standing                [facility] PT Assessment - 12/08/20 0001       AROM   Right Ankle Dorsiflexion 12      Strength   Right Ankle Dorsiflexion 5/5    Right Ankle Plantar Flexion 4/5    Right Ankle Inversion 5/5    Right Ankle Eversion 4+/5  [facility] Adult PT Treatment/Exercise - 12/08/20 0001       Neuro Re-ed    Neuro Re-ed Details  Tandem stance on Airex 3 x 30 sec each, SLS 3 x 20 sec each   occasional UE support for right SLS     Exercises   Exercises Ankle      Ankle Exercises: Stretches   Slant Board Stretch 3 reps;30 seconds      Ankle Exercises: Aerobic   Nustep L6 x 5 min with LE only while taking subjective      Ankle Exercises: Standing   Rocker Board 1 minute   3 sets   Rocker Board Limitations fwd/bwd without UE support    Heel Raises 15 reps   3 sets     Ankle Exercises: Supine   T-Band Inversion/Eversion with red 3 x 20 each                     PT Education - 12/08/20 1057     Education Details HEP    Person(s) Educated Patient    Methods Explanation;Demonstration;Verbal cues    Comprehension Verbalized understanding;Returned demonstration;Verbal cues required;Need further instruction              PT Short Term Goals - 09/16/20 1158       PT SHORT TERM GOAL #1    Title Patient will be I with initial HEP to progress with PT    Baseline patient I with initial HEP    Period Weeks    Status Achieved    Target Date 09/13/20      PT SHORT TERM GOAL #2   Title Pt will decrease 5xSTS to no greater than 12 sec with no inc in pain for improve balance and functional ability    Baseline 10 sec    Time 3    Period Weeks    Status Achieved    Target Date 09/13/20               PT Long Term Goals - 11/30/20 1117       PT LONG TERM GOAL #1   Title Patient will be I with final HEP to maintain progress from PT    Baseline Patient continuing to progress toward final HEP    Time 8    Period Weeks    Status On-going    Target Date 12/30/20      PT LONG TERM GOAL #2   Title Pt will improve R ankle DF to no less than 10 deg in order to improve functional mobility    Baseline right ankle DF 12 deg    Time 8    Period Weeks    Status Achieved      PT LONG TERM GOAL #3   Title Pt will improve TUG to no greater than 15 sec with LRAD in order to improve safety and mobility    Baseline 10 seconds with no AD    Time 8    Period Weeks    Status Achieved      PT LONG TERM GOAL #4   Title Pt will self report R LE pain no greater than 3/10 at worst for improved comfort and function    Baseline patient continues to report higher level of pain, 7/10 this visit    Time 8    Period Weeks    Status On-going    Target Date 12/30/20      PT LONG TERM GOAL #5  Title Patient will demonstrate 5/5 MMT of right ankle to improve tolerance for walking and standing without increase in pain    Baseline 4/5 calf strength    Time 8    Period Weeks    Status On-going    Target Date 12/30/20      PT LONG TERM GOAL #6   Title Patient will report >/= 54% functional status on FOTO    Baseline 53%    Time 8    Period Weeks    Status On-going    Target Date 12/30/20                   Plan - 12/08/20 1059     Clinical Impression Statement Patient  tolerated therapy well with no adverse effects, but does continue to report persistent pain and swelling of the lower leg and ankle. Therapy focused on continued strengthening and coordination for the right ankle. He does continue to demonstrate balance deficit on the right. He motion and strength are overall good but seems to continue to have poor toelrance for extended activity on the right leg/ankle. Patient would benefit from continued skilled PT to progress strength, improve walking, and reduce pain to maximize functional ability.    PT Treatment/Interventions ADLs/Self Care Home Management;Electrical Stimulation;Moist Heat;Cryotherapy;DME Instruction;Gait training;Stair training;Functional mobility training;Therapeutic activities;Therapeutic exercise;Balance training;Neuromuscular re-education;Patient/family education;Manual techniques;Vasopneumatic Device    PT Next Visit Plan Review HEP and progress PRN, progress ankle strengthening and balance    PT Home Exercise Plan [phone]    Consulted and Agree with Plan of Care Patient             Patient will benefit from skilled therapeutic intervention in order to improve the following deficits and impairments:  Abnormal gait, Decreased activity tolerance, Decreased balance, Decreased coordination, Decreased endurance, Decreased range of motion, Decreased strength, Difficulty walking, Impaired flexibility, Pain  Visit Diagnosis: Pain in right lower leg  Muscle weakness (generalized)  Other abnormalities of gait and mobility     Problem List Patient Active Problem List   Diagnosis Date Noted   Hospice care patient 09/12/2019   Spinal stenosis of lumbar region 09/11/2019    Rosana Hoes, PT, DPT, LAT, ATC 12/08/20  11:30 AM Phone: [phone] Fax: [phone]   [facility] Outpatient Rehabilitation [facility] [address] [place], [place], [address] Phone: [phone]   Fax:  [phone]  Name: SALOMON GANSER MRN: [id number] Date of Birth: May 28, 1965

## 2020-12-14 ENCOUNTER — Other Ambulatory Visit: Payer: Self-pay

## 2020-12-14 ENCOUNTER — Ambulatory Visit: Payer: No Typology Code available for payment source | Attending: Orthopedic Surgery | Admitting: Physical Therapy

## 2020-12-14 ENCOUNTER — Encounter: Payer: Self-pay | Admitting: Physical Therapy

## 2020-12-14 DIAGNOSIS — M79661 Pain in right lower leg: Secondary | ICD-10-CM | POA: Diagnosis present

## 2020-12-14 DIAGNOSIS — M6281 Muscle weakness (generalized): Secondary | ICD-10-CM | POA: Diagnosis present

## 2020-12-14 DIAGNOSIS — R2689 Other abnormalities of gait and mobility: Secondary | ICD-10-CM | POA: Insufficient documentation

## 2020-12-14 NOTE — Therapy (Signed)
[facility] Outpatient Rehabilitation [facility] [address] [place], [place], [address] Phone: [phone]   Fax:  [phone]  Physical Therapy Treatment  Patient Details  Name: Manuel Pearson MRN: [id number] Date of Birth: 1965/10/18 Referring Provider (PT): Dannielle Huh, MD   Encounter Date: 12/14/2020   PT End of Session - 12/14/20 1040     Visit Number 20    Number of Visits 24    Date for PT Re-Evaluation 12/30/20    Authorization Type VA Authorized    Authorization - Visit Number 5    Authorization - Number of Visits 15    PT Start Time 1035    PT Stop Time 1115    PT Time Calculation (min) 40 min    Activity Tolerance Patient tolerated treatment well    Behavior During Therapy WFL for tasks assessed/performed             Past Medical History:  Diagnosis Date   Anxiety    Elevated liver enzymes    has had increases due to headache meds, but back to normal    GERD (gastroesophageal reflux disease)    Headache(784.0)    MIGRAINES    Hypertension    Hypothyroidism    PONV (postoperative nausea and vomiting)    DIFFICULTY WAKING UP    PTSD (post-traumatic stress disorder)    Sleep apnea 2019    Past Surgical History:  Procedure Laterality Date   ANTERIOR CERVICAL DECOMP/DISCECTOMY FUSION  03/29/2011   Procedure: ANTERIOR CERVICAL DECOMPRESSION/DISCECTOMY FUSION 2 LEVELS;  Surgeon: Reinaldo Meeker, MD;  Location: [facility];  Service: Neurosurgery;  Laterality: N/A;  CERVICAL FOUR-FIVE, CERVICAL SIX-SEVEN  ANTERIOR CERVICAL DECOMPRESSION FUSION  WITH TRABECULAR METAL  AND PLATE   APPENDECTOMY  03/2017   DISTAL BICEPS TENDON REPAIR  2008   LEFT    LUMBAR LAMINECTOMY/DECOMPRESSION MICRODISCECTOMY Right 09/11/2019   Procedure: Laminectomy and Foraminotomy - Lumbar Two-Three - right;  Surgeon: Donalee Citrin, MD;  Location: [facility] OR;  Service: Neurosurgery;  Laterality: Right;  right   NECK SURGERY  1995   FUSION    SHOULDER SURGERY  2000   RIGHT      LEFT  2004     There were no vitals filed for this visit.   Subjective Assessment - 12/14/20 1038     Subjective Patient reports he is doing ok, continues to have the right ankle pain and swelling especially with any extended periods. Patient reports that he still uses his cane occasionally due to ankle soreness, which happens a lot toward the end of the day.    Patient Stated Goals Pt would like to improve mobility and decrease pain in order to get back to yard work    Currently in Pain? Yes    Pain Score 6     Pain Location Leg    Pain Orientation Right;Lower    Pain Descriptors / Indicators Aching;Sore    Pain Type Chronic pain;Surgical pain    Pain Onset More than a month ago    Pain Frequency Constant    Aggravating Factors  Walking, standing    Pain Relieving Factors Nothing                [facility] PT Assessment - 12/14/20 0001       AROM   Right Ankle Dorsiflexion 12      Strength   Right Ankle Plantar Flexion 4+/5  [facility] Adult PT Treatment/Exercise:  Therapeutic Exercise: - NuStep L6 x 5 min with LE only - while taking subjective report - Slant board calf stretch 3 x 30 sec - Heel raises 2 x 15 - Longsitting banded eversion/inversion with red 2 x 20 each - Seated heel raises with 25# 2 x 20  Manual Therapy: - NA  Neuromuscular re-ed: - Rockerboard fwd/bwd 2 x 1 min - Tandem stance on Airex 3 x 30 sec each - SLS 3 x 20 sec each - occasional UE support to maintain balance  Therapeutic Activity: - NA  Modalities: - NA           PT Education - 12/14/20 1040     Education Details HEP    Person(s) Educated Patient    Methods Explanation;Demonstration;Verbal cues    Comprehension Verbalized understanding;Returned demonstration;Verbal cues required;Need further instruction              PT Short Term Goals - 09/16/20 1158       PT SHORT TERM GOAL #1   Title Patient will be I with initial HEP to  progress with PT    Baseline patient I with initial HEP    Period Weeks    Status Achieved    Target Date 09/13/20      PT SHORT TERM GOAL #2   Title Pt will decrease 5xSTS to no greater than 12 sec with no inc in pain for improve balance and functional ability    Baseline 10 sec    Time 3    Period Weeks    Status Achieved    Target Date 09/13/20               PT Long Term Goals - 11/30/20 1117       PT LONG TERM GOAL #1   Title Patient will be I with final HEP to maintain progress from PT    Baseline Patient continuing to progress toward final HEP    Time 8    Period Weeks    Status On-going    Target Date 12/30/20      PT LONG TERM GOAL #2   Title Pt will improve R ankle DF to no less than 10 deg in order to improve functional mobility    Baseline right ankle DF 12 deg    Time 8    Period Weeks    Status Achieved      PT LONG TERM GOAL #3   Title Pt will improve TUG to no greater than 15 sec with LRAD in order to improve safety and mobility    Baseline 10 seconds with no AD    Time 8    Period Weeks    Status Achieved      PT LONG TERM GOAL #4   Title Pt will self report R LE pain no greater than 3/10 at worst for improved comfort and function    Baseline patient continues to report higher level of pain, 7/10 this visit    Time 8    Period Weeks    Status On-going    Target Date 12/30/20      PT LONG TERM GOAL #5   Title Patient will demonstrate 5/5 MMT of right ankle to improve tolerance for walking and standing without increase in pain    Baseline 4/5 calf strength    Time 8    Period Weeks    Status On-going    Target Date 12/30/20  PT LONG TERM GOAL #6   Title Patient will report >/= 54% functional status on FOTO    Baseline 53%    Time 8    Period Weeks    Status On-going    Target Date 12/30/20                   Plan - 12/14/20 1041     Clinical Impression Statement Patient tolerated therapy well with no adverse effects,  but does continue to report persistent pain and swelling of the lower leg and ankle with extended periods of activity. He does demonstrate improve ankle strength this visit but continues to exhibit decreased single leg balance on the right. Patient continues to report increased pain with therapy, but states he does get benefit from therapy and exercise. Patient would benefit from continued skilled PT to progress strength, improve walking, and reduce pain to maximize functional ability.    PT Treatment/Interventions ADLs/Self Care Home Management;Electrical Stimulation;Moist Heat;Cryotherapy;DME Instruction;Gait training;Stair training;Functional mobility training;Therapeutic activities;Therapeutic exercise;Balance training;Neuromuscular re-education;Patient/family education;Manual techniques;Vasopneumatic Device    PT Next Visit Plan Review HEP and progress PRN, progress ankle strengthening and balance    PT Home Exercise Plan [phone]    Consulted and Agree with Plan of Care Patient             Patient will benefit from skilled therapeutic intervention in order to improve the following deficits and impairments:  Abnormal gait, Decreased activity tolerance, Decreased balance, Decreased coordination, Decreased endurance, Decreased range of motion, Decreased strength, Difficulty walking, Impaired flexibility, Pain  Visit Diagnosis: Pain in right lower leg  Muscle weakness (generalized)  Other abnormalities of gait and mobility     Problem List Patient Active Problem List   Diagnosis Date Noted   Hospice care patient 09/12/2019   Spinal stenosis of lumbar region 09/11/2019    Rosana Hoes, PT, DPT, LAT, ATC 12/14/20  11:20 AM Phone: [phone] Fax: [phone]   [facility] Outpatient Rehabilitation [facility] [address] [place], [place], [address] Phone: [phone]   Fax:  [phone]  Name: Manuel Pearson MRN: [id number] Date of Birth: [birth year]

## 2020-12-21 ENCOUNTER — Ambulatory Visit: Payer: No Typology Code available for payment source | Admitting: Physical Therapy

## 2020-12-21 ENCOUNTER — Other Ambulatory Visit: Payer: Self-pay

## 2020-12-21 ENCOUNTER — Encounter: Payer: Self-pay | Admitting: Physical Therapy

## 2020-12-21 DIAGNOSIS — R2689 Other abnormalities of gait and mobility: Secondary | ICD-10-CM

## 2020-12-21 DIAGNOSIS — M6281 Muscle weakness (generalized): Secondary | ICD-10-CM

## 2020-12-21 DIAGNOSIS — M79661 Pain in right lower leg: Secondary | ICD-10-CM | POA: Diagnosis not present

## 2020-12-21 NOTE — Therapy (Signed)
[facility] [address] [place], [place], [address] Phone: [phone]   Fax:  [phone]  Physical Therapy Treatment  Patient Details  Name: Manuel Pearson MRN: [id number] Date of Birth: 09-Feb-1966 Referring Provider (PT): Dannielle Huh, MD   Encounter Date: 12/21/2020   PT End of Session - 12/21/20 1045     Visit Number 21    Number of Visits 24    Date for PT Re-Evaluation 12/30/20    Authorization Type VA Authorized    Authorization - Visit Number 6    Authorization - Number of Visits 15    PT Start Time 1040    PT Stop Time 1125    PT Time Calculation (min) 45 min    Activity Tolerance Patient tolerated treatment well    Behavior During Therapy WFL for tasks assessed/performed             Past Medical History:  Diagnosis Date   Anxiety    Elevated liver enzymes    has had increases due to headache meds, but back to normal    GERD (gastroesophageal reflux disease)    Headache(784.0)    MIGRAINES    Hypertension    Hypothyroidism    PONV (postoperative nausea and vomiting)    DIFFICULTY WAKING UP    PTSD (post-traumatic stress disorder)    Sleep apnea 2019    Past Surgical History:  Procedure Laterality Date   ANTERIOR CERVICAL DECOMP/DISCECTOMY FUSION  03/29/2011   Procedure: ANTERIOR CERVICAL DECOMPRESSION/DISCECTOMY FUSION 2 LEVELS;  Surgeon: Reinaldo Meeker, MD;  Location: [facility];  Service: Neurosurgery;  Laterality: N/A;  CERVICAL FOUR-FIVE, CERVICAL SIX-SEVEN  ANTERIOR CERVICAL DECOMPRESSION FUSION  WITH TRABECULAR METAL  AND PLATE   APPENDECTOMY  03/2017   DISTAL BICEPS TENDON REPAIR  2008   LEFT    LUMBAR LAMINECTOMY/DECOMPRESSION MICRODISCECTOMY Right 09/11/2019   Procedure: Laminectomy and Foraminotomy - Lumbar Two-Three - right;  Surgeon: Donalee Citrin, MD;  Location[facility];  Service: Neurosurgery;  Laterality: Right;  right   NECK SURGERY  1995   FUSION    SHOULDER SURGERY  2000   RIGHT      LEFT  2004     There were no vitals filed for this visit.   Subjective Assessment - 12/21/20 1043     Subjective Patient reports he is doing about the same. He has not heard from the ankle specialist to schedule a consult for his persistent lower leg/ankle pain.    Patient Stated Goals Pt would like to improve mobility and decrease pain in order to get back to yard work    Currently in Pain? Yes    Pain Score 6     Pain Location Leg    Pain Orientation Right;Lower    Pain Descriptors / Indicators Aching;Sore    Pain Type Chronic pain;Surgical pain    Pain Onset More than a month ago    Pain Frequency Constant    Aggravating Factors  Walking, standing                [facility] PT Assessment - 12/21/20 0001       Strength   Right Ankle Plantar Flexion 4+/5                 [facility] Adult PT Treatment/Exercise:   Therapeutic Exercise: - NuStep L6 x 5 min with LE only - while taking subjective report - Slant board calf stretch 3 x 30 sec - Eccentric  heel raises on right 3 x 10 - Longsitting banded eversion/inversion with red 2 x 20 each - Leg press SL 35# 3 x 10 each (omega) - Lateral band walk with black at knees 3 x 20 each   Manual Therapy: - NA   Neuromuscular re-ed: - Rockerboard fwd/bwd 2 x 1 min - Tandem stance on Airex 3 x 30 sec each - SLS 3 x 30 sec each - occasional UE support to maintain balance   Therapeutic Activity: - NA   Modalities: - NA                   PT Education - 12/21/20 1044     Education Details HEP    Person(s) Educated Patient    Methods Explanation;Demonstration;Verbal cues    Comprehension Verbalized understanding;Returned demonstration;Verbal cues required;Need further instruction              PT Short Term Goals - 09/16/20 1158       PT SHORT TERM GOAL #1   Title Patient will be I with initial HEP to progress with PT    Baseline patient I with initial HEP    Period Weeks    Status Achieved     Target Date 09/13/20      PT SHORT TERM GOAL #2   Title Pt will decrease 5xSTS to no greater than 12 sec with no inc in pain for improve balance and functional ability    Baseline 10 sec    Time 3    Period Weeks    Status Achieved    Target Date 09/13/20               PT Long Term Goals - 11/30/20 1117       PT LONG TERM GOAL #1   Title Patient will be I with final HEP to maintain progress from PT    Baseline Patient continuing to progress toward final HEP    Time 8    Period Weeks    Status On-going    Target Date 12/30/20      PT LONG TERM GOAL #2   Title Pt will improve R ankle DF to no less than 10 deg in order to improve functional mobility    Baseline right ankle DF 12 deg    Time 8    Period Weeks    Status Achieved      PT LONG TERM GOAL #3   Title Pt will improve TUG to no greater than 15 sec with LRAD in order to improve safety and mobility    Baseline 10 seconds with no AD    Time 8    Period Weeks    Status Achieved      PT LONG TERM GOAL #4   Title Pt will self report R LE pain no greater than 3/10 at worst for improved comfort and function    Baseline patient continues to report higher level of pain, 7/10 this visit    Time 8    Period Weeks    Status On-going    Target Date 12/30/20      PT LONG TERM GOAL #5   Title Patient will demonstrate 5/5 MMT of right ankle to improve tolerance for walking and standing without increase in pain    Baseline 4/5 calf strength    Time 8    Period Weeks    Status On-going    Target Date 12/30/20      PT LONG  TERM GOAL #6   Title Patient will report >/= 54% functional status on FOTO    Baseline 53%    Time 8    Period Weeks    Status On-going    Target Date 12/30/20                   Plan - 12/21/20 1047     Clinical Impression Statement Patient tolerated therapy well with no adverse effects, but does continue to report persistent pain and swelling of the lower leg and ankle with extended  periods of activity. Therapy continued focus on progressing ankle strength and neuromuscular control with balance. He is demonstrating improve ability to perform SL stance with less UE support. No changes made to HEP, patient ecnoruaged to remain consistent with strengthening and balance to improve standing and walking tolerance. Patient would benefit from continued skilled PT to progress strength, improve walking, and reduce pain to maximize functional ability.    PT Treatment/Interventions ADLs/Self Care Home Management;Electrical Stimulation;Moist Heat;Cryotherapy;DME Instruction;Gait training;Stair training;Functional mobility training;Therapeutic activities;Therapeutic exercise;Balance training;Neuromuscular re-education;Patient/family education;Manual techniques;Vasopneumatic Device    PT Next Visit Plan Review HEP and progress PRN, progress ankle strengthening and balance    PT Home Exercise Plan [phone]    Consulted and Agree with Plan of Care Patient             Patient will benefit from skilled therapeutic intervention in order to improve the following deficits and impairments:  Abnormal gait, Decreased activity tolerance, Decreased balance, Decreased coordination, Decreased endurance, Decreased range of motion, Decreased strength, Difficulty walking, Impaired flexibility, Pain  Visit Diagnosis: Pain in right lower leg  Muscle weakness (generalized)  Other abnormalities of gait and mobility     Problem List Patient Active Problem List   Diagnosis Date Noted   Hospice care patient 09/12/2019   Spinal stenosis of lumbar region 09/11/2019    Rosana Hoes, PT, DPT, LAT, ATC 12/21/20  11:28 AM Phone: [phone] Fax: [phone]   [facility] [address] [place], [place], [address] Phone: [phone]   Fax:  [phone]  Name: Manuel Pearson MRN: [id number] Date of Birth: May 31, 1965

## 2020-12-28 ENCOUNTER — Ambulatory Visit: Payer: No Typology Code available for payment source | Admitting: Physical Therapy

## 2020-12-28 ENCOUNTER — Other Ambulatory Visit: Payer: Self-pay

## 2020-12-28 ENCOUNTER — Encounter: Payer: Self-pay | Admitting: Physical Therapy

## 2020-12-28 DIAGNOSIS — M6281 Muscle weakness (generalized): Secondary | ICD-10-CM

## 2020-12-28 DIAGNOSIS — R2689 Other abnormalities of gait and mobility: Secondary | ICD-10-CM

## 2020-12-28 DIAGNOSIS — M79661 Pain in right lower leg: Secondary | ICD-10-CM

## 2020-12-28 NOTE — Therapy (Addendum)
[place], [place], [address] Phone: [phone]   Fax:  [phone]  Physical Therapy Treatment / ERO / Discharge  Patient Details  Name: Manuel Pearson MRN: [id number] Date of Birth: Sep 18, 1965 Referring Provider (PT): Vickey Huger, MD   Encounter Date: 12/28/2020   PT End of Session - 12/28/20 1052     Visit Number 22    Number of Visits 28    Date for PT Re-Evaluation 02/08/21    Authorization Type VA Authorized    Authorization Time Period 10/20/2020 - 02/22/2021    Authorization - Visit Number 7    Authorization - Number of Visits 15    PT Start Time 3557    PT Stop Time 1130    PT Time Calculation (min) 45 min    Activity Tolerance Patient tolerated treatment well    Behavior During Therapy [facility] for tasks assessed/performed             Past Medical History:  Diagnosis Date   Anxiety    Elevated liver enzymes    has had increases due to headache meds, but back to normal    GERD (gastroesophageal reflux disease)    Headache(784.0)    MIGRAINES    Hypertension    Hypothyroidism    PONV (postoperative nausea and vomiting)    DIFFICULTY WAKING UP    PTSD (post-traumatic stress disorder)    Sleep apnea 2019    Past Surgical History:  Procedure Laterality Date   ANTERIOR CERVICAL DECOMP/DISCECTOMY FUSION  03/29/2011   Procedure: ANTERIOR CERVICAL DECOMPRESSION/DISCECTOMY FUSION 2 LEVELS;  Surgeon: Faythe Ghee, MD;  Location: [facility];  Service: Neurosurgery;  Laterality: N/A;  CERVICAL FOUR-FIVE, CERVICAL SIX-SEVEN  ANTERIOR CERVICAL DECOMPRESSION FUSION  WITH TRABECULAR METAL  AND PLATE   APPENDECTOMY  03/2017   DISTAL BICEPS TENDON REPAIR  2008   LEFT    LUMBAR LAMINECTOMY/DECOMPRESSION MICRODISCECTOMY Right 09/11/2019   Procedure: Laminectomy and Foraminotomy - Lumbar Two-Three - right;  Surgeon: Kary Kos, MD;  Location: [place];  Service: Neurosurgery;  Laterality: Right;  right    [place]   RIGHT     LEFT  2004     There were no vitals filed for this visit.   Subjective Assessment - 12/28/20 1049     Subjective Patient reports continues to do about the same. He did get out in the yard this past weekend which caused some soreness.    Patient Stated Goals Pt would like to improve mobility and decrease pain in order to get back to yard work    Currently in Pain? Yes    Pain Score 5     Pain Location Leg    Pain Orientation Right;Lower    Pain Descriptors / Indicators Aching;Sore    Pain Type Chronic pain;Surgical pain    Pain Onset More than a month ago    Pain Frequency Constant    Aggravating Factors  Walking, standing                [facility] PT Assessment - 12/28/20 0001       Assessment   Medical Diagnosis S/P R Distal fibula fx    Referring Provider (PT) Vickey Huger, MD      Precautions   Precautions None      Restrictions   Weight Bearing Restrictions No      Balance Screen  Has the patient fallen in the past 6 months No      Prior Function   Level of Independence Independent    Vocation Retired      Observation/Other Assessments   Focus on Therapeutic Outcomes (FOTO)  55% functional status      Functional Tests   Functional tests Single leg stance      Single Leg Stance   Comments Patient exhibits increased sway and occasional UE support on right      AROM   Right Ankle Dorsiflexion 12    Right Ankle Plantar Flexion 55    Right Ankle Inversion 45    Right Ankle Eversion 25      Strength   Right Ankle Dorsiflexion 5/5    Right Ankle Plantar Flexion 4+/5    Right Ankle Inversion 5/5    Right Ankle Eversion 4+/5      Ambulation/Gait   Ambulation/Gait Yes    Ambulation/Gait Assistance 7: Independent    Gait Comments Slightly antalgic with improved toe off on right                     [facility] Adult PT Treatment/Exercise:   Therapeutic Exercise: - NuStep L6 x  5 min with LE only - while taking subjective report - Slant board calf stretch 3 x 30 sec - Eccentric heel raises on right 3 x 10 - SL heel raises on left 2 x 15 - Leg press SL 35# 3 x 15 bilat (omega)  - Longsitting banded eversion with green 2 x 20 each - Seated heel raise with 30# 2 x 20 (right only)  Manual Therapy: - NA   Neuromuscular re-ed: - Rockerboard fwd/bwd 2 x 1 min - without UE support - Tandem stance on Airex 3 x 30 sec bilat - SLS 2 x 30 sec bilat, on Airex x 30 sec bilat - occasional UE support to maintain balance   Therapeutic Activity: - NA   Modalities: - NA               PT Education - 12/28/20 1052     Education Details HEP    Person(s) Educated Patient    Methods Explanation;Demonstration;Verbal cues    Comprehension Verbalized understanding;Returned demonstration;Verbal cues required;Need further instruction              PT Short Term Goals - 09/16/20 1158       PT SHORT TERM GOAL #1   Title Patient will be I with initial HEP to progress with PT    Baseline patient I with initial HEP    Period Weeks    Status Achieved    Target Date 09/13/20      PT SHORT TERM GOAL #2   Title Pt will decrease 5xSTS to no greater than 12 sec with no inc in pain for improve balance and functional ability    Baseline 10 sec    Time 3    Period Weeks    Status Achieved    Target Date 09/13/20               PT Long Term Goals - 12/28/20 1056       PT LONG TERM GOAL #1   Title Patient will be I with final HEP to maintain progress from PT    Baseline Patient continuing to progress toward final HEP    Time 6    Period Weeks    Status On-going    Target Date  02/08/21      PT LONG TERM GOAL #2   Title Pt will improve R ankle DF to no less than 10 deg in order to improve functional mobility    Baseline right ankle DF 12 deg    Status Achieved      PT LONG TERM GOAL #3   Title Pt will improve TUG to no greater than 15 sec with LRAD  in order to improve safety and mobility    Baseline 10 seconds with no AD    Status Achieved      PT LONG TERM GOAL #4   Title Pt will self report R LE pain no greater than 3/10 at worst for improved comfort and function    Baseline patient continues to report higher level of pain with activity, 5-6/10 this visit    Time 6    Period Weeks    Status On-going    Target Date 02/08/21      PT LONG TERM GOAL #5   Title Patient will demonstrate 5/5 MMT of right ankle to improve tolerance for walking and standing without increase in pain    Baseline 4+/5 MMT PF and evrsion strength    Time 6    Period Weeks    Status On-going    Target Date 02/08/21      PT LONG TERM GOAL #6   Title Patient will report >/= 54% functional status on FOTO    Baseline 55% functional status    Time --    Period --    Status Achieved    Target Date --                   Plan - 12/28/20 1054     Clinical Impression Statement Patient tolerated therapy well with no adverse effects, but does continue to report persistent pain and swelling of the lower leg and ankle with extended periods of activity. He continues to demonstrate overall good range of motion and is progressing with his strength but exhibits weakness of the right ankle compared to the right. Patient also continues to exhibit deficit of single leg balance on control on the right compared to left. He did report improved functional status on FOTO and has achieved the typical result prediction. Patient would benefit from continued skilled PT to progress strength, improve walking, and reduce pain to maximize functional ability.    PT Frequency 1x / week    PT Duration 6 weeks    PT Treatment/Interventions ADLs/Self Care Home Management;Electrical Stimulation;Moist Heat;Cryotherapy;DME Instruction;Gait training;Stair training;Functional mobility training;Therapeutic activities;Therapeutic exercise;Balance training;Neuromuscular  re-education;Patient/family education;Manual techniques;Vasopneumatic Device    PT Next Visit Plan Review HEP and progress PRN, progress ankle strengthening and balance    PT Home Exercise Plan [phone]    Consulted and Agree with Plan of Care Patient             Patient will benefit from skilled therapeutic intervention in order to improve the following deficits and impairments:  Abnormal gait, Decreased activity tolerance, Decreased balance, Decreased coordination, Decreased endurance, Decreased range of motion, Decreased strength, Difficulty walking, Impaired flexibility, Pain  Visit Diagnosis: Pain in right lower leg  Muscle weakness (generalized)  Other abnormalities of gait and mobility     Problem List Patient Active Problem List   Diagnosis Date Noted   Hospice care patient 09/12/2019   Spinal stenosis of lumbar region 09/11/2019    Hilda Blades, PT, DPT, LAT, ATC 12/28/20  11:34 AM Phone: [phone] Fax:  [facility] [address] [place], [place], [address] Phone: [phone]   Fax:  [phone]  Name: Manuel Pearson MRN: [id number] Date of Birth: 1965/04/06   PHYSICAL THERAPY DISCHARGE SUMMARY  Visits from Start of Care: 22  Current functional level related to goals / functional outcomes: See above   Remaining deficits: See above   Education / Equipment: HEP   Patient agrees to discharge. Patient goals were partially met. Patient is being discharged due to not returning since the last visit.

## 2021-01-11 ENCOUNTER — Ambulatory Visit: Payer: No Typology Code available for payment source | Admitting: Physical Therapy

## 2021-01-25 ENCOUNTER — Ambulatory Visit (INDEPENDENT_AMBULATORY_CARE_PROVIDER_SITE_OTHER): Payer: No Typology Code available for payment source

## 2021-01-25 ENCOUNTER — Encounter: Payer: Self-pay | Admitting: Orthopedic Surgery

## 2021-01-25 ENCOUNTER — Ambulatory Visit (INDEPENDENT_AMBULATORY_CARE_PROVIDER_SITE_OTHER): Payer: No Typology Code available for payment source | Admitting: Orthopedic Surgery

## 2021-01-25 DIAGNOSIS — M25571 Pain in right ankle and joints of right foot: Secondary | ICD-10-CM | POA: Diagnosis not present

## 2021-01-25 DIAGNOSIS — M12571 Traumatic arthropathy, right ankle and foot: Secondary | ICD-10-CM | POA: Diagnosis not present

## 2021-01-25 MED ORDER — LIDOCAINE HCL 1 % IJ SOLN
2.0000 mL | INTRAMUSCULAR | Status: AC | PRN
  Administered 2021-01-25: 2 mL

## 2021-01-25 MED ORDER — METHYLPREDNISOLONE ACETATE 40 MG/ML IJ SUSP
40.0000 mg | INTRAMUSCULAR | Status: AC | PRN
  Administered 2021-01-25: 40 mg via INTRA_ARTICULAR

## 2021-01-25 NOTE — Progress Notes (Signed)
Office Visit Note   Patient: Manuel Pearson           Date of Birth: 02-10-1966           MRN: [id number] Visit Date: 01/25/2021              Requested by: No referring provider defined for this encounter. PCP: Patient, No Pcp Per (Inactive)  Chief Complaint  Patient presents with   Right Ankle - Follow-up      HPI: Patient is a 55 year old gentleman who is seen for initial evaluation for traumatic arthritis right ankle.  Patient is status post open reduction internal fixation of a Weber C fibular fracture and syndesmotic fixation in February approximately 9 months ago.  Surgery was in [place].  Patient recently has followed up with Dr. Valentina Gu and is seen today for initial evaluation.  Patient has been going to physical therapy at [facility].  Patient states he still has swelling and global pain around the ankle.  Assessment & Plan: Visit Diagnoses:  1. Pain in right ankle and joints of right foot   2. Traumatic arthritis of right ankle     Plan: The ankle was injected he tolerated this well reevaluate in 4 weeks.  Discussed that patient may benefit from arthroscopic debridement of the ankle.  We will determine from the results of the injection.  Follow-Up Instructions: Return in about 4 weeks (around 02/22/2021).   Ortho Exam  Patient is alert, oriented, no adenopathy, well-dressed, normal affect, normal respiratory effort. Examination patient has a good dorsalis pedis pulse he is tender to palpation of the anterior and posterior aspect of the ankle.  No pain with subtalar motion he has good range of motion of the ankle.  There is no redness no cellulitis patient does have an effusion of the ankle.  Imaging: XR Ankle Complete Right  Result Date: 01/25/2021 Three-view radiographs of the right ankle shows a congruent mortise with internal fixation for a Weber C fibular fracture and syndesmotic fixation.  There is avulsion of the tip of the medial malleolus as well as  subcondylar cysts.  There is no posterior malleolar fracture.  No images are attached to the encounter.  Labs: Lab Results  Component Value Date   REPTSTATUS 05/26/2013 FINAL 05/24/2013   CULT  05/24/2013    ESCHERICHIA COLI Performed at [facility]   LABORGA ESCHERICHIA COLI 05/24/2013     Lab Results  Component Value Date   ALBUMIN 3.6 03/29/2011    No results found for: MG No results found for: VD25OH  No results found for: PREALBUMIN CBC EXTENDED Latest Ref Rng & Units 09/10/2019  WBC 4.0 - 10.5 K/uL 6.5  RBC 4.22 - 5.81 MIL/uL 5.06  HGB 13.0 - 17.0 g/dL 96.2  HCT 83.6 - 62.9 % 46.5  PLT 150 - 400 K/uL 313     There is no height or weight on file to calculate BMI.  Orders:  Orders Placed This Encounter  Procedures   XR Ankle Complete Right   No orders of the defined types were placed in this encounter.    Procedures: Medium Joint Inj: R ankle on 01/25/2021 11:42 AM Indications: pain and diagnostic evaluation Details: 22 G 1.5 in needle, anteromedial approach Medications: 2 mL lidocaine 1 %; 40 mg methylPREDNISolone acetate 40 MG/ML Outcome: tolerated well, no immediate complications Procedure, treatment alternatives, risks and benefits explained, specific risks discussed. Consent was given by the patient. Immediately prior to procedure a  time out was called to verify the correct patient, procedure, equipment, support staff and site/side marked as required. Patient was prepped and draped in the usual sterile fashion.     Clinical Data: No additional findings.  ROS:  All other systems negative, except as noted in the HPI. Review of Systems  Objective: Vital Signs: There were no vitals taken for this visit.  Specialty Comments:  No specialty comments available.  PMFS History: Patient Active Problem List   Diagnosis Date Noted   Hospice care patient 09/12/2019   Spinal stenosis of lumbar region 09/11/2019   Past Medical History:   Diagnosis Date   Anxiety    Elevated liver enzymes    has had increases due to headache meds, but back to normal    GERD (gastroesophageal reflux disease)    Headache(784.0)    MIGRAINES    Hypertension    Hypothyroidism    PONV (postoperative nausea and vomiting)    DIFFICULTY WAKING UP    PTSD (post-traumatic stress disorder)    Sleep apnea 2019    History reviewed. No pertinent family history.  Past Surgical History:  Procedure Laterality Date   ANTERIOR CERVICAL DECOMP/DISCECTOMY FUSION  03/29/2011   Procedure: ANTERIOR CERVICAL DECOMPRESSION/DISCECTOMY FUSION 2 LEVELS;  Surgeon: Reinaldo Meeker, MD;  Location: [facility];  Service: Neurosurgery;  Laterality: N/A;  CERVICAL FOUR-FIVE, CERVICAL SIX-SEVEN  ANTERIOR CERVICAL DECOMPRESSION FUSION  WITH TRABECULAR METAL  AND PLATE   APPENDECTOMY  03/2017   DISTAL BICEPS TENDON REPAIR  2008   LEFT    LUMBAR LAMINECTOMY/DECOMPRESSION MICRODISCECTOMY Right 09/11/2019   Procedure: Laminectomy and Foraminotomy - Lumbar Two-Three - right;  Surgeon: Donalee Citrin, MD;  Location: [facility];  Service: Neurosurgery;  Laterality: Right;  right   NECK SURGERY  1995   FUSION    SHOULDER SURGERY  2000   RIGHT     LEFT  2004    Social History   Occupational History   Not on file  Tobacco Use   Smoking status: Every Day    Packs/day: 1.00    Years: 40.00    Pack years: 40.00    Types: Cigarettes   Smokeless tobacco: Never  Vaping Use   Vaping Use: Former  Substance and Sexual Activity   Alcohol use: Yes    Comment: occasionally   Drug use: Yes    Types: Marijuana    Comment: occasionally   Sexual activity: Yes

## 2021-02-22 ENCOUNTER — Ambulatory Visit (INDEPENDENT_AMBULATORY_CARE_PROVIDER_SITE_OTHER): Payer: No Typology Code available for payment source | Admitting: Orthopedic Surgery

## 2021-02-22 ENCOUNTER — Encounter: Payer: Self-pay | Admitting: Orthopedic Surgery

## 2021-02-22 ENCOUNTER — Other Ambulatory Visit: Payer: Self-pay

## 2021-02-22 DIAGNOSIS — M25571 Pain in right ankle and joints of right foot: Secondary | ICD-10-CM

## 2021-02-22 DIAGNOSIS — M12571 Traumatic arthropathy, right ankle and foot: Secondary | ICD-10-CM | POA: Diagnosis not present

## 2021-02-22 NOTE — Progress Notes (Signed)
Office Visit Note   Patient: Manuel Pearson           Date of Birth: 07-Dec-1965           MRN: [id number] Visit Date: 02/22/2021              Requested by: No referring provider defined for this encounter. PCP: Patient, No Pcp Per (Inactive)  Chief Complaint  Patient presents with   Right Ankle - Follow-up    S/p injection 01/25/21      HPI: Patient is a 55 year old gentleman who is seen in follow-up status post intra-articular injection for the right ankle for traumatic arthritis status post open reduction internal fixation Weber C fibular fracture and syndesmotic fixation.  Patient initially had placement of an external fixator.  Patient's complaint at this time are across the anterior joint.  Patient states he had some relief with the swelling from the injection but little relief of pain.  Assessment & Plan: Visit Diagnoses:  1. Pain in right ankle and joints of right foot   2. Traumatic arthritis of right ankle     Plan: We will plan for right ankle arthroscopy with debridement of the synovitis and debridement of any osteochondral defects.  Discussed that if his pain is primarily from the synovitis his symptoms should be approximately 75% better.  Discussed that if there are osteochondral changes that his pain relief may be less.  Plan for outpatient surgery at [facility].  Follow-Up Instructions: Return in about 2 weeks (around 03/08/2021) for Follow-up 2 weeks after ankle arthroscopy.Gaylord Shih Exam  Patient is alert, oriented, no adenopathy, well-dressed, normal affect, normal respiratory effort. Patient has a good dorsalis pedis pulse patient is tender to palpation anteriorly ankle he still has swelling around the joint.  There is no cellulitis no signs of infection.  Patient states initially he had 4 fractures.  Radiograph shows internal fixation for the Weber C fibular fracture a syndesmotic screw for stabilization of the syndesmosis with calcification across the syndesmosis  and lucency around the syndesmotic screw.  Patient's joint is congruent and he does have changes consistent with periarticular cysts.  Imaging: No results found. No images are attached to the encounter.  Labs: Lab Results  Component Value Date   REPTSTATUS 05/26/2013 FINAL 05/24/2013   CULT  05/24/2013    ESCHERICHIA COLI Performed at [facility]   LABORGA ESCHERICHIA COLI 05/24/2013     Lab Results  Component Value Date   ALBUMIN 3.6 03/29/2011    No results found for: MG No results found for: VD25OH  No results found for: PREALBUMIN CBC EXTENDED Latest Ref Rng & Units 09/10/2019  WBC 4.0 - 10.5 K/uL 6.5  RBC 4.22 - 5.81 MIL/uL 5.06  HGB 13.0 - 17.0 g/dL 62.6  HCT 94.8 - 54.6 % 46.5  PLT 150 - 400 K/uL 313     There is no height or weight on file to calculate BMI.  Orders:  No orders of the defined types were placed in this encounter.  No orders of the defined types were placed in this encounter.    Procedures: No procedures performed  Clinical Data: No additional findings.  ROS:  All other systems negative, except as noted in the HPI. Review of Systems  Objective: Vital Signs: There were no vitals taken for this visit.  Specialty Comments:  No specialty comments available.  PMFS History: Patient Active Problem List   Diagnosis Date Noted   Hospice care patient  09/12/2019   Spinal stenosis of lumbar region 09/11/2019   Past Medical History:  Diagnosis Date   Anxiety    Elevated liver enzymes    has had increases due to headache meds, but back to normal    GERD (gastroesophageal reflux disease)    Headache(784.0)    MIGRAINES    Hypertension    Hypothyroidism    PONV (postoperative nausea and vomiting)    DIFFICULTY WAKING UP    PTSD (post-traumatic stress disorder)    Sleep apnea 2019    History reviewed. No pertinent family history.  Past Surgical History:  Procedure Laterality Date   ANTERIOR CERVICAL DECOMP/DISCECTOMY  FUSION  03/29/2011   Procedure: ANTERIOR CERVICAL DECOMPRESSION/DISCECTOMY FUSION 2 LEVELS;  Surgeon: Reinaldo Meeker, MD;  Location: [facility];  Service: Neurosurgery;  Laterality: N/A;  CERVICAL FOUR-FIVE, CERVICAL SIX-SEVEN  ANTERIOR CERVICAL DECOMPRESSION FUSION  WITH TRABECULAR METAL  AND PLATE   APPENDECTOMY  03/2017   DISTAL BICEPS TENDON REPAIR  2008   LEFT    LUMBAR LAMINECTOMY/DECOMPRESSION MICRODISCECTOMY Right 09/11/2019   Procedure: Laminectomy and Foraminotomy - Lumbar Two-Three - right;  Surgeon: Donalee Citrin, MD;  Location: [facility] OR;  Service: Neurosurgery;  Laterality: Right;  right   NECK SURGERY  1995   FUSION    SHOULDER SURGERY  2000   RIGHT     LEFT  2004    Social History   Occupational History   Not on file  Tobacco Use   Smoking status: Every Day    Packs/day: 1.00    Years: 40.00    Pack years: 40.00    Types: Cigarettes   Smokeless tobacco: Never  Vaping Use   Vaping Use: Former  Substance and Sexual Activity   Alcohol use: Yes    Comment: occasionally   Drug use: Yes    Types: Marijuana    Comment: occasionally   Sexual activity: Yes

## 2021-04-02 ENCOUNTER — Other Ambulatory Visit: Payer: Self-pay

## 2021-04-02 ENCOUNTER — Encounter (HOSPITAL_BASED_OUTPATIENT_CLINIC_OR_DEPARTMENT_OTHER): Payer: Self-pay | Admitting: Orthopedic Surgery

## 2021-04-09 ENCOUNTER — Encounter (HOSPITAL_BASED_OUTPATIENT_CLINIC_OR_DEPARTMENT_OTHER)
Admission: RE | Admit: 2021-04-09 | Discharge: 2021-04-09 | Disposition: A | Discharge status: Home / Self Care | Payer: Medicare Other | Source: Ambulatory Visit | Attending: Orthopedic Surgery | Admitting: Orthopedic Surgery

## 2021-04-09 DIAGNOSIS — S82831S Other fracture of upper and lower end of right fibula, sequela: Secondary | ICD-10-CM | POA: Diagnosis not present

## 2021-04-09 DIAGNOSIS — X58XXXS Exposure to other specified factors, sequela: Secondary | ICD-10-CM | POA: Diagnosis not present

## 2021-04-09 DIAGNOSIS — I10 Essential (primary) hypertension: Secondary | ICD-10-CM | POA: Diagnosis not present

## 2021-04-09 DIAGNOSIS — Z01810 Encounter for preprocedural cardiovascular examination: Secondary | ICD-10-CM | POA: Insufficient documentation

## 2021-04-09 DIAGNOSIS — G4730 Sleep apnea, unspecified: Secondary | ICD-10-CM | POA: Diagnosis not present

## 2021-04-09 DIAGNOSIS — K219 Gastro-esophageal reflux disease without esophagitis: Secondary | ICD-10-CM | POA: Diagnosis not present

## 2021-04-09 DIAGNOSIS — Z6834 Body mass index (BMI) 34.0-34.9, adult: Secondary | ICD-10-CM | POA: Diagnosis not present

## 2021-04-09 DIAGNOSIS — E039 Hypothyroidism, unspecified: Secondary | ICD-10-CM | POA: Diagnosis not present

## 2021-04-09 DIAGNOSIS — Z79899 Other long term (current) drug therapy: Secondary | ICD-10-CM | POA: Diagnosis not present

## 2021-04-09 DIAGNOSIS — M12571 Traumatic arthropathy, right ankle and foot: Secondary | ICD-10-CM | POA: Diagnosis present

## 2021-04-09 DIAGNOSIS — F17210 Nicotine dependence, cigarettes, uncomplicated: Secondary | ICD-10-CM | POA: Diagnosis not present

## 2021-04-09 DIAGNOSIS — E669 Obesity, unspecified: Secondary | ICD-10-CM | POA: Diagnosis not present

## 2021-04-09 DIAGNOSIS — R569 Unspecified convulsions: Secondary | ICD-10-CM | POA: Diagnosis not present

## 2021-04-09 DIAGNOSIS — F419 Anxiety disorder, unspecified: Secondary | ICD-10-CM | POA: Diagnosis not present

## 2021-04-09 DIAGNOSIS — F4310 Post-traumatic stress disorder, unspecified: Secondary | ICD-10-CM | POA: Diagnosis not present

## 2021-04-09 LAB — BASIC METABOLIC PANEL
Anion gap: 7 (ref 5–15)
BUN: 10 mg/dL (ref 6–20)
CO2: 25 mmol/L (ref 22–32)
Calcium: 9.3 mg/dL (ref 8.9–10.3)
Chloride: 101 mmol/L (ref 98–111)
Creatinine, Ser: 0.93 mg/dL (ref 0.61–1.24)
GFR, Estimated: >60 mL/min (ref >60)
Glucose, Bld: 126 mg/dL — ABNORMAL HIGH (ref 70–99)
Potassium: 4.3 mmol/L (ref 3.5–5.1)
Sodium: 133 mmol/L — ABNORMAL LOW (ref 135–145)

## 2021-04-09 NOTE — Progress Notes (Signed)

## 2021-04-13 ENCOUNTER — Ambulatory Visit (HOSPITAL_BASED_OUTPATIENT_CLINIC_OR_DEPARTMENT_OTHER)
Admission: RE | Admit: 2021-04-13 | Discharge: 2021-04-13 | Disposition: A | Discharge status: Home / Self Care | Payer: No Typology Code available for payment source | Attending: Orthopedic Surgery | Admitting: Orthopedic Surgery

## 2021-04-13 ENCOUNTER — Other Ambulatory Visit: Payer: Self-pay

## 2021-04-13 ENCOUNTER — Ambulatory Visit (HOSPITAL_BASED_OUTPATIENT_CLINIC_OR_DEPARTMENT_OTHER): Payer: No Typology Code available for payment source | Admitting: Anesthesiology

## 2021-04-13 ENCOUNTER — Encounter (HOSPITAL_BASED_OUTPATIENT_CLINIC_OR_DEPARTMENT_OTHER)
Admission: RE | Disposition: A | Discharge status: Home / Self Care | Payer: Self-pay | Source: Home / Self Care | Attending: Orthopedic Surgery

## 2021-04-13 ENCOUNTER — Encounter (HOSPITAL_BASED_OUTPATIENT_CLINIC_OR_DEPARTMENT_OTHER): Payer: Self-pay | Admitting: Orthopedic Surgery

## 2021-04-13 DIAGNOSIS — F419 Anxiety disorder, unspecified: Secondary | ICD-10-CM | POA: Insufficient documentation

## 2021-04-13 DIAGNOSIS — X58XXXS Exposure to other specified factors, sequela: Secondary | ICD-10-CM | POA: Insufficient documentation

## 2021-04-13 DIAGNOSIS — F4310 Post-traumatic stress disorder, unspecified: Secondary | ICD-10-CM | POA: Insufficient documentation

## 2021-04-13 DIAGNOSIS — Z6834 Body mass index (BMI) 34.0-34.9, adult: Secondary | ICD-10-CM | POA: Insufficient documentation

## 2021-04-13 DIAGNOSIS — S82831S Other fracture of upper and lower end of right fibula, sequela: Secondary | ICD-10-CM | POA: Diagnosis not present

## 2021-04-13 DIAGNOSIS — M12571 Traumatic arthropathy, right ankle and foot: Secondary | ICD-10-CM

## 2021-04-13 DIAGNOSIS — Z79899 Other long term (current) drug therapy: Secondary | ICD-10-CM | POA: Insufficient documentation

## 2021-04-13 DIAGNOSIS — I10 Essential (primary) hypertension: Secondary | ICD-10-CM | POA: Insufficient documentation

## 2021-04-13 DIAGNOSIS — E039 Hypothyroidism, unspecified: Secondary | ICD-10-CM | POA: Insufficient documentation

## 2021-04-13 DIAGNOSIS — M25871 Other specified joint disorders, right ankle and foot: Secondary | ICD-10-CM

## 2021-04-13 DIAGNOSIS — K219 Gastro-esophageal reflux disease without esophagitis: Secondary | ICD-10-CM | POA: Insufficient documentation

## 2021-04-13 DIAGNOSIS — E669 Obesity, unspecified: Secondary | ICD-10-CM | POA: Insufficient documentation

## 2021-04-13 DIAGNOSIS — G4730 Sleep apnea, unspecified: Secondary | ICD-10-CM | POA: Diagnosis not present

## 2021-04-13 DIAGNOSIS — R569 Unspecified convulsions: Secondary | ICD-10-CM | POA: Insufficient documentation

## 2021-04-13 DIAGNOSIS — F17210 Nicotine dependence, cigarettes, uncomplicated: Secondary | ICD-10-CM | POA: Insufficient documentation

## 2021-04-13 HISTORY — PX: ANKLE ARTHROSCOPY: SHX545

## 2021-04-13 HISTORY — DX: Unspecified convulsions: R56.9

## 2021-04-13 SURGERY — ARTHROSCOPY, ANKLE
Anesthesia: General | Site: Ankle | Laterality: Right

## 2021-04-13 MED ORDER — MIDAZOLAM HCL 2 MG/2ML IJ SOLN
INTRAMUSCULAR | Status: AC
  Filled 2021-04-13: qty 2

## 2021-04-13 MED ORDER — EPHEDRINE 5 MG/ML INJ
INTRAVENOUS | Status: AC
  Filled 2021-04-13: qty 5

## 2021-04-13 MED ORDER — LIDOCAINE 2% (20 MG/ML) 5 ML SYRINGE
INTRAMUSCULAR | Status: DC | PRN
  Administered 2021-04-13: 100 mg via INTRAVENOUS

## 2021-04-13 MED ORDER — BUPIVACAINE HCL (PF) 0.25 % IJ SOLN
INTRAMUSCULAR | Status: DC | PRN
  Administered 2021-04-13: 10 mL

## 2021-04-13 MED ORDER — KETOROLAC TROMETHAMINE 30 MG/ML IJ SOLN
INTRAMUSCULAR | Status: DC | PRN
  Administered 2021-04-13: 30 mg via INTRAVENOUS

## 2021-04-13 MED ORDER — ONDANSETRON HCL 4 MG/2ML IJ SOLN
INTRAMUSCULAR | Status: DC | PRN
  Administered 2021-04-13: 4 mg via INTRAVENOUS

## 2021-04-13 MED ORDER — DEXMEDETOMIDINE (PRECEDEX) IN NS 20 MCG/5ML (4 MCG/ML) IV SYRINGE
PREFILLED_SYRINGE | INTRAVENOUS | Status: DC | PRN
  Administered 2021-04-13 (×4): 4 ug via INTRAVENOUS

## 2021-04-13 MED ORDER — LACTATED RINGERS IV SOLN: INTRAVENOUS | Status: DC

## 2021-04-13 MED ORDER — CEFAZOLIN SODIUM-DEXTROSE 2-4 GM/100ML-% IV SOLN
INTRAVENOUS | Status: AC
  Filled 2021-04-13: qty 100

## 2021-04-13 MED ORDER — FENTANYL CITRATE (PF) 100 MCG/2ML IJ SOLN
INTRAMUSCULAR | Status: DC | PRN
  Administered 2021-04-13 (×4): 50 ug via INTRAVENOUS

## 2021-04-13 MED ORDER — FENTANYL CITRATE (PF) 100 MCG/2ML IJ SOLN
INTRAMUSCULAR | Status: AC
  Filled 2021-04-13: qty 2

## 2021-04-13 MED ORDER — AMMONIA AROMATIC IN INHA
RESPIRATORY_TRACT | Status: AC
  Filled 2021-04-13: qty 10

## 2021-04-13 MED ORDER — FENTANYL CITRATE (PF) 100 MCG/2ML IJ SOLN: 25.0000 ug | INTRAMUSCULAR | Status: DC | PRN

## 2021-04-13 MED ORDER — ACETAMINOPHEN 10 MG/ML IV SOLN
INTRAVENOUS | Status: DC | PRN
Start: 2021-04-13 — End: 2021-04-13
  Administered 2021-04-13: 1000 mg via INTRAVENOUS

## 2021-04-13 MED ORDER — KETOROLAC TROMETHAMINE 30 MG/ML IJ SOLN
INTRAMUSCULAR | Status: AC
  Filled 2021-04-13: qty 1

## 2021-04-13 MED ORDER — PROPOFOL 500 MG/50ML IV EMUL
INTRAVENOUS | Status: AC
  Filled 2021-04-13: qty 50

## 2021-04-13 MED ORDER — OXYCODONE HCL 5 MG PO TABS
ORAL_TABLET | ORAL | Status: AC
  Filled 2021-04-13: qty 1

## 2021-04-13 MED ORDER — SODIUM CHLORIDE 0.9 % IR SOLN
Status: DC | PRN
  Administered 2021-04-13: 4000 mL

## 2021-04-13 MED ORDER — ONDANSETRON HCL 4 MG/2ML IJ SOLN
INTRAMUSCULAR | Status: AC
  Filled 2021-04-13: qty 2

## 2021-04-13 MED ORDER — ACETAMINOPHEN 325 MG PO TABS: 325.0000 mg | ORAL_TABLET | ORAL | Status: DC | PRN

## 2021-04-13 MED ORDER — MEPERIDINE HCL 25 MG/ML IJ SOLN: 6.2500 mg | INTRAMUSCULAR | Status: DC | PRN

## 2021-04-13 MED ORDER — EPHEDRINE SULFATE (PRESSORS) 50 MG/ML IJ SOLN
INTRAMUSCULAR | Status: DC | PRN
Start: 2021-04-13 — End: 2021-04-13
  Administered 2021-04-13: 10 mg via INTRAVENOUS
  Administered 2021-04-13: 5 mg via INTRAVENOUS
  Administered 2021-04-13 (×2): 10 mg via INTRAVENOUS

## 2021-04-13 MED ORDER — HYDROCODONE-ACETAMINOPHEN 5-325 MG PO TABS: 1.0000 | ORAL_TABLET | ORAL | 0 refills | Status: DC | PRN

## 2021-04-13 MED ORDER — ACETAMINOPHEN 10 MG/ML IV SOLN
INTRAVENOUS | Status: AC
  Filled 2021-04-13: qty 100

## 2021-04-13 MED ORDER — LIDOCAINE 2% (20 MG/ML) 5 ML SYRINGE
INTRAMUSCULAR | Status: AC
  Filled 2021-04-13: qty 5

## 2021-04-13 MED ORDER — DEXMEDETOMIDINE HCL IN NACL 80 MCG/20ML IV SOLN
INTRAVENOUS | Status: AC
  Filled 2021-04-13: qty 20

## 2021-04-13 MED ORDER — OXYCODONE HCL 5 MG PO TABS
5.0000 mg | ORAL_TABLET | Freq: Once | ORAL | Status: AC | PRN
  Administered 2021-04-13: 5 mg via ORAL

## 2021-04-13 MED ORDER — DEXAMETHASONE SODIUM PHOSPHATE 4 MG/ML IJ SOLN
INTRAMUSCULAR | Status: DC | PRN
  Administered 2021-04-13: 10 mg via INTRAVENOUS

## 2021-04-13 MED ORDER — OXYCODONE HCL 5 MG/5ML PO SOLN: 5.0000 mg | Freq: Once | ORAL | Status: AC | PRN

## 2021-04-13 MED ORDER — PROPOFOL 10 MG/ML IV BOLUS
INTRAVENOUS | Status: AC
  Filled 2021-04-13: qty 20

## 2021-04-13 MED ORDER — PROPOFOL 500 MG/50ML IV EMUL
INTRAVENOUS | Status: DC | PRN
  Administered 2021-04-13: 50 mg via INTRAVENOUS
  Administered 2021-04-13 (×2): 200 ug/kg/min via INTRAVENOUS

## 2021-04-13 MED ORDER — CEFAZOLIN SODIUM-DEXTROSE 2-4 GM/100ML-% IV SOLN
2.0000 g | INTRAVENOUS | Status: AC
  Administered 2021-04-13: 2 g via INTRAVENOUS

## 2021-04-13 MED ORDER — OXYCODONE-ACETAMINOPHEN 5-325 MG PO TABS: 1.0000 | ORAL_TABLET | ORAL | 0 refills | Status: DC | PRN

## 2021-04-13 MED ORDER — DEXAMETHASONE SODIUM PHOSPHATE 10 MG/ML IJ SOLN
INTRAMUSCULAR | Status: AC
  Filled 2021-04-13: qty 1

## 2021-04-13 MED ORDER — ACETAMINOPHEN 160 MG/5ML PO SOLN: 325.0000 mg | ORAL | Status: DC | PRN

## 2021-04-13 MED ORDER — ONDANSETRON HCL 4 MG/2ML IJ SOLN: 4.0000 mg | Freq: Once | INTRAMUSCULAR | Status: DC | PRN

## 2021-04-13 MED ORDER — MIDAZOLAM HCL 5 MG/5ML IJ SOLN
INTRAMUSCULAR | Status: DC | PRN
  Administered 2021-04-13: 2 mg via INTRAVENOUS

## 2021-04-13 MED ORDER — PROPOFOL 10 MG/ML IV BOLUS
INTRAVENOUS | Status: DC | PRN
  Administered 2021-04-13: 200 mg via INTRAVENOUS
  Administered 2021-04-13: 50 mg via INTRAVENOUS

## 2021-04-13 SURGICAL SUPPLY — 29 items
BNDG CMPR 9X4 STRL LF SNTH (GAUZE/BANDAGES/DRESSINGS)
BNDG COHESIVE 4X5 TAN ST LF (GAUZE/BANDAGES/DRESSINGS) IMPLANT
BNDG COHESIVE 6X5 TAN ST LF (GAUZE/BANDAGES/DRESSINGS) ×2 IMPLANT
BNDG ESMARK 4X9 LF (GAUZE/BANDAGES/DRESSINGS) IMPLANT
BNDG GAUZE ELAST 4 BULKY (GAUZE/BANDAGES/DRESSINGS) IMPLANT
DISSECTOR 4.0MM X 13CM (MISCELLANEOUS) ×2 IMPLANT
DRAPE ARTHROSCOPY W/POUCH 90 (DRAPES) ×2 IMPLANT
DRAPE OEC MINIVIEW 54X84 (DRAPES) IMPLANT
DRAPE U-SHAPE 47X51 STRL (DRAPES) ×2 IMPLANT
DRSG EMULSION OIL 3X3 NADH (GAUZE/BANDAGES/DRESSINGS) ×2 IMPLANT
DURAPREP 26ML APPLICATOR (WOUND CARE) ×2 IMPLANT
EXCALIBUR 3.8MM X 13CM (MISCELLANEOUS) ×2 IMPLANT
GAUZE SPONGE 4X4 12PLY STRL (GAUZE/BANDAGES/DRESSINGS) ×2 IMPLANT
GLOVE SURG ORTHO LTX SZ9 (GLOVE) ×2 IMPLANT
GLOVE SURG UNDER POLY LF SZ9 (GLOVE) ×2 IMPLANT
GOWN STRL REUS W/ TWL LRG LVL3 (GOWN DISPOSABLE) ×1 IMPLANT
GOWN STRL REUS W/ TWL XL LVL3 (GOWN DISPOSABLE) ×1 IMPLANT
GOWN STRL REUS W/TWL LRG LVL3 (GOWN DISPOSABLE) ×2
GOWN STRL REUS W/TWL XL LVL3 (GOWN DISPOSABLE) ×2
MANIFOLD NEPTUNE II (INSTRUMENTS) ×2 IMPLANT
PACK ARTHROSCOPY DSU (CUSTOM PROCEDURE TRAY) ×2 IMPLANT
PACK BASIN DAY SURGERY FS (CUSTOM PROCEDURE TRAY) ×2 IMPLANT
PROBE APOLLO 90XL (SURGICAL WAND) ×2 IMPLANT
SLEEVE SCD COMPRESS KNEE MED (STOCKING) IMPLANT
STRAP ANKLE FOOT DISTRACTOR (ORTHOPEDIC SUPPLIES) IMPLANT
SUT ETHILON 2 0 FSLX (SUTURE) ×2 IMPLANT
TOWEL GREEN STERILE FF (TOWEL DISPOSABLE) ×2 IMPLANT
TUBING ARTHROSCOPY IRRIG 16FT (MISCELLANEOUS) ×2 IMPLANT
WATER STERILE IRR 1000ML POUR (IV SOLUTION) ×2 IMPLANT

## 2021-04-13 NOTE — H&P (Signed)
Manuel Pearson is an 56 y.o. male.   Chief Complaint: Traumatic arthritis pain right ankle HPI: Patient is a 56 year old gentleman who is seen in follow-up status post intra-articular injection for the right ankle for traumatic arthritis status post open reduction internal fixation Weber C fibular fracture and syndesmotic fixation.  Patient initially had placement of an external fixator.  Patient's complaint at this time are across the anterior joint.  Patient states he had some relief with the swelling from the injection but little relief of pain.    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   Sleep apnea 2019   uses CPAP nightly    Past Surgical History:  Procedure Laterality Date   ANKLE FRACTURE SURGERY Right 04/2020   ANTERIOR CERVICAL DECOMP/DISCECTOMY FUSION  03/29/2011   Procedure: ANTERIOR CERVICAL DECOMPRESSION/DISCECTOMY FUSION 2 LEVELS;  Surgeon: Reinaldo Meeker, MD;  Location: [facility];  Service: Neurosurgery;  Laterality: N/A;  CERVICAL FOUR-FIVE, CERVICAL SIX-SEVEN  ANTERIOR CERVICAL DECOMPRESSION FUSION  WITH TRABECULAR METAL  AND PLATE   APPENDECTOMY  03/2017   DISTAL BICEPS TENDON REPAIR  2008   LEFT    LUMBAR LAMINECTOMY/DECOMPRESSION MICRODISCECTOMY Right 09/11/2019   Procedure: Laminectomy and Foraminotomy - Lumbar Two-Three - right;  Surgeon: Donalee Citrin, MD;  Location: [facility] OR;  Service: Neurosurgery;  Laterality: Right;  right   NECK SURGERY  1995   FUSION    SHOULDER SURGERY  2000   RIGHT     LEFT  2004     History reviewed. No pertinent family history. Social  History:  reports that he has been smoking cigarettes. He has a 20.00 pack-year smoking history. He has never used smokeless tobacco. He reports current alcohol use. He reports current drug use. Drug: Marijuana.  Allergies:  Allergies  Allergen Reactions   Topamax [Topiramate]     Touretts symptoms    Medications Prior to Admission  Medication Sig Dispense Refill   butalbital-acetaminophen-caffeine (FIORICET) 50-325-40 MG tablet Take 1 tablet by mouth every 6 (six) hours as needed for migraine.      cyclobenzaprine (FLEXERIL) 10 MG tablet Take 10 mg by mouth at bedtime.     Erenumab-aooe (AIMOVIG) 70 MG/ML SOAJ Inject into the skin every 30 (thirty) days.     levETIRAcetam (KEPPRA) 750 MG tablet Take 750 mg by mouth 4 (four) times daily.     levothyroxine (SYNTHROID, LEVOTHROID) 50 MCG tablet Take 50 mcg by mouth daily before breakfast.     lisinopril (ZESTRIL) 20 MG tablet Take 10 mg by mouth daily.     loratadine (CLARITIN) 10 MG tablet Take 10 mg by mouth daily at 12 noon.     Multiple Vitamin (MULITIVITAMIN WITH MINERALS) TABS Take 1 tablet by mouth daily.      omeprazole (PRILOSEC) 20 MG capsule Take 20 mg by mouth daily.     OXcarbazepine (TRILEPTAL) 150 MG tablet Take 225 mg by mouth See admin instructions. PATIENT TAKES 225MG  TID AND 450MG  AT BEDTIME  prazosin (MINIPRESS) 2 MG capsule Take 2 mg by mouth at bedtime.      propranolol (INNOPRAN XL) 120 MG 24 hr capsule Take 120 mg by mouth in the morning and at bedtime.     rizatriptan (MAXALT) 10 MG tablet Take 10 mg by mouth as needed for migraine. May repeat in 2 hours if needed     Saw Palmetto 450 MG CAPS Take 450 mg by mouth in the morning, at noon, in the evening, and at bedtime.     sodium chloride 1 g tablet Take 1 g by mouth 2 (two) times daily with a meal.     traZODone (DESYREL) 150 MG tablet Take by mouth at bedtime.      No results found for this or any previous visit (from the past 48 hour(s)). No results  found.  Review of Systems  All other systems reviewed and are negative.  Blood pressure (!) 145/92, pulse 69, temperature 97.6 F (36.4 C), temperature source Oral, resp. rate 18, height 5' 7.5" (1.715 m), weight 102.2 kg, SpO2 98 %. Physical Exam  Patient is alert, oriented, no adenopathy, well-dressed, normal affect, normal respiratory effort. Patient has a good dorsalis pedis pulse patient is tender to palpation anteriorly ankle he still has swelling around the joint.  There is no cellulitis no signs of infection.  Patient states initially he had 4 fractures.  Radiograph shows internal fixation for the Weber C fibular fracture a syndesmotic screw for stabilization of the syndesmosis with calcification across the syndesmosis and lucency around the syndesmotic screw.  Patient's joint is congruent and he does have changes consistent with periarticular cysts. Assessment/Plan 1. Pain in right ankle and joints of right foot   2. Traumatic arthritis of right ankle       Plan: We will plan for right ankle arthroscopy with debridement of the synovitis and debridement of any osteochondral defects.  Discussed that if his pain is primarily from the synovitis his symptoms should be approximately 75% better.  Discussed that if there are osteochondral changes that his pain relief may be less.  Nadara Mustard, MD 04/13/2021, 6:50 AM

## 2021-04-13 NOTE — Interval H&P Note (Signed)
History and Physical Interval Note:  04/13/2021 8:27 AM  Manuel Pearson  has presented today for surgery, with the diagnosis of Traumatic Arthritis Right Ankle.  The various methods of treatment have been discussed with the patient and family. After consideration of risks, benefits and other options for treatment, the patient has consented to  Procedure(s): RIGHT ANKLE ARTHROSCOPY AND DEBRIDEMENT (Right) as a surgical intervention.  The patient's history has been reviewed, patient examined, no change in status, stable for surgery.  I have reviewed the patient's chart and labs.  Questions were answered to the patient's satisfaction.     [name]

## 2021-04-13 NOTE — Transfer of Care (Signed)
Immediate Anesthesia Transfer of Care Note  Patient: Manuel Pearson [facility]  Procedure(s) Performed: RIGHT ANKLE ARTHROSCOPY AND DEBRIDEMENT (Right: Ankle)  Patient Location: PACU  Anesthesia Type:General  Level of Consciousness: drowsy  Airway & Oxygen Therapy: Patient Spontanous Breathing and Patient connected to face mask oxygen  Post-op Assessment: Report given to RN and Post -op Vital signs reviewed and stable  Post vital signs: Reviewed and stable  Last Vitals:  Vitals Value Taken Time  BP 83/55 04/13/21 0946  Temp    Pulse 63 04/13/21 0947  Resp 15 04/13/21 0947  SpO2 96 % 04/13/21 0947  Vitals shown include unvalidated device data.  Last Pain:  Vitals:   04/13/21 0647  TempSrc: Oral  PainSc: 5          Complications: No notable events documented.

## 2021-04-13 NOTE — Anesthesia Postprocedure Evaluation (Signed)
Anesthesia Post Note  Patient: Mitul Kuefler [facility]  Procedure(s) Performed: RIGHT ANKLE ARTHROSCOPY AND DEBRIDEMENT (Right: Ankle)     Patient location during evaluation: PACU Anesthesia Type: General Level of consciousness: awake and alert Pain management: pain level controlled Vital Signs Assessment: post-procedure vital signs reviewed and stable Respiratory status: spontaneous breathing, nonlabored ventilation, respiratory function stable and patient connected to nasal cannula oxygen Cardiovascular status: blood pressure returned to baseline and stable Postop Assessment: no apparent nausea or vomiting Anesthetic complications: no   No notable events documented.  Last Vitals:  Vitals:   04/13/21 1021 04/13/21 1030  BP:  119/85  Pulse: 70 65  Resp: 18 13  Temp:    SpO2: 92% 94%    Last Pain:  Vitals:   04/13/21 1030  TempSrc:   PainSc: 0-No pain        RLE Motor Response: Purposeful movement (04/13/21 1030)        Alysha Doolan

## 2021-04-13 NOTE — Discharge Instructions (Signed)
No Tylenol until 3:15 today if needed. No Ibuprofen until 3:30 today if needed.    Post Anesthesia Home Care Instructions  Activity: Get plenty of rest for the remainder of the day. A responsible individual must stay with you for 24 hours following the procedure.  For the next 24 hours, DO NOT: -Drive a car -Advertising copywriter -Drink alcoholic beverages -Take any medication unless instructed by your physician -Make any legal decisions or sign important papers.  Meals: Start with liquid foods such as gelatin or soup. Progress to regular foods as tolerated. Avoid greasy, spicy, heavy foods. If nausea and/or vomiting occur, drink only clear liquids until the nausea and/or vomiting subsides. Call your physician if vomiting continues.  Special Instructions/Symptoms: Your throat may feel dry or sore from the anesthesia or the breathing tube placed in your throat during surgery. If this causes discomfort, gargle with warm salt water. The discomfort should disappear within 24 hours.  If you had a scopolamine patch placed behind your ear for the management of post- operative nausea and/or vomiting:  1. The medication in the patch is effective for 72 hours, after which it should be removed.  Wrap patch in a tissue and discard in the trash. Wash hands thoroughly with soap and water. 2. You may remove the patch earlier than 72 hours if you experience unpleasant side effects which may include dry mouth, dizziness or visual disturbances. 3. Avoid touching the patch. Wash your hands with soap and water after contact with the patch.

## 2021-04-13 NOTE — Op Note (Signed)
04/13/2021  9:42 AM  PATIENT:  Manuel Pearson    PRE-OPERATIVE DIAGNOSIS:  Traumatic Arthritis Right Ankle  POST-OPERATIVE DIAGNOSIS:  Same  PROCEDURE:  RIGHT ANKLE ARTHROSCOPY AND DEBRIDEMENT  SURGEON:  Nadara Mustard, MD  PHYSICIAN ASSISTANT:None ANESTHESIA:   General  PREOPERATIVE INDICATIONS:  Manuel Pearson is a  56 y.o. male with a diagnosis of Traumatic Arthritis Right Ankle who failed conservative measures and elected for surgical management.    The risks benefits and alternatives were discussed with the patient preoperatively including but not limited to the risks of infection, bleeding, nerve injury, cardiopulmonary complications, the need for revision surgery, among others, and the patient was willing to proceed.  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  OPERATIVE PROCEDURE: Patient was brought the operating room and underwent a general anesthetic.  After adequate levels anesthesia were obtained patient's right lower extremity was prepped using DuraPrep draped into a sterile field a timeout was called.  The scope was inserted through the anterior medial portal and anterior lateral working portal was established.  The skin was incised with 11 blade knife blunt dissection was carried down to the capsule and blunt trochars were used inserted into the joint.  Initial examination showed there to be dense adhesive scar tissue.  Using the shaver and the electrical wand this was debrided away to achieve visualization of the tibial talar joint.  Patient had osteochondral defects of the talar dome with delaminating cartilage this was debrided back to healthy viable margins.  The medial and lateral gutters were cleansed anteriorly bone spurs were resected.  Electrocautery was then used for hemostasis.  A survey of all compartments was then again  performed and there is no further osteochondral defects or loose bodies.  The instruments were removed the portals were closed using 2-0 nylon.  The joint was infused with 10 cc of quarter percent Marcaine plain.  Patient was extubated taken to PACU in stable condition a sterile dressing was applied.   DISCHARGE PLANNING:  Antibiotic duration: Preoperative antibiotics  Weightbearing: Weightbearing as tolerated  Pain medication: Prescription for Vicodin  Dressing care/ Wound VAC: Change dressing in 2 days  Ambulatory devices: Crutches  Discharge to: Home  Follow-up: In the office 1 week post operative.

## 2021-04-13 NOTE — Progress Notes (Signed)
Patient states that he already has crutches at home as well as a walker but will only use the walker as he doesn't "feel stable using the crutches."

## 2021-04-13 NOTE — Anesthesia Preprocedure Evaluation (Addendum)
Anesthesia Evaluation  Patient identified by MRN, date of birth, ID band Patient awake    Reviewed: Allergy & Precautions, NPO status , Patient's Chart, lab work & pertinent test results  History of Anesthesia Complications (+) PONV and history of anesthetic complications  Airway Mallampati: II  TM Distance: >3 FB Neck ROM: Full   Comment: Hx 2 neck surgeries Dental no notable dental hx. (+) Teeth Intact, Dental Advisory Given, Missing,    Pulmonary sleep apnea and Continuous Positive Airway Pressure Ventilation , Current Smoker,  40 pack year history- 1ppd   Pulmonary exam normal breath sounds clear to auscultation       Cardiovascular hypertension, Pt. on medications and Pt. on home beta blockers Normal cardiovascular exam Rhythm:Regular Rate:Normal     Neuro/Psych  Headaches, Seizures -, Well Controlled,  PSYCHIATRIC DISORDERS (PTSD) Anxiety    GI/Hepatic Neg liver ROS, GERD  Medicated and Controlled,  Endo/Other  Hypothyroidism Obesity BMI 36  Renal/GU negative Renal ROS  negative genitourinary   Musculoskeletal Lumbar stenosis   Abdominal (+) + obese,   Peds  Hematology negative hematology ROS (+)   Anesthesia Other Findings   Reproductive/Obstetrics negative OB ROS                            Anesthesia Physical  Anesthesia Plan  ASA: 3  Anesthesia Plan: General   Post-op Pain Management: Ofirmev IV (intra-op) and Toradol IV (intra-op)   Induction: Intravenous  PONV Risk Score and Plan: 2 and Ondansetron, Dexamethasone, Treatment may vary due to age or medical condition and TIVA  Airway Management Planned: LMA  Additional Equipment: None  Intra-op Plan:   Post-operative Plan: Extubation in OR  Informed Consent: I have reviewed the patients History and Physical, chart, labs and discussed the procedure including the risks, benefits and alternatives for the proposed  anesthesia with the patient or authorized representative who has indicated his/her understanding and acceptance.     Dental advisory given  Plan Discussed with: CRNA and Anesthesiologist  Anesthesia Plan Comments:        Anesthesia Quick Evaluation

## 2021-04-13 NOTE — Anesthesia Procedure Notes (Signed)
Procedure Name: LMA Insertion Date/Time: 04/13/2021 8:57 AM Performed by: Alford Highland, CRNA Pre-anesthesia Checklist: Patient identified, Emergency Drugs available, Suction available and Patient being monitored Patient Re-evaluated:Patient Re-evaluated prior to induction Oxygen Delivery Method: Circle System Utilized Preoxygenation: Pre-oxygenation with 100% oxygen Induction Type: IV induction Ventilation: Mask ventilation without difficulty LMA: LMA inserted LMA Size: 5.0 Number of attempts: 1 Airway Equipment and Method: Bite block Placement Confirmation: positive ETCO2 Tube secured with: Tape Dental Injury: Teeth and Oropharynx as per pre-operative assessment

## 2021-04-14 ENCOUNTER — Encounter (HOSPITAL_BASED_OUTPATIENT_CLINIC_OR_DEPARTMENT_OTHER): Payer: Self-pay | Admitting: Orthopedic Surgery

## 2021-04-27 ENCOUNTER — Encounter: Payer: Self-pay | Admitting: Orthopedic Surgery

## 2021-04-27 ENCOUNTER — Ambulatory Visit (INDEPENDENT_AMBULATORY_CARE_PROVIDER_SITE_OTHER): Payer: Medicare Other | Admitting: Orthopedic Surgery

## 2021-04-27 ENCOUNTER — Other Ambulatory Visit: Payer: Self-pay

## 2021-04-27 DIAGNOSIS — M12571 Traumatic arthropathy, right ankle and foot: Secondary | ICD-10-CM

## 2021-04-27 NOTE — Progress Notes (Signed)
Office Visit Note   Patient: Manuel Pearson           Date of Birth: 1965-07-22           MRN: [id number] Visit Date: 04/27/2021              Requested by: No referring provider defined for this encounter. PCP: System, Provider Not In  Chief Complaint  Patient presents with   Right Ankle - Routine Post Op    04/13/21 right ankle scope and deb      HPI: Patient is a 56 year old gentleman who presents 2 weeks status post right ankle arthroscopy and debridement for traumatic arthritis right ankle.  He is currently ambulating in regular sneakers.  Assessment & Plan: Visit Diagnoses:  1. Traumatic arthritis of right ankle     Plan: Recommended toe raises for coordinated strengthening and to do this is much as he could each day he has good range of motion and as the swelling resolves anticipate his range of motion should improve.  I discussed with the patient that the [place] distal guidelines for permanent partial impairment would be a 10% impairment rating for the right ankle secondary to the arthroscopic debridement.  Follow-Up Instructions: Return in about 4 weeks (around 05/25/2021).   Ortho Exam  Patient is alert, oriented, no adenopathy, well-dressed, normal affect, normal respiratory effort. Examination patient does have swelling around the ankle there is no cellulitis there is no crepitation with range of motion.  The portals are clean and dry sutures are harvested.  Imaging: No results found. No images are attached to the encounter.  Labs: Lab Results  Component Value Date   REPTSTATUS 05/26/2013 FINAL 05/24/2013   CULT  05/24/2013    ESCHERICHIA COLI Performed at [facility]   LABORGA ESCHERICHIA COLI 05/24/2013     Lab Results  Component Value Date   ALBUMIN 3.6 03/29/2011    No results found for: MG No results found for: VD25OH  No results found for: PREALBUMIN CBC EXTENDED Latest Ref Rng & Units 09/10/2019  WBC 4.0 - 10.5 K/uL 6.5   RBC 4.22 - 5.81 MIL/uL 5.06  HGB 13.0 - 17.0 g/dL 02.7  HCT 25.3 - 66.4 % 46.5  PLT 150 - 400 K/uL 313     There is no height or weight on file to calculate BMI.  Orders:  No orders of the defined types were placed in this encounter.  No orders of the defined types were placed in this encounter.    Procedures: No procedures performed  Clinical Data: No additional findings.  ROS:  All other systems negative, except as noted in the HPI. Review of Systems  Objective: Vital Signs: There were no vitals taken for this visit.  Specialty Comments:  No specialty comments available.  PMFS History: Patient Active Problem List   Diagnosis Date Noted   Traumatic arthritis of right ankle    Impingement syndrome of right ankle    Hospice care patient 09/12/2019   Spinal stenosis of lumbar region 09/11/2019   Past Medical History:  Diagnosis Date   Anxiety    Elevated liver enzymes    has had increases due to headache meds, but back to normal    GERD (gastroesophageal reflux disease)    Headache(784.0)    MIGRAINES    Hypertension    Hypothyroidism    Pneumonia 2022   PONV (postoperative nausea and vomiting)    DIFFICULTY WAKING UP    PTSD (post-traumatic  stress disorder)    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 No pertinent family history.  Past Surgical History:  Procedure Laterality Date   ANKLE ARTHROSCOPY Right 04/13/2021   Procedure: RIGHT ANKLE ARTHROSCOPY AND DEBRIDEMENT;  Surgeon: Nadara Mustard, MD;  Location: [facility];  Service: Orthopedics;  Laterality: Right;   ANKLE FRACTURE SURGERY Right 04/2020   ANTERIOR CERVICAL DECOMP/DISCECTOMY FUSION  03/29/2011   Procedure: ANTERIOR CERVICAL DECOMPRESSION/DISCECTOMY FUSION 2 LEVELS;  Surgeon: Reinaldo Meeker, MD;  Location: [facility];  Service: Neurosurgery;  Laterality: N/A;  CERVICAL FOUR-FIVE, CERVICAL  SIX-SEVEN  ANTERIOR CERVICAL DECOMPRESSION FUSION  WITH TRABECULAR METAL  AND PLATE   APPENDECTOMY  03/2017   DISTAL BICEPS TENDON REPAIR  2008   LEFT    LUMBAR LAMINECTOMY/DECOMPRESSION MICRODISCECTOMY Right 09/11/2019   Procedure: Laminectomy and Foraminotomy - Lumbar Two-Three - right;  Surgeon: Donalee Citrin, MD;  Location: [facility] OR;  Service: Neurosurgery;  Laterality: Right;  right   NECK SURGERY  1995   FUSION    SHOULDER SURGERY  2000   RIGHT     LEFT  2004    Social History   Occupational History   Not on file  Tobacco Use   Smoking status: Every Day    Packs/day: 0.50    Years: 40.00    Pack years: 20.00    Types: Cigarettes   Smokeless tobacco: Never  Vaping Use   Vaping Use: Former  Substance and Sexual Activity   Alcohol use: Yes    Comment: occasionally   Drug use: Yes    Types: Marijuana    Comment: none since 04-2020   Sexual activity: Yes

## 2021-05-25 ENCOUNTER — Ambulatory Visit (INDEPENDENT_AMBULATORY_CARE_PROVIDER_SITE_OTHER): Payer: Medicare Other | Admitting: Orthopedic Surgery

## 2021-05-25 DIAGNOSIS — M12571 Traumatic arthropathy, right ankle and foot: Secondary | ICD-10-CM

## 2021-05-30 ENCOUNTER — Encounter: Payer: Self-pay | Admitting: Orthopedic Surgery

## 2021-05-30 NOTE — Progress Notes (Signed)
? ?Office Visit Note ?  ?Patient: Manuel Pearson           ?Date of Birth: [birth year]           ?MRN: [id number] ?Visit Date: 05/25/2021 ?             ?Requested by: No referring provider defined for this encounter. ?PCP: System, Provider Not In ? ?Chief Complaint  ?Patient presents with  ? Right Ankle - Routine Post Op  ?  04/13/21 right ankle scope and debridement   ? ? ? ? ?HPI: ?Patient is a 56 year old gentleman who is 10 weeks status post right ankle arthroscopy and debridement.  Patient has been working on strengthening with toe raises at home.  He states he does have some continued tenderness with on and off swelling. ? ?Assessment & Plan: ?Visit Diagnoses:  ?1. Traumatic arthritis of right ankle   ? ? ?Plan: Continue with strengthening increase activities as tolerated. ? ?Follow-Up Instructions: Return if symptoms worsen or fail to improve.  ? ?Ortho Exam ? ?Patient is alert, oriented, no adenopathy, well-dressed, normal affect, normal respiratory effort. ?Examination patient has good pain-free passive range of motion of the right ankle there is no signs of infection. ? ?Imaging: ?No results found. ?No images are attached to the encounter. ? ?Labs: ?Lab Results  ?Component Value Date  ? REPTSTATUS 05/26/2013 FINAL 05/24/2013  ? CULT  05/24/2013  ?  ESCHERICHIA COLI ?Performed at [facility]  ? LABORGA ESCHERICHIA COLI 05/24/2013  ? ? ? ?Lab Results  ?Component Value Date  ? ALBUMIN 3.6 03/29/2011  ? ? ?No results found for: MG ?No results found for: VD25OH ? ?No results found for: PREALBUMIN ?CBC EXTENDED Latest Ref Rng & Units 09/10/2019  ?WBC 4.0 - 10.5 K/uL 6.5  ?RBC 4.22 - 5.81 MIL/uL 5.06  ?HGB 13.0 - 17.0 g/dL 42.5  ?HCT 39.0 - 52.0 % 46.5  ?PLT 150 - 400 K/uL 313  ? ? ? ?There is no height or weight on file to calculate BMI. ? ?Orders:  ?No orders of the defined types were placed in this encounter. ? ?No orders of the defined types were placed in this encounter. ? ? ? Procedures: ?No procedures  performed ? ?Clinical Data: ?No additional findings. ? ?ROS: ? ?All other systems negative, except as noted in the HPI. ?Review of Systems ? ?Objective: ?Vital Signs: There were no vitals taken for this visit. ? ?Specialty Comments:  ?No specialty comments available. ? ?PMFS History: ?Patient Active Problem List  ? Diagnosis Date Noted  ? Traumatic arthritis of right ankle   ? Impingement syndrome of right ankle   ? Hospice care patient 09/12/2019  ? Spinal stenosis of lumbar region 09/11/2019  ? ?Past Medical History:  ?Diagnosis Date  ? Anxiety   ? Elevated liver enzymes   ? has had increases due to headache meds, but back to normal   ? GERD (gastroesophageal reflux disease)   ? Headache(784.0)   ? MIGRAINES   ? Hypertension   ? Hypothyroidism   ? Pneumonia 2022  ? PONV (postoperative nausea and vomiting)   ? DIFFICULTY WAKING UP   ? PTSD (post-traumatic stress disorder)   ? Seizures (HCC)   ? due to traumatic brain injury in military  ? Sleep apnea 2019  ? uses CPAP nightly  ?  ?History reviewed. No pertinent family history.  ?Past Surgical History:  ?Procedure Laterality Date  ? ANKLE ARTHROSCOPY Right 04/13/2021  ? Procedure: RIGHT ANKLE  ARTHROSCOPY AND DEBRIDEMENT;  Surgeon: Nadara Mustard, MD;  Location: [facility];  Service: Orthopedics;  Laterality: Right;  ? ANKLE FRACTURE SURGERY Right 04/2020  ? ANTERIOR CERVICAL DECOMP/DISCECTOMY FUSION  03/29/2011  ? Procedure: ANTERIOR CERVICAL DECOMPRESSION/DISCECTOMY FUSION 2 LEVELS;  Surgeon: Reinaldo Meeker, MD;  Location: [facility];  Service: Neurosurgery;  Laterality: N/A;  CERVICAL FOUR-FIVE, CERVICAL SIX-SEVEN  ANTERIOR CERVICAL DECOMPRESSION FUSION  WITH TRABECULAR METAL  AND PLATE  ? APPENDECTOMY  03/2017  ? DISTAL BICEPS TENDON REPAIR  2008  ? LEFT   ? LUMBAR LAMINECTOMY/DECOMPRESSION MICRODISCECTOMY Right 09/11/2019  ? Procedure: Laminectomy and Foraminotomy - Lumbar Two-Three - right;  Surgeon: Donalee Citrin, MD;  Location: [facility] OR;  Service:  Neurosurgery;  Laterality: Right;  right  ? NECK SURGERY  1995  ? FUSION   ? SHOULDER SURGERY  2000  ? RIGHT     LEFT  2004   ? ?Social History  ? ?Occupational History  ? Not on file  ?Tobacco Use  ? Smoking status: Every Day  ?  Packs/day: 0.50  ?  Years: 40.00  ?  Pack years: 20.00  ?  Types: Cigarettes  ? Smokeless tobacco: Never  ?Vaping Use  ? Vaping Use: Former  ?Substance and Sexual Activity  ? Alcohol use: Yes  ?  Comment: occasionally  ? Drug use: Yes  ?  Types: Marijuana  ?  Comment: none since 04-2020  ? Sexual activity: Yes  ? ? ? ? ? ?

## 2022-10-24 ENCOUNTER — Other Ambulatory Visit: Payer: Self-pay

## 2022-10-24 ENCOUNTER — Encounter (HOSPITAL_COMMUNITY): Payer: Self-pay

## 2022-10-24 ENCOUNTER — Emergency Department (HOSPITAL_COMMUNITY)
Admission: EM | Admit: 2022-10-24 | Discharge: 2022-10-25 | Disposition: A | Discharge status: Home / Self Care | Payer: No Typology Code available for payment source | Attending: Emergency Medicine | Admitting: Emergency Medicine

## 2022-10-24 DIAGNOSIS — M5441 Lumbago with sciatica, right side: Secondary | ICD-10-CM | POA: Diagnosis not present

## 2022-10-24 DIAGNOSIS — G8929 Other chronic pain: Secondary | ICD-10-CM | POA: Insufficient documentation

## 2022-10-24 DIAGNOSIS — M545 Low back pain, unspecified: Secondary | ICD-10-CM | POA: Diagnosis present

## 2022-10-24 NOTE — ED Triage Notes (Signed)
BIBA for chronic back pain 4 years. Fell today on buttocks. Denies LOC or head injury. States the fall did not make anything worse.Was mowing the lawn yesterday, felt more pain after. Pain now radiates down R leg which is new. No numbness or tingling. No other symptoms.

## 2022-10-25 ENCOUNTER — Emergency Department (HOSPITAL_COMMUNITY): Payer: No Typology Code available for payment source

## 2022-10-25 DIAGNOSIS — M5441 Lumbago with sciatica, right side: Secondary | ICD-10-CM | POA: Diagnosis not present

## 2022-10-25 MED ORDER — METHYLPREDNISOLONE 4 MG PO TBPK: ORAL_TABLET | ORAL | 0 refills | Status: DC

## 2022-10-25 MED ORDER — FENTANYL CITRATE PF 50 MCG/ML IJ SOSY
50.0000 ug | PREFILLED_SYRINGE | Freq: Once | INTRAMUSCULAR | Status: AC
  Administered 2022-10-25: 50 ug via INTRAMUSCULAR
  Filled 2022-10-25: qty 1

## 2022-10-25 MED ORDER — DEXAMETHASONE SODIUM PHOSPHATE 10 MG/ML IJ SOLN
10.0000 mg | Freq: Once | INTRAMUSCULAR | Status: AC
  Administered 2022-10-25: 10 mg via INTRAMUSCULAR
  Filled 2022-10-25: qty 1

## 2022-10-25 MED ORDER — KETOROLAC TROMETHAMINE 15 MG/ML IJ SOLN
15.0000 mg | Freq: Once | INTRAMUSCULAR | Status: AC
  Administered 2022-10-25: 15 mg via INTRAMUSCULAR
  Filled 2022-10-25: qty 1

## 2022-10-25 MED ORDER — FENTANYL CITRATE PF 50 MCG/ML IJ SOSY: 50.0000 ug | PREFILLED_SYRINGE | Freq: Once | INTRAMUSCULAR | Status: DC

## 2022-10-25 NOTE — Discharge Instructions (Addendum)
You were evaluated tonight for back pain.  Your CT scan summary is listed below.  This is likely a flare of your chronic back pain causing radiculopathy into the right leg.  I have prescribed a steroid Dosepak to be taken as directed.  Please contact your neurosurgeon for further evaluation and management as needed CT lumbar spine without contrast impression: 1. No acute fracture.  2. Multilevel degenerative disc disease and facet arthropathy  resulting in moderate-to-severe stenosis, most pronounced at L4-L5  and L5-S1.

## 2022-10-25 NOTE — ED Provider Notes (Signed)
[place] EMER[facility]ENC[facility] DEPARTMENT AT [facility] Provider Note   CSN: [id number] Arrival date & time: 10/24/22  1525     History  Chief Complaint  Patient presents with   Back Pain    Manuel Pearson is a 57 y.o. male.  Patient with past medical history significant for spinal stenosis of lumbar region presents to the emergency department complaining of right-sided low back pain with radiation down the right posterior leg.  Patient has history of spinal injections for the same and was most recently seen for follow-up on August 1.  At that time his pain had significantly improved with some mild radicular-like symptoms in the right leg.  He states that last week he rode a lawnmower and that the vibration/bouncing irritated his back significantly.  He states that earlier today he was trying to turn around to sit in his chair and tripped over his cane falling directly on his buttocks.  His primary team prescribed hydrocodone but [facility] reportedly is out for the next 2 weeks.  He denies saddle anesthesia, weakness, urinary incontinence, urinary retention, fecal incontinence.  HPI     Home Medications Prior to Admission medications   Medication Sig Start Date End Date Taking? Authorizing Provider  methylPREDNISolone (MEDROL DOSEPAK) 4 MG TBPK tablet Take as directed per package instructions 10/25/22  Yes Darrick Grinder, PA-C  butalbital-acetaminophen-caffeine (FIORICET) 50-325-40 MG tablet Take 1 tablet by mouth every 6 (six) hours as needed for migraine.  07/11/19   [provider]  cyclobenzaprine (FLEXERIL) 10 MG tablet Take 10 mg by mouth at bedtime.    [provider]  Erenumab-aooe (AIMOVIG) 70 MG/ML SOAJ Inject into the skin every 30 (thirty) days.    [provider]  HYDROcodone-acetaminophen (NORCO/VICODIN) 5-325 MG tablet Take 1 tablet by mouth every 4 (four) hours as needed for moderate pain. 04/13/21   Nadara Mustard, MD  levETIRAcetam (KEPPRA) 750 MG  tablet Take 750 mg by mouth 4 (four) times daily.    [provider]  levothyroxine (SYNTHROID, LEVOTHROID) 50 MCG tablet Take 50 mcg by mouth daily before breakfast.    [provider]  lisinopril (ZESTRIL) 20 MG tablet Take 10 mg by mouth daily. 08/19/19   [provider]  loratadine (CLARITIN) 10 MG tablet Take 10 mg by mouth daily at 12 noon.    [provider]  Multiple Vitamin (MULITIVITAMIN WITH MINERALS) TABS Take 1 tablet by mouth daily.     [provider]  omeprazole (PRILOSEC) 20 MG capsule Take 20 mg by mouth daily.    [provider]  OXcarbazepine (TRILEPTAL) 150 MG tablet Take 225 mg by mouth See admin instructions. PATIENT TAKES 225MG  TID AND 450MG  AT BEDTIME 06/26/19   [provider]  oxyCODONE-acetaminophen (PERCOCET/ROXICET) 5-325 MG tablet Take 1 tablet by mouth every 4 (four) hours as needed for severe pain. 04/13/21   Nadara Mustard, MD  prazosin (MINIPRESS) 2 MG capsule Take 2 mg by mouth at bedtime.     [provider]  propranolol (INNOPRAN XL) 120 MG 24 hr capsule Take 120 mg by mouth in the morning and at bedtime.    [provider]  rizatriptan (MAXALT) 10 MG tablet Take 10 mg by mouth as needed for migraine. May repeat in 2 hours if needed    [provider]  Saw Palmetto 450 MG CAPS Take 450 mg by mouth in the morning, at noon, in the evening, and at bedtime.  [provider]  sodium chloride 1 g tablet Take 1 g by mouth 2 (two) times daily with a meal.    [provider]  traZODone (DESYREL) 150 MG tablet Take by mouth at bedtime.    [provider]      Allergies    Topamax [topiramate]    Review of Systems   Review of Systems  Physical Exam Updated Vital Signs BP (!) 123/103   Pulse 69   Temp 97.9 F (36.6 C) (Oral)   Resp 18   Ht 5\' 7"  (1.702 m)   Wt 102.2 kg   SpO2 96%   BMI 35.29 kg/m  Physical Exam Vitals and nursing note  reviewed.  HENT:     Head: Normocephalic and atraumatic.  Eyes:     Pupils: Pupils are equal, round, and reactive to light.  Cardiovascular:     Rate and Rhythm: Normal rate and regular rhythm.  Pulmonary:     Effort: Pulmonary effort is normal. No respiratory distress.  Musculoskeletal:        General: Tenderness present. No swelling, deformity or signs of injury.     Cervical back: Normal range of motion.     Right lower leg: No edema.     Left lower leg: No edema.     Comments: Patient with tenderness to palpation of the right lower lumbar region.  No midline spinal tenderness appreciated.  Skin:    General: Skin is dry.  Neurological:     Mental Status: He is alert.     Sensory: No sensory deficit.     Motor: No weakness.  Psychiatric:        Speech: Speech normal.        Behavior: Behavior normal.     ED Results / Procedures / Treatments   Labs (all labs ordered are listed, but only abnormal results are displayed) Labs Reviewed - No data to display  EKG None  Radiology CT Lumbar Spine Wo Contrast  Result Date: 10/25/2022 CLINICAL DATA:  Back trauma, no prior imaging. Chronic back pain for 4 years, fall today. Pain radiating down right leg. EXAM: CT LUMBAR SPINE WITHOUT CONTRAST TECHNIQUE: Multidetector CT imaging of the lumbar spine was performed without intravenous contrast administration. Multiplanar CT image reconstructions were also generated. RADIATION DOSE REDUCTION: This exam was performed according to the departmental dose-optimization program which includes automated exposure control, adjustment of the mA and/or kV according to patient size and/or use of iterative reconstruction technique. COMPARISON:  None Available. FINDINGS: Segmentation: 5 lumbar type vertebrae. Alignment: There is mild retrolisthesis at L2-L3 at L5-S1. Loss of normal lumbar lordosis is noted. Vertebrae: No acute fracture. Paraspinal and other soft tissues: No acute abnormality. There is  atherosclerotic calcification of the aorta. Disc levels: L1-L2: There is a diffuse disc bulge with facet arthropathy resulting in mild spinal canal stenosis. There is mild neural foraminal stenosis bilaterally. L2-L3: There is diffuse disc herniation with disc osteophyte complex with a left lateral component. Facet arthropathy is present with mild ligamentum flavum thickening resulting in moderate spinal canal and neural foraminal stenosis. L3-L4: There is a diffuse disc bulge with facet arthropathy resulting in moderate spinal canal and neural foraminal stenosis bilaterally. L4-L5: There is a diffuse disc herniation with facet arthropathy and ligamentum flavum thickening resulting in moderate spinal canal and left neural foraminal stenosis. There is moderate-to-severe neural foraminal stenosis on the right. L5-S1: There is a diffuse disc herniation with endplate osteophyte formation and facet arthropathy resulting in  mild spinal canal and moderate to severe neural foraminal stenosis. IMPRESSION: 1. No acute fracture. 2. Multilevel degenerative disc disease and facet arthropathy resulting in moderate-to-severe stenosis, most pronounced at L4-L5 and L5-S1. Electronically Signed   By: Thornell Sartorius M.D.   On: 10/25/2022 03:26    Procedures Procedures    Medications Ordered in ED Medications  ketorolac (TORADOL) 15 MG/ML injection 15 mg (15 mg Intramuscular Given 10/25/22 0235)  dexamethasone (DECADRON) injection 10 mg (10 mg Intramuscular Given 10/25/22 0236)  fentaNYL (SUBLIMAZE) injection 50 mcg (50 mcg Intramuscular Given 10/25/22 0236)    ED Course/ Medical Decision Making/ A&P                                 Medical Decision Making Amount and/or Complexity of Data Reviewed Radiology: ordered.  Risk Prescription drug management.   This patient presents to the ED for concern of back pain, this involves an extensive number of treatment options, and is a complaint that carries with it a high  risk of complications and morbidity.  The differential diagnosis includes fracture, dislocation, disc herniation, lumbar radiculopathy, others   Co morbidities that complicate the patient evaluation  History of spinal stenosis   Additional history obtained:  Additional history obtained from family at bedside External records from outside source obtained and reviewed including notes from neurosurgery showing recent follow-up appointment with improvement in symptoms   Imaging Studies ordered:  I ordered imaging studies including CT lumbar spine without contrast I independently visualized and interpreted imaging which showed  1. No acute fracture.  2. Multilevel degenerative disc disease and facet arthropathy  resulting in moderate-to-severe stenosis, most pronounced at L4-L5  and L5-S1.   I agree with the radiologist interpretation    Problem List / ED Course / Critical interventions / Medication management   I ordered medication including fentanyl, Decadron, and Toradol for back pain Reevaluation of the patient after these medicines showed that the patient improved I have reviewed the patients home medicines and have made adjustments as needed   Social Determinants of Health:  [facility]   Test / Admission - Considered:  Patient with no acute fracture noted on imaging.  Patient with multilevel degenerative disc disease with moderate to severe stenosis which is likely causing right-sided radiculopathy.  Plan to discharge home with recommendations for continued follow-up with neurosurgery.  Will prescribe steroid Dosepak to help with inflammation.  Patient will also follow-up with primary team to see if they can send the prescription to a pharmacy that may be able to fill his hydrocodone.  No indication at this time for admission.  Return precautions provided.  Discharged home.         Final Clinical Impression(s) / ED  Diagnoses Final diagnoses:  Chronic right-sided low back pain with right-sided sciatica    Rx / DC Orders ED Discharge Orders          Ordered    methylPREDNISolone (MEDROL DOSEPAK) 4 MG TBPK tablet        10/25/22 0430              Pamala Duffel 10/25/22 0431    Dione Booze, MD 10/25/22 [phone]

## 2023-04-20 ENCOUNTER — Other Ambulatory Visit (HOSPITAL_COMMUNITY): Payer: Self-pay | Admitting: Student

## 2023-04-20 DIAGNOSIS — M5416 Radiculopathy, lumbar region: Secondary | ICD-10-CM

## 2023-04-24 ENCOUNTER — Ambulatory Visit (HOSPITAL_COMMUNITY)
Admission: RE | Admit: 2023-04-24 | Discharge: 2023-04-24 | Disposition: A | Discharge status: Home / Self Care | Payer: No Typology Code available for payment source | Source: Ambulatory Visit | Attending: Student | Admitting: Student

## 2023-04-24 DIAGNOSIS — M5416 Radiculopathy, lumbar region: Secondary | ICD-10-CM | POA: Insufficient documentation

## 2023-04-24 MED ORDER — GADOBUTROL 1 MMOL/ML IV SOLN
10.0000 mL | Freq: Once | INTRAVENOUS | Status: AC | PRN
  Administered 2023-04-24: 10 mL via INTRAVENOUS

## 2023-07-02 ENCOUNTER — Encounter (HOSPITAL_COMMUNITY): Payer: Self-pay | Admitting: Pharmacy Technician

## 2023-07-02 ENCOUNTER — Inpatient Hospital Stay (HOSPITAL_COMMUNITY)
Admission: EM | Disposition: A | Discharge status: Home / Self Care | Payer: Self-pay | Source: Home / Self Care | Attending: Cardiology

## 2023-07-02 ENCOUNTER — Other Ambulatory Visit: Payer: Self-pay

## 2023-07-02 ENCOUNTER — Inpatient Hospital Stay (HOSPITAL_COMMUNITY)
Admission: EM | Admit: 2023-07-02 | Discharge: 2023-07-03 | DRG: 322 | Disposition: A | Discharge status: Home / Self Care | Attending: Cardiology | Admitting: Cardiology

## 2023-07-02 DIAGNOSIS — I2119 ST elevation (STEMI) myocardial infarction involving other coronary artery of inferior wall: Secondary | ICD-10-CM | POA: Diagnosis not present

## 2023-07-02 DIAGNOSIS — Z716 Tobacco abuse counseling: Secondary | ICD-10-CM

## 2023-07-02 DIAGNOSIS — S069XAS Unspecified intracranial injury with loss of consciousness status unknown, sequela: Secondary | ICD-10-CM | POA: Diagnosis not present

## 2023-07-02 DIAGNOSIS — K219 Gastro-esophageal reflux disease without esophagitis: Secondary | ICD-10-CM | POA: Diagnosis present

## 2023-07-02 DIAGNOSIS — I2111 ST elevation (STEMI) myocardial infarction involving right coronary artery: Principal | ICD-10-CM | POA: Diagnosis present

## 2023-07-02 DIAGNOSIS — Z9185 Personal history of military service: Secondary | ICD-10-CM | POA: Diagnosis not present

## 2023-07-02 DIAGNOSIS — Z981 Arthrodesis status: Secondary | ICD-10-CM

## 2023-07-02 DIAGNOSIS — F17210 Nicotine dependence, cigarettes, uncomplicated: Secondary | ICD-10-CM | POA: Diagnosis present

## 2023-07-02 DIAGNOSIS — F4310 Post-traumatic stress disorder, unspecified: Secondary | ICD-10-CM | POA: Diagnosis present

## 2023-07-02 DIAGNOSIS — E785 Hyperlipidemia, unspecified: Secondary | ICD-10-CM | POA: Insufficient documentation

## 2023-07-02 DIAGNOSIS — E7800 Pure hypercholesterolemia, unspecified: Secondary | ICD-10-CM | POA: Diagnosis present

## 2023-07-02 DIAGNOSIS — R079 Chest pain, unspecified: Secondary | ICD-10-CM | POA: Diagnosis present

## 2023-07-02 DIAGNOSIS — Z79899 Other long term (current) drug therapy: Secondary | ICD-10-CM | POA: Diagnosis not present

## 2023-07-02 DIAGNOSIS — Z720 Tobacco use: Secondary | ICD-10-CM | POA: Insufficient documentation

## 2023-07-02 DIAGNOSIS — G8929 Other chronic pain: Secondary | ICD-10-CM | POA: Diagnosis present

## 2023-07-02 DIAGNOSIS — Z79890 Hormone replacement therapy (postmenopausal): Secondary | ICD-10-CM

## 2023-07-02 DIAGNOSIS — Z888 Allergy status to other drugs, medicaments and biological substances status: Secondary | ICD-10-CM | POA: Diagnosis not present

## 2023-07-02 DIAGNOSIS — R569 Unspecified convulsions: Secondary | ICD-10-CM | POA: Diagnosis present

## 2023-07-02 DIAGNOSIS — Z7982 Long term (current) use of aspirin: Secondary | ICD-10-CM

## 2023-07-02 DIAGNOSIS — D72829 Elevated white blood cell count, unspecified: Secondary | ICD-10-CM | POA: Diagnosis present

## 2023-07-02 DIAGNOSIS — I213 ST elevation (STEMI) myocardial infarction of unspecified site: Principal | ICD-10-CM

## 2023-07-02 DIAGNOSIS — I2510 Atherosclerotic heart disease of native coronary artery without angina pectoris: Secondary | ICD-10-CM | POA: Insufficient documentation

## 2023-07-02 DIAGNOSIS — E039 Hypothyroidism, unspecified: Secondary | ICD-10-CM | POA: Diagnosis present

## 2023-07-02 DIAGNOSIS — M549 Dorsalgia, unspecified: Secondary | ICD-10-CM | POA: Diagnosis present

## 2023-07-02 DIAGNOSIS — E871 Hypo-osmolality and hyponatremia: Secondary | ICD-10-CM | POA: Diagnosis not present

## 2023-07-02 DIAGNOSIS — I10 Essential (primary) hypertension: Secondary | ICD-10-CM | POA: Diagnosis present

## 2023-07-02 DIAGNOSIS — Z7902 Long term (current) use of antithrombotics/antiplatelets: Secondary | ICD-10-CM

## 2023-07-02 DIAGNOSIS — Z955 Presence of coronary angioplasty implant and graft: Secondary | ICD-10-CM

## 2023-07-02 HISTORY — PX: CORONARY/GRAFT ACUTE MI REVASCULARIZATION: CATH118305

## 2023-07-02 HISTORY — PX: LEFT HEART CATH AND CORONARY ANGIOGRAPHY: CATH118249

## 2023-07-02 LAB — MRSA NEXT GEN BY PCR, NASAL: MRSA by PCR Next Gen: NOT DETECTED

## 2023-07-02 LAB — CBC WITH DIFFERENTIAL/PLATELET
Abs Immature Granulocytes: 0.03 10*3/uL (ref 0.00–0.07)
Basophils Absolute: 0.1 10*3/uL (ref 0.0–0.1)
Basophils Relative: 1 %
Eosinophils Absolute: 0.2 10*3/uL (ref 0.0–0.5)
Eosinophils Relative: 1 %
HCT: 47.8 % (ref 39.0–52.0)
Hemoglobin: 16.8 g/dL (ref 13.0–17.0)
Immature Granulocytes: 0 %
Lymphocytes Relative: 32 %
Lymphs Abs: 3.6 10*3/uL (ref 0.7–4.0)
MCH: 33.1 pg (ref 26.0–34.0)
MCHC: 35.1 g/dL (ref 30.0–36.0)
MCV: 94.1 fL (ref 80.0–100.0)
Monocytes Absolute: 0.9 10*3/uL (ref 0.1–1.0)
Monocytes Relative: 8 %
Neutro Abs: 6.5 10*3/uL (ref 1.7–7.7)
Neutrophils Relative %: 58 %
Platelets: 257 10*3/uL (ref 150–400)
RBC: 5.08 MIL/uL (ref 4.22–5.81)
RDW: 11.9 % (ref 11.5–15.5)
WBC: 11.3 10*3/uL — ABNORMAL HIGH (ref 4.0–10.5)
nRBC: 0 % (ref 0.0–0.2)

## 2023-07-02 LAB — LIPID PANEL
Cholesterol: 156 mg/dL (ref 0–200)
HDL: 34 mg/dL — ABNORMAL LOW (ref >40)
LDL Cholesterol: 101 mg/dL — ABNORMAL HIGH (ref 0–99)
Total CHOL/HDL Ratio: 4.6 ratio
Triglycerides: 103 mg/dL (ref <150)
VLDL: 21 mg/dL (ref 0–40)

## 2023-07-02 LAB — COMPREHENSIVE METABOLIC PANEL WITH GFR
ALT: 34 U/L (ref 0–44)
AST: 37 U/L (ref 15–41)
Albumin: 3.8 g/dL (ref 3.5–5.0)
Alkaline Phosphatase: 78 U/L (ref 38–126)
Anion gap: 14 (ref 5–15)
BUN: 6 mg/dL (ref 6–20)
CO2: 22 mmol/L (ref 22–32)
Calcium: 9.5 mg/dL (ref 8.9–10.3)
Chloride: 94 mmol/L — ABNORMAL LOW (ref 98–111)
Creatinine, Ser: 0.86 mg/dL (ref 0.61–1.24)
GFR, Estimated: >60 mL/min (ref >60)
Glucose, Bld: 98 mg/dL (ref 70–99)
Potassium: 4.6 mmol/L (ref 3.5–5.1)
Sodium: 130 mmol/L — ABNORMAL LOW (ref 135–145)
Total Bilirubin: 0.8 mg/dL (ref 0.0–1.2)
Total Protein: 6.7 g/dL (ref 6.5–8.1)

## 2023-07-02 LAB — PROTIME-INR
INR: 1 (ref 0.8–1.2)
Prothrombin Time: 12.9 s (ref 11.4–15.2)

## 2023-07-02 LAB — CG4 I-STAT (LACTIC ACID): Lactic Acid, Venous: 1 mmol/L (ref 0.5–1.9)

## 2023-07-02 LAB — I-STAT CG4 LACTIC ACID, ED: Lactic Acid, Venous: 2.5 mmol/L (ref 0.5–1.9)

## 2023-07-02 LAB — TROPONIN I (HIGH SENSITIVITY): Troponin I (High Sensitivity): 89 ng/L — ABNORMAL HIGH (ref <18)

## 2023-07-02 LAB — POCT ACTIVATED CLOTTING TIME: Activated Clotting Time: 297 s

## 2023-07-02 LAB — I-STAT CHEM 8, ED
BUN: 5 mg/dL — ABNORMAL LOW (ref 6–20)
Calcium, Ion: 1.12 mmol/L — ABNORMAL LOW (ref 1.15–1.40)
Chloride: 96 mmol/L — ABNORMAL LOW (ref 98–111)
Creatinine, Ser: 0.8 mg/dL (ref 0.61–1.24)
Glucose, Bld: 98 mg/dL (ref 70–99)
HCT: 50 % (ref 39.0–52.0)
Hemoglobin: 17 g/dL (ref 13.0–17.0)
Potassium: 4.5 mmol/L (ref 3.5–5.1)
Sodium: 129 mmol/L — ABNORMAL LOW (ref 135–145)
TCO2: 25 mmol/L (ref 22–32)

## 2023-07-02 LAB — GLUCOSE, CAPILLARY: Glucose-Capillary: 104 mg/dL — ABNORMAL HIGH (ref 70–99)

## 2023-07-02 LAB — APTT: aPTT: 30 s (ref 24–36)

## 2023-07-02 SURGERY — CORONARY/GRAFT ACUTE MI REVASCULARIZATION
Anesthesia: LOCAL

## 2023-07-02 MED ORDER — PROPRANOLOL HCL ER BEADS 120 MG PO CP24: 120.0000 mg | ORAL_CAPSULE | Freq: Every day | ORAL | Status: DC

## 2023-07-02 MED ORDER — SODIUM CHLORIDE 0.9% FLUSH: 3.0000 mL | INTRAVENOUS | Status: DC | PRN

## 2023-07-02 MED ORDER — MIDAZOLAM HCL 2 MG/2ML IJ SOLN
INTRAMUSCULAR | Status: DC | PRN
  Administered 2023-07-02: 2 mg via INTRAVENOUS

## 2023-07-02 MED ORDER — ONDANSETRON HCL 4 MG/2ML IJ SOLN: 4.0000 mg | Freq: Four times a day (QID) | INTRAMUSCULAR | Status: DC | PRN

## 2023-07-02 MED ORDER — SODIUM CHLORIDE 1 G PO TABS
1.0000 g | ORAL_TABLET | Freq: Two times a day (BID) | ORAL | Status: DC
  Administered 2023-07-03: 1 g via ORAL
  Filled 2023-07-02: qty 1

## 2023-07-02 MED ORDER — ACETAMINOPHEN 325 MG PO TABS: 650.0000 mg | ORAL_TABLET | ORAL | Status: DC | PRN

## 2023-07-02 MED ORDER — PRAZOSIN HCL 2 MG PO CAPS
2.0000 mg | ORAL_CAPSULE | Freq: Every day | ORAL | Status: DC
  Filled 2023-07-02 (×2): qty 1

## 2023-07-02 MED ORDER — CHLORHEXIDINE GLUCONATE CLOTH 2 % EX PADS
6.0000 | MEDICATED_PAD | Freq: Every day | CUTANEOUS | Status: DC
  Administered 2023-07-02 – 2023-07-03 (×2): 6 via TOPICAL

## 2023-07-02 MED ORDER — LEVETIRACETAM 250 MG PO TABS
750.0000 mg | ORAL_TABLET | Freq: Four times a day (QID) | ORAL | Status: DC
  Administered 2023-07-02 – 2023-07-03 (×3): 750 mg via ORAL
  Filled 2023-07-02 (×3): qty 3

## 2023-07-02 MED ORDER — OXCARBAZEPINE 300 MG PO TABS
450.0000 mg | ORAL_TABLET | Freq: Every day | ORAL | Status: DC
  Administered 2023-07-03: 450 mg via ORAL
  Filled 2023-07-02 (×3): qty 1

## 2023-07-02 MED ORDER — SODIUM CHLORIDE 0.9 % IV SOLN: 250.0000 mL | INTRAVENOUS | Status: DC | PRN

## 2023-07-02 MED ORDER — MIDAZOLAM HCL 2 MG/2ML IJ SOLN
INTRAMUSCULAR | Status: AC
  Filled 2023-07-02: qty 2

## 2023-07-02 MED ORDER — NICOTINE 14 MG/24HR TD PT24
14.0000 mg | MEDICATED_PATCH | Freq: Every day | TRANSDERMAL | Status: DC
  Filled 2023-07-02: qty 1

## 2023-07-02 MED ORDER — HEPARIN SODIUM (PORCINE) 5000 UNIT/ML IJ SOLN
4000.0000 [IU] | Freq: Once | INTRAMUSCULAR | Status: AC
  Administered 2023-07-02: 4000 [IU] via INTRAVENOUS

## 2023-07-02 MED ORDER — PRASUGREL HCL 10 MG PO TABS
ORAL_TABLET | ORAL | Status: AC
  Filled 2023-07-02: qty 6

## 2023-07-02 MED ORDER — HEPARIN SODIUM (PORCINE) 1000 UNIT/ML IJ SOLN
INTRAMUSCULAR | Status: AC
  Filled 2023-07-02: qty 10

## 2023-07-02 MED ORDER — TRAZODONE HCL 50 MG PO TABS
50.0000 mg | ORAL_TABLET | Freq: Every day | ORAL | Status: DC
  Administered 2023-07-02: 50 mg via ORAL
  Filled 2023-07-02: qty 1

## 2023-07-02 MED ORDER — VERAPAMIL HCL 2.5 MG/ML IV SOLN
INTRAVENOUS | Status: DC | PRN
  Administered 2023-07-02: 10 mL via INTRA_ARTERIAL

## 2023-07-02 MED ORDER — VERAPAMIL HCL 2.5 MG/ML IV SOLN
INTRAVENOUS | Status: AC
  Filled 2023-07-02: qty 2

## 2023-07-02 MED ORDER — PRASUGREL HCL 10 MG PO TABS
ORAL_TABLET | ORAL | Status: AC
  Filled 2023-07-02: qty 1

## 2023-07-02 MED ORDER — ATORVASTATIN CALCIUM 80 MG PO TABS
80.0000 mg | ORAL_TABLET | Freq: Every day | ORAL | Status: DC
  Administered 2023-07-02 – 2023-07-03 (×2): 80 mg via ORAL
  Filled 2023-07-02 (×2): qty 1

## 2023-07-02 MED ORDER — HEPARIN SODIUM (PORCINE) 1000 UNIT/ML IJ SOLN
INTRAMUSCULAR | Status: DC | PRN
  Administered 2023-07-02: 9000 [IU] via INTRAVENOUS

## 2023-07-02 MED ORDER — FENTANYL CITRATE (PF) 100 MCG/2ML IJ SOLN
INTRAMUSCULAR | Status: AC
  Filled 2023-07-02: qty 2

## 2023-07-02 MED ORDER — LIDOCAINE HCL (PF) 1 % IJ SOLN
INTRAMUSCULAR | Status: AC
Start: 2023-07-02 — End: ?
  Filled 2023-07-02: qty 30

## 2023-07-02 MED ORDER — NITROGLYCERIN 1 MG/10 ML FOR IR/CATH LAB
INTRA_ARTERIAL | Status: DC | PRN
  Administered 2023-07-02: 200 ug via INTRACORONARY

## 2023-07-02 MED ORDER — CYCLOBENZAPRINE HCL 10 MG PO TABS
10.0000 mg | ORAL_TABLET | Freq: Every day | ORAL | Status: DC
  Administered 2023-07-02: 10 mg via ORAL
  Filled 2023-07-02: qty 1

## 2023-07-02 MED ORDER — TIROFIBAN HCL IN NACL 5-0.9 MG/100ML-% IV SOLN
INTRAVENOUS | Status: AC | PRN
  Administered 2023-07-02: .15 ug/kg/min via INTRAVENOUS

## 2023-07-02 MED ORDER — NITROGLYCERIN 1 MG/10 ML FOR IR/CATH LAB
INTRA_ARTERIAL | Status: AC
Start: 2023-07-02 — End: 2023-07-03
  Filled 2023-07-02: qty 10

## 2023-07-02 MED ORDER — PRASUGREL HCL 10 MG PO TABS
ORAL_TABLET | ORAL | Status: DC | PRN
  Administered 2023-07-02: 60 mg via ORAL

## 2023-07-02 MED ORDER — ASPIRIN 81 MG PO CHEW
81.0000 mg | CHEWABLE_TABLET | Freq: Every day | ORAL | Status: DC
  Administered 2023-07-03: 81 mg via ORAL
  Filled 2023-07-02: qty 1

## 2023-07-02 MED ORDER — BUTALBITAL-APAP-CAFFEINE 50-325-40 MG PO TABS: 1.0000 | ORAL_TABLET | Freq: Four times a day (QID) | ORAL | Status: DC | PRN

## 2023-07-02 MED ORDER — IOHEXOL 350 MG/ML SOLN
INTRAVENOUS | Status: DC | PRN
  Administered 2023-07-02: 90 mL

## 2023-07-02 MED ORDER — PRASUGREL HCL 10 MG PO TABS
10.0000 mg | ORAL_TABLET | Freq: Every day | ORAL | Status: DC
  Administered 2023-07-03: 10 mg via ORAL
  Filled 2023-07-02: qty 1

## 2023-07-02 MED ORDER — FENTANYL CITRATE (PF) 100 MCG/2ML IJ SOLN
INTRAMUSCULAR | Status: DC | PRN
  Administered 2023-07-02 (×2): 50 ug via INTRAVENOUS

## 2023-07-02 MED ORDER — LIDOCAINE HCL (PF) 1 % IJ SOLN
INTRAMUSCULAR | Status: DC | PRN
  Administered 2023-07-02: 2 mL

## 2023-07-02 MED ORDER — LEVETIRACETAM 250 MG PO TABS: 750.0000 mg | ORAL_TABLET | Freq: Four times a day (QID) | ORAL | Status: DC

## 2023-07-02 MED ORDER — OXCARBAZEPINE 150 MG PO TABS
225.0000 mg | ORAL_TABLET | Freq: Three times a day (TID) | ORAL | Status: DC
  Administered 2023-07-03 (×2): 225 mg via ORAL
  Filled 2023-07-02 (×3): qty 1.5

## 2023-07-02 MED ORDER — HEPARIN (PORCINE) IN NACL 1000-0.9 UT/500ML-% IV SOLN
INTRAVENOUS | Status: DC | PRN
  Administered 2023-07-02 (×2): 500 mL

## 2023-07-02 MED ORDER — METOPROLOL TARTRATE 25 MG PO TABS
25.0000 mg | ORAL_TABLET | Freq: Two times a day (BID) | ORAL | Status: DC
  Administered 2023-07-03: 25 mg via ORAL
  Filled 2023-07-02 (×2): qty 1

## 2023-07-02 MED ORDER — TIROFIBAN (AGGRASTAT) BOLUS VIA INFUSION
INTRAVENOUS | Status: DC | PRN
  Administered 2023-07-02: 2675 ug via INTRAVENOUS

## 2023-07-02 MED ORDER — NICOTINE 14 MG/24HR TD PT24
14.0000 mg | MEDICATED_PATCH | Freq: Every day | TRANSDERMAL | Status: DC
  Administered 2023-07-02 – 2023-07-03 (×2): 14 mg via TRANSDERMAL
  Filled 2023-07-02: qty 1

## 2023-07-02 MED ORDER — SAW PALMETTO 450 MG PO CAPS: 450.0000 mg | ORAL_CAPSULE | Freq: Every day | ORAL | Status: DC

## 2023-07-02 MED ORDER — LEVOTHYROXINE SODIUM 50 MCG PO TABS
50.0000 ug | ORAL_TABLET | Freq: Every day | ORAL | Status: DC
  Administered 2023-07-03: 50 ug via ORAL
  Filled 2023-07-02: qty 1

## 2023-07-02 MED ORDER — PANTOPRAZOLE SODIUM 40 MG PO TBEC
40.0000 mg | DELAYED_RELEASE_TABLET | Freq: Every day | ORAL | Status: DC
  Administered 2023-07-02 – 2023-07-03 (×2): 40 mg via ORAL
  Filled 2023-07-02 (×2): qty 1

## 2023-07-02 SURGICAL SUPPLY — 15 items
CATH EXTRAC PRONTO LP 6F RND (CATHETERS) IMPLANT
CATH INFINITI AMBI 5FR TG (CATHETERS) IMPLANT
CATH LAUNCHER 6FR JR4 (CATHETERS) IMPLANT
COVER SWIFTLINK CONNECTOR (BAG) IMPLANT
DEVICE RAD COMP TR BAND LRG (VASCULAR PRODUCTS) IMPLANT
GLIDESHEATH SLEND A-KIT 6F 22G (SHEATH) IMPLANT
GUIDEWIRE INQWIRE 1.5J.035X260 (WIRE) IMPLANT
INQWIRE 1.5J .035X260CM (WIRE) ×1 IMPLANT
KIT ENCORE 26 ADVANTAGE (KITS) IMPLANT
KIT HEMO VALVE WATCHDOG (MISCELLANEOUS) IMPLANT
PACK CARDIAC CATHETERIZATION (CUSTOM PROCEDURE TRAY) ×1 IMPLANT
SET ATX-X65L (MISCELLANEOUS) IMPLANT
STENT SYNERGY XD 4.0X32 (Permanent Stent) IMPLANT
SYNERGY XD 4.0X32 (Permanent Stent) ×1 IMPLANT
WIRE RUNTHROUGH .014X180CM (WIRE) IMPLANT

## 2023-07-02 NOTE — H&P (Signed)
 CARDIOLOGY ADMIT NOTE     Patient ID: Manuel Pearson MRN: [id number] DOB/AGE: [birth year] 58 y.o.  Admit date: 07/02/2023.   Primary Physician:  Cl[facility]  Patient ID: Manuel Pearson, male    DOB: 25-Nov-1965, 58 y.o.   MRN: [id number]  Chief Complaint  Patient presents with   Chest Pain   HPI:    Manuel Pearson  is a 58 y.o. chronic back pain, PTSD, hypertension, hypercholesterolemia, no family history of premature coronary disease or sudden cardiac death started having chest discomfort around 430 this afternoon at home with radiation to his neck and left arm.  No other associated symptoms.  As the pain was getting intense, he activated EMS and was found to have EKG abnormalities suggestive of inferolateral STEMI.  He will came and presented to [facility] emergency room.  Patient is still having chest discomfort.  Denies any dizziness or syncope, no other associated symptoms.  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   Sleep apnea 2019   uses CPAP nightly   Past Surgical History:  Procedure Laterality Date   ANKLE ARTHROSCOPY Right 04/13/2021   Procedure: RIGHT ANKLE ARTHROSCOPY AND DEBRIDEMENT;  Surgeon: Timothy Ford, MD;  Location: [facility];  Service: Orthopedics;  Laterality: Right;   ANKLE FRACTURE SURGERY Right 04/2020   ANTERIOR CERVICAL DECOMP/DISCECTOMY FUSION  03/29/2011   Procedure: ANTERIOR CERVICAL DECOMPRESSION/DISCECTOMY FUSION 2 LEVELS;  Surgeon: Augustine Blocker, MD;  Location: [facility];  Service: Neurosurgery;  Laterality: N/A;  CERVICAL  FOUR-FIVE, CERVICAL SIX-SEVEN  ANTERIOR CERVICAL DECOMPRESSION FUSION  WITH TRABECULAR METAL  AND PLATE   APPENDECTOMY  03/2017   DISTAL BICEPS TENDON REPAIR  2008   LEFT    LUMBAR LAMINECTOMY/DECOMPRESSION MICRODISCECTOMY Right 09/11/2019   Procedure: Laminectomy and Foraminotomy - Lumbar Two-Three - right;  Surgeon: Gearl Keens, MD;  Location: [facility] OR;  Service: Neurosurgery;  Laterality: Right;  right   NECK SURGERY  1995   FUSION    SHOULDER SURGERY  2000   RIGHT     LEFT  2004    Social History   Socioeconomic History   Marital status: Married    Spouse name: Not on file   Number of children: Not on file   Years of education: Not on file   Highest education level: Not on file  Occupational History   Not on file  Tobacco Use   Smoking status: Every Day    Current packs/day: 0.50    Average packs/day: 0.5 packs/day for 40.0 years (20.0 ttl pk-yrs)    Types: Cigarettes   Smokeless tobacco: Never  Vaping Use   Vaping status: Former  Substance and Sexual Activity   Alcohol use: Yes    Comment: occasionally   Drug use: Yes    Types: Marijuana    Comment: none since 04-2020   Sexual activity: Yes  Other Topics Concern   Not on file  Social History Narrative   Not on file  Social Drivers of Corporate investment banker Strain: Not on file  Food Insecurity: Not on file  Transportation Needs: Not on file  Physical Activity: Not on file  Stress: Not on file  Social Connections: Not on file  Intimate Partner Violence: Not on file   History reviewed. No pertinent family history.  ROS  Review of Systems  Cardiovascular:  Positive for chest pain. Negative for dyspnea on exertion and leg swelling.   Objective      07/02/2023    6:49 PM 07/02/2023    6:40 PM 07/02/2023    6:31 PM  Vitals with BMI  Height   5\' 7"   Weight   225 lbs  BMI   35.23  Systolic 149 178   Diastolic 95 90   Pulse 63 66       Physical Exam Constitutional:      Appearance: He is obese.   Neck:     Vascular: No carotid bruit or JVD.  Cardiovascular:     Rate and Rhythm: Normal rate and regular rhythm.     Pulses: Intact distal pulses.     Heart sounds: Normal heart sounds. No murmur heard.    No gallop.  Pulmonary:     Effort: Pulmonary effort is normal.     Breath sounds: Normal breath sounds.  Abdominal:     General: Bowel sounds are normal.     Palpations: Abdomen is soft.  Musculoskeletal:     Right lower leg: No edema.     Left lower leg: No edema.    Laboratory examination:   Recent Labs    07/02/23 1847  NA 129*  K 4.5  CL 96*  GLUCOSE 98  BUN 5*  CREATININE 0.80   estimated creatinine clearance is 113.2 mL/min (by C-G formula based on SCr of 0.8 mg/dL).     Latest Ref Rng & Units 07/02/2023    6:47 PM 04/09/2021   11:00 AM 09/11/2019    1:20 PM  CMP  Glucose 70 - 99 mg/dL 98  829  562   BUN 6 - 20 mg/dL 5  10  6    Creatinine 0.61 - 1.24 mg/dL 1.30  8.65  7.84   Sodium 135 - 145 mmol/L 129  133  127   Potassium 3.5 - 5.1 mmol/L 4.5  4.3  4.1   Chloride 98 - 111 mmol/L 96  101  95   CO2 22 - 32 mmol/L  25  23   Calcium  8.9 - 10.3 mg/dL  9.3  9.1       Latest Ref Rng & Units 07/02/2023    6:47 PM 07/02/2023    6:41 PM 09/10/2019    9:15 AM  CBC  WBC 4.0 - 10.5 K/uL  11.3  6.5   Hemoglobin 13.0 - 17.0 g/dL 69.6  29.5  28.4   Hematocrit 39.0 - 52.0 % 50.0  47.8  46.5   Platelets 150 - 400 K/uL  257  313    Medications and allergies   Allergies  Allergen Reactions   Topamax [Topiramate]     Touretts symptoms    Current Outpatient Medications  Medication Instructions   butalbital -acetaminophen -caffeine  (FIORICET) 50-325-40 MG tablet 1 tablet, Oral, Every 6 hours PRN   cyclobenzaprine  (FLEXERIL ) 10 mg, Oral, Daily at bedtime   Erenumab-aooe (AIMOVIG) 70 MG/ML SOAJ Subcutaneous, Every 30 days   HYDROcodone -acetaminophen  (NORCO/VICODIN) 5-325 MG tablet 1 tablet, Oral, Every 4 hours PRN   levETIRAcetam  (KEPPRA ) 750 mg, Oral,  4 times daily    levothyroxine  (SYNTHROID ) 50 mcg, Oral, Daily before breakfast   lisinopril  (ZESTRIL ) 10 mg, Oral, Daily   loratadine  (CLARITIN ) 10 mg, Oral, Daily   methylPREDNISolone  (MEDROL  DOSEPAK) 4 MG TBPK tablet Take as directed per package instructions   Multiple Vitamin (MULITIVITAMIN WITH MINERALS) TABS 1 tablet, Oral, Daily   omeprazole (PRILOSEC) 20 mg, Oral, Daily   OXcarbazepine  (TRILEPTAL ) 225 mg, Oral, See admin instructions, PATIENT TAKES 225MG  TID AND 450MG  AT BEDTIME   oxyCODONE -acetaminophen  (PERCOCET/ROXICET) 5-325 MG tablet 1 tablet, Oral, Every 4 hours PRN   prazosin  (MINIPRESS ) 2 mg, Oral, Daily at bedtime   propranolol  (INNOPRAN  XL) 120 mg, Oral, 2 times daily   rizatriptan (MAXALT) 10 mg, Oral, As needed, May repeat in 2 hours if needed   Saw Palmetto  450 mg, Oral, 4 times daily   sodium chloride  1 g, Oral, 2 times daily with meals   traZODone  (DESYREL ) 150 MG tablet Oral, Daily at bedtime   Radiology:  No results found.  Cardiac Studies:   EKG 07/02/2023 at 1834: Sinus rhythm with first-degree AV block at the rate of 61 bpm, ST elevation in the inferior leads, acute inferior STEMI probable lateral extension.  Assessment   1.  Acute inferolateral STEMI 2.  Primary hypertension 3.  Hypercholesterolemia 4.  Hyponatremia  Recommendations:   Patient with ongoing chest discomfort with significant EKG abnormalities suggesting STEMI, will take him directly to the cardiac catheterization lab.  Schedule for cardiac catheterization, and possible angioplasty. We discussed regarding risks, benefits, alternatives to this including stress testing, CTA and continued medical therapy. Patient wants to proceed. Understands <1-2% risk of death, stroke, MI, urgent CABG, bleeding, infection, renal failure but not limited to these.  His wife is present at the bedside, I have discussed with her regarding possible complications.  Patient is presently full code.  Will treat his hypertension  cholesterol appropriately, will evaluate his hyponatremia once he is hemodynamically stable and stable from cardiac standpoint as well.  Hyponatremia appears to be chronic.  Patient presently on ezetimibe but not on a statin, but he is willing to start statin therapy.  All questions answered.      Knox Perl, MD, [facility] 07/02/2023, 7:08 PM [facility] [address] [place], [place] [address] Phone: [phone]. Fax:  [phone]

## 2023-07-02 NOTE — ED Provider Notes (Signed)
 [place] EMERGENCY DEPARTMENT AT [facility] Provider Note   CSN: [id number] Arrival date & time: 07/02/23  1830     History  Chief Complaint  Patient presents with   Chest Pain    Manuel Pearson is a 58 y.o. male.  Patient here with sudden chest pain that started 2 hours ago with central pain radiating to his left neck.  STEMI activated in the field for ST elevations in inferior leads.  History of hypertension seizures.  Recent back surgery couple weeks ago.  Denies any shortness of breath weakness numbness tingling.  Denies any headache fever chills.  No smoking history.  Occasional alcohol use.  The history is provided by the patient.       Home Medications Prior to Admission medications   Medication Sig Start Date End Date Taking? Authorizing Provider  butalbital -acetaminophen -caffeine  (FIORICET) 50-325-40 MG tablet Take 1 tablet by mouth every 6 (six) hours as needed for migraine.  07/11/19   [provider]  cyclobenzaprine  (FLEXERIL ) 10 MG tablet Take 10 mg by mouth at bedtime.    [provider]  Erenumab-aooe (AIMOVIG) 70 MG/ML SOAJ Inject into the skin every 30 (thirty) days.    [provider]  HYDROcodone -acetaminophen  (NORCO/VICODIN) 5-325 MG tablet Take 1 tablet by mouth every 4 (four) hours as needed for moderate pain. 04/13/21   Timothy Ford, MD  levETIRAcetam  (KEPPRA ) 750 MG tablet Take 750 mg by mouth 4 (four) times daily.    [provider]  levothyroxine  (SYNTHROID , LEVOTHROID) 50 MCG tablet Take 50 mcg by mouth daily before breakfast.    [provider]  lisinopril  (ZESTRIL ) 20 MG tablet Take 10 mg by mouth daily. 08/19/19   [provider]  loratadine  (CLARITIN ) 10 MG tablet Take 10 mg by mouth daily at 12 noon.    [provider]  methylPREDNISolone  (MEDROL  DOSEPAK) 4 MG TBPK tablet Take as directed per package instructions 10/25/22   Elisa Guest, PA-C  Multiple Vitamin  (MULITIVITAMIN WITH MINERALS) TABS Take 1 tablet by mouth daily.     [provider]  omeprazole (PRILOSEC) 20 MG capsule Take 20 mg by mouth daily.    [provider]  OXcarbazepine  (TRILEPTAL ) 150 MG tablet Take 225 mg by mouth See admin instructions. PATIENT TAKES 225MG  TID AND 450MG  AT BEDTIME 06/26/19   [provider]  oxyCODONE -acetaminophen  (PERCOCET/ROXICET) 5-325 MG tablet Take 1 tablet by mouth every 4 (four) hours as needed for severe pain. 04/13/21   Timothy Ford, MD  prazosin  (MINIPRESS ) 2 MG capsule Take 2 mg by mouth at bedtime.     [provider]  propranolol  (INNOPRAN  XL) 120 MG 24 hr capsule Take 120 mg by mouth in the morning and at bedtime.    [provider]  rizatriptan (MAXALT) 10 MG tablet Take 10 mg by mouth as needed for migraine. May repeat in 2 hours if needed    [provider]  Saw Palmetto  450 MG CAPS Take 450 mg by mouth in the morning, at noon, in the evening, and at bedtime.    [provider]  sodium chloride  1 g tablet Take 1 g by mouth 2 (two) times daily with a meal.    [provider]  traZODone  (DESYREL ) 150 MG tablet Take by mouth at bedtime.    [provider]      Allergies    Topamax [topiramate]    Review of Systems   Review of Systems  Physical Exam Updated Vital Signs BP (!) 178/90   Pulse 66   Temp 98 F (36.7 C)   Resp 17   Ht 5\' 7"  (1.702 m)   Wt 102.1 kg   SpO2 100%   BMI 35.24 kg/m  Physical Exam Vitals and nursing note reviewed.  Constitutional:      General: He is not in acute distress.    Appearance: He is well-developed. He is ill-appearing and diaphoretic.  HENT:     Head: Normocephalic and atraumatic.  Eyes:     Extraocular Movements: Extraocular movements intact.     Conjunctiva/sclera: Conjunctivae normal.     Pupils: Pupils are equal, round, and reactive to light.  Cardiovascular:     Rate and Rhythm: Normal rate and regular rhythm.      Heart sounds: No murmur heard. Pulmonary:     Effort: Pulmonary effort is normal. No respiratory distress.     Breath sounds: Normal breath sounds.  Abdominal:     Palpations: Abdomen is soft.     Tenderness: There is no abdominal tenderness.  Musculoskeletal:        General: No swelling. Normal range of motion.     Cervical back: Normal range of motion and neck supple.  Skin:    General: Skin is warm.     Capillary Refill: Capillary refill takes less than 2 seconds.  Neurological:     Mental Status: He is alert.  Psychiatric:        Mood and Affect: Mood normal.     ED Results / Procedures / Treatments   Labs (all labs ordered are listed, but only abnormal results are displayed) Labs Reviewed  I-STAT CHEM 8, ED - Abnormal; Notable for the following components:      Result Value   Sodium 129 (*)    Chloride 96 (*)    BUN 5 (*)    Calcium , Ion 1.12 (*)    All other components within normal limits  CBC WITH DIFFERENTIAL/PLATELET  PROTIME-INR  APTT  COMPREHENSIVE METABOLIC PANEL WITH GFR  LIPID PANEL  I-STAT CG4 LACTIC ACID, ED  TROPONIN I (HIGH SENSITIVITY)    EKG None  Radiology No results found.  Procedures .Critical Care  Performed by: Lowery Rue, DO Authorized by: Lowery Rue, DO   Critical care provider statement:    Critical care time (minutes):  35   Critical care was necessary to treat or prevent imminent or life-threatening deterioration of the following conditions: STEMI.   Critical care was time spent personally by me on the following activities:  Blood draw for specimens, development of treatment plan with patient or surrogate, discussions with primary provider, evaluation of patient's response to treatment, examination of patient, obtaining history from patient or surrogate, ordering and performing treatments and interventions, ordering and review of laboratory studies, ordering and review of radiographic studies, pulse oximetry,  re-evaluation of patient's condition and review of old charts   Care discussed with: admitting provider       Medications Ordered in ED Medications  heparin  injection 4,000 Units (4,000 Units Intravenous Given 07/02/23 1842)    ED Course/ Medical Decision Making/ A&P                                 Medical Decision Making Amount and/or Complexity of Data Reviewed Labs: ordered.  Risk Prescription drug management. Decision regarding hospitalization.   Vaughan Garfinkle [facility] is here with chest  pain.  Concern EKG in the field for inferior STEMI which was activated in the field.  Dr. Teofilo Fellers with cardiology at the bedside upon my evaluation.  He is hypertensive but otherwise normal vitals.  Pain started about 2 hours ago but unrelenting.  Got aspirin  already.  Denies any shortness of breath weakness numbness tingling.  No headache.  Overall will be taken to the Cath Lab.  EKG here shows ST elevations in inferior leads.  I reviewed interpreted EKG.  Lab work shows sodium of 129, hemoglobin of 17 creatinine of 0.8.  Lactic acid is 2.5.  Troponin pending at time of transfer to Cath Lab.  Patient given heparin  bolus and went with cardiology for catheterization.  This chart was dictated using voice recognition software.  Despite best efforts to proofread,  errors can occur which can change the documentation meaning.         Final Clinical Impression(s) / ED Diagnoses Final diagnoses:  ST elevation myocardial infarction (STEMI), unspecified artery [facility])    Rx / DC Orders ED Discharge Orders     None         Lowery Rue, DO 07/02/23 1610

## 2023-07-02 NOTE — ED Triage Notes (Signed)
 Pt bib ems with 2 hours of sudden onset chest pain, central radiating to L neck.  2, 3, avf with reciprocal changes in 1 and avl. 324mg  aspirin  before ems arrival on scene.  176/98 97%  HR 63

## 2023-07-02 NOTE — Progress Notes (Signed)
   07/02/23 1902  Spiritual Encounters  Type of Visit Initial  Care provided to: Pt and family  Reason for visit Code  OnCall Visit Yes   Chaplain responded to Code STEMI. Chaplain met with Patient and his wife and upon request, prayed with them as Patient was going to be undergoing surgery. No additional spiritual need at this time.  Chaplain Alphonzo Ask

## 2023-07-02 NOTE — Progress Notes (Signed)
 eLink Physician-Brief Progress Note Patient Name: Manuel Pearson DOB: 24-Apr-1965 MRN: [id number]   Date of Service  07/02/2023  HPI/Events of Note  58 year old with chronic back pain, PTSD and metabolic syndrome who presents with chest discomfort found to have an inferolateral STEMI admitted to the ICU for post-cath care.  Vital signs show hypertension but otherwise no abnormalities.  Laboratory studies show hyponatremia and mild troponin elevation.  Leukocytosis present.  Persistent inferior ST elevations noted on EKG.  Status post thrombectomy and PCI to the RCA.  eICU Interventions  Goal-directed medical therapy per cardiology  Dual antiplatelet therapy noted, risk factor modification  DVT prophylaxis with SCDs GI prophylaxis with home PPI     Intervention Category Evaluation Type: New Patient Evaluation  [name] 07/02/2023, 8:31 PM

## 2023-07-03 ENCOUNTER — Other Ambulatory Visit (HOSPITAL_COMMUNITY): Payer: Self-pay

## 2023-07-03 ENCOUNTER — Encounter (HOSPITAL_COMMUNITY): Payer: Self-pay | Admitting: Cardiology

## 2023-07-03 ENCOUNTER — Inpatient Hospital Stay (HOSPITAL_COMMUNITY)

## 2023-07-03 ENCOUNTER — Telehealth: Payer: Self-pay | Admitting: Physician Assistant

## 2023-07-03 DIAGNOSIS — I2510 Atherosclerotic heart disease of native coronary artery without angina pectoris: Secondary | ICD-10-CM

## 2023-07-03 DIAGNOSIS — Z720 Tobacco use: Secondary | ICD-10-CM | POA: Insufficient documentation

## 2023-07-03 DIAGNOSIS — I10 Essential (primary) hypertension: Secondary | ICD-10-CM | POA: Insufficient documentation

## 2023-07-03 DIAGNOSIS — I2119 ST elevation (STEMI) myocardial infarction involving other coronary artery of inferior wall: Secondary | ICD-10-CM

## 2023-07-03 DIAGNOSIS — E785 Hyperlipidemia, unspecified: Secondary | ICD-10-CM | POA: Insufficient documentation

## 2023-07-03 LAB — BASIC METABOLIC PANEL WITH GFR
Anion gap: 13 (ref 5–15)
BUN: 5 mg/dL — ABNORMAL LOW (ref 6–20)
CO2: 20 mmol/L — ABNORMAL LOW (ref 22–32)
Calcium: 8.6 mg/dL — ABNORMAL LOW (ref 8.9–10.3)
Chloride: 97 mmol/L — ABNORMAL LOW (ref 98–111)
Creatinine, Ser: 0.68 mg/dL (ref 0.61–1.24)
GFR, Estimated: >60 mL/min (ref >60)
Glucose, Bld: 93 mg/dL (ref 70–99)
Potassium: 3.7 mmol/L (ref 3.5–5.1)
Sodium: 130 mmol/L — ABNORMAL LOW (ref 135–145)

## 2023-07-03 LAB — ECHOCARDIOGRAM COMPLETE
Area-P 1/2: 3.05 cm2
Height: 67 in
S' Lateral: 3.5 cm
Weight: 3776 [oz_av]

## 2023-07-03 LAB — CBC
HCT: 43 % (ref 39.0–52.0)
Hemoglobin: 15.6 g/dL (ref 13.0–17.0)
MCH: 33.4 pg (ref 26.0–34.0)
MCHC: 36.3 g/dL — ABNORMAL HIGH (ref 30.0–36.0)
MCV: 92.1 fL (ref 80.0–100.0)
Platelets: 231 10*3/uL (ref 150–400)
RBC: 4.67 MIL/uL (ref 4.22–5.81)
RDW: 11.9 % (ref 11.5–15.5)
WBC: 11.6 10*3/uL — ABNORMAL HIGH (ref 4.0–10.5)
nRBC: 0 % (ref 0.0–0.2)

## 2023-07-03 MED ORDER — ASPIRIN 81 MG PO TBEC: 81.0000 mg | DELAYED_RELEASE_TABLET | Freq: Every day | ORAL | Status: DC

## 2023-07-03 MED ORDER — METOPROLOL TARTRATE 25 MG PO TABS
25.0000 mg | ORAL_TABLET | Freq: Two times a day (BID) | ORAL | 5 refills | Status: DC
  Filled 2023-07-03: qty 60, 30d supply, fill #0

## 2023-07-03 MED ORDER — ASPIRIN 81 MG PO TBEC
81.0000 mg | DELAYED_RELEASE_TABLET | Freq: Every day | ORAL | 11 refills | Status: DC
  Filled 2023-07-03: qty 30, 30d supply, fill #0

## 2023-07-03 MED ORDER — ATORVASTATIN CALCIUM 80 MG PO TABS
80.0000 mg | ORAL_TABLET | Freq: Every day | ORAL | 5 refills | Status: AC
Start: 2023-07-04 — End: ?
  Filled 2023-07-03: qty 30, 30d supply, fill #0

## 2023-07-03 MED ORDER — PRASUGREL HCL 10 MG PO TABS
10.0000 mg | ORAL_TABLET | Freq: Every day | ORAL | 11 refills | Status: AC
  Filled 2023-07-03: qty 30, 30d supply, fill #0

## 2023-07-03 MED ORDER — NITROGLYCERIN 0.4 MG SL SUBL
0.4000 mg | SUBLINGUAL_TABLET | SUBLINGUAL | 3 refills | Status: AC | PRN
  Filled 2023-07-03: qty 25, 5d supply, fill #0

## 2023-07-03 MED ORDER — PERFLUTREN LIPID MICROSPHERE
1.0000 mL | INTRAVENOUS | Status: AC | PRN
  Administered 2023-07-03: 2 mL via INTRAVENOUS

## 2023-07-03 MED FILL — Prasugrel HCl Tab 10 MG (Base Equiv): ORAL | Qty: 1 | Status: AC

## 2023-07-03 MED FILL — Prasugrel HCl Tab 10 MG (Base Equiv): ORAL | Qty: 1 | Status: CN

## 2023-07-03 NOTE — Telephone Encounter (Signed)
   Transition of Care Follow-up Phone Call Request    Patient Name: Manuel Pearson Date of Birth: [birth year] Date of Encounter: 07/03/2023  Primary Care Provider:  [facility] Primary Cardiologist:  Dr. Laura Polio has been scheduled for a transition of care follow up appointment with a [facility] provider:  [facility] 07/20/23 - appt is outside [facility] window but scheduled due to appt availability. Still requesting phone call as below.  Please reach out to Jan Olano [facility] within 48 hours of discharge to confirm appointment and review transition of care protocol questionnaire.  Anticipated discharge date: Later today  Manuel Straus N Aamya Orellana, PA-C  07/03/2023, 1:10 PM

## 2023-07-03 NOTE — Discharge Summary (Addendum)
 Discharge Summary    Patient ID: Manuel Pearson MRN: [id number]; DOB: 1965-11-19  Admit date: 07/02/2023 Discharge date: 07/03/2023  PCP:  Clinic, [facility] Providers Cardiologist:  Knox Perl, MD        Discharge Diagnoses    Principal Problem:   Acute MI inferior lateral first episode care [facility]) Active Problems:   CAD in native artery   Hyperlipidemia   Essential hypertension   Tobacco abuse    Diagnostic Studies/Procedures    Cath 07/02/23   LV end diastolic pressure is moderately elevated.   Recommend uninterrupted dual antiplatelet therapy with Aspirin  81mg  daily and Prasugrel  10mg  daily for a minimum of 12 months (ACS-Class I recommendation).   Hemodynamic data: LVEDP 21 mmHg.  There is no pressure gradient across the aortic valve.   Angiographic data: LM: Large-caliber vessel, smooth and normal. LAD: Large-caliber vessel, has tandem 30 to 40% stenosis in the proximal, proximal to mid, mid segments.  Distal LAD appears disease-free.  Small diagonals arise from LAD. LCx: Large-caliber vessel, gives origin to large OM 3 with a 60% mid stenosis. RCA: Dominant vessel, gives origin to large PDA and large PL branch.  There is a thrombotic and ulcerated proximal 80% stenosis.   Intervention data: Successful thrombectomy with Pronto aspiration catheter followed by stenting with a 4.0 x 32 mm Synergy XD DES at 14 atmospheric pressure, stenosis reduced from 80% to 0% with TIMI-3 to TIMI-3 flow.  Normal there is no evidence of edge dissection.   2D echo 07/03/23  1. Left ventricular ejection fraction, by estimation, is 55 to 60%. The  left ventricle has normal function. The left ventricle has no regional  wall motion abnormalities. Left ventricular diastolic parameters are  indeterminate.   2. Right ventricular systolic function is normal. The right ventricular  size is normal. Tricuspid regurgitation signal is inadequate for assessing  PA  pressure.   3. The mitral valve is normal in structure. Mild mitral valve  regurgitation.   4. The aortic valve has an indeterminant number of cusps. Aortic valve  regurgitation is not visualized.   5. The inferior vena cava is normal in size with greater than 50%  respiratory variability, suggesting right atrial pressure of 3 mmHg.  _____________   History of Present Illness     Manuel Pearson is a 58 y.o. 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 He developed this around 4:30pm the day of admission with radiation to his neck and left arm. Due to persistence of symptoms and intensity, he activated EMS and was found to have EKG abnormalities suggestive of inferolateral STEMI. He was brought to the ER and subsequently to the cath lab for emergent catheterization.  Hospital Course     1. Acute inferolateral STEMI. S/p PCI of proximal RCA with thrombectomy and stenting as above. Started on DAPT with plan to continue for 1 year. Home propranolol  transitioned to metoprolol . Started on atorvastatin . Echo with EF 55-60%, no RWMA, no significant abnormalities. Did great with cardiac rehab today with referral placed as outpatient.  2. Tobacco abuse. Counseled on smoking cessation. Reports 1/2 pk/day. Has tried nicotine  products/wellbutrin/chantix in past without success. Declines nicotine  patch at discharge.  3. HLD. LDL 101. Started on statin. If the patient is tolerating statin at time of follow-up appointment, would consider rechecking liver function/lipid panel in 6-8 weeks.  4. HTN.  On prazosin  for PTSD. On beta blocker as above. BP generally controlled, last BP 135/83. Follow for med titration if needed as outpatient.  5. Chronic hyponatremia. On salt tablets chronically. OK to continue per d/w Dr. Swaziland.   6. H/o Migraines:  Continue Aimovig. Patient was asked to  discuss alternatives to his Triptan with his neurologist.  7. Mild leukocytosis: possibly reactive to MI. Can consider recheck CBC as outpatient.  Dr. Swaziland has seen and examined the patient today and feels he is stable for discharge. 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 Outlined appt location on AVS. The patient was scheduled for a TOC follow up appointment, slightly outside window due to appt availability - will still launch phone note.     Did the patient have an acute coronary syndrome (MI, NSTEMI, STEMI, etc) this admission?:  Yes                               AHA/ACC ACS Clinical Performance & Quality Measures: Aspirin  prescribed? - Yes ADP Receptor Inhibitor (Plavix/Clopidogrel, Brilinta/Ticagrelor or Effient /Prasugrel ) prescribed (includes medically managed patients)? - Yes Beta Blocker prescribed? - Yes High Intensity Statin (Lipitor 40-80mg  or Crestor 20-40mg ) prescribed? - Yes EF assessed during THIS hospitalization? - Yes For EF <40%, was ACEI/ARB prescribed? - Not Applicable (EF >/= 40%) For EF <40%, Aldosterone Antagonist (Spironolactone or Eplerenone) prescribed? - Not Applicable (EF >/= 40%) Cardiac Rehab Phase II ordered (including medically managed patients)? - Yes     _____________  Discharge Vitals BP 128/83   Pulse 64   Temp 98.1 F (36.7 C) (Oral)   Resp 20   Ht 5\' 7"  (1.702 m)   Wt 107 kg   SpO2 93%   BMI 36.96 kg/m  Filed Weights   07/02/23 1831 07/02/23 1900  Weight: 102.1 kg 107 kg    Labs & Radiologic Studies    CBC Recent Labs    07/02/23 1841 07/02/23 1847 07/03/23 0347  WBC 11.3*  --  11.6*  NEUTROABS 6.5  --   --   HGB 16.8 17.0 15.6  HCT 47.8 50.0 43.0  MCV 94.1  --  92.1  PLT 257  --  231   Basic Metabolic Panel Recent Labs    62/83/15 1841 07/02/23 1847 07/03/23 0347  NA 130* 129* 130*  K 4.6 4.5 3.7  CL 94* 96* 97*  CO2 22  --  20*  GLUCOSE 98 98  93  BUN 6 5* 5*  CREATININE 0.86 0.80 0.68  CALCIUM  9.5  --  8.6*   Liver Function Tests Recent Labs    07/02/23 1841  AST 37  ALT 34  ALKPHOS 78  BILITOT 0.8  PROT 6.7  ALBUMIN 3.8   No results for input(s): "LIPASE", "AMYLASE" in the last 72 hours. High Sensitivity Troponin:   Recent Labs  Lab 07/02/23 1841  TROPONINIHS 89*    Fasting Lipid Panel Recent Labs    07/02/23 1841  CHOL 156  HDL 34*  LDLCALC 101*  TRIG 103  CHOLHDL 4.6   Thyroid Function Tests No results for input(s): "TSH", "T4TOTAL", "T3FREE", "THYROIDAB" in the last 72 hours.  Invalid input(s): "FREET3" _____________  ECHOCARDIOGRAM COMPLETE Result Date: 07/03/2023    ECHOCARDIOGRAM REPORT   Patient Name:   Manuel Pearson Date of Exam: 07/03/2023 Medical Rec #:  [id number]  Height:       67.0 in Accession #:    [id number]    Weight:       236.0 lb Date of Birth:  05/14/1965     BSA:          2.170 m Patient Age:    58 years      BP:           138/86 mmHg Patient Gender: M             HR:           59 bpm. Exam Location:  Inpatient Procedure: 2D Echo, Color Doppler, Cardiac Doppler and Intracardiac            Opacification Agent (Both Spectral and Color Flow Doppler were            utilized during procedure). Indications:    CAD Native Vessel i25.10  History:        Patient has no prior history of Echocardiogram examinations.                 Risk Factors:Hypertension and Sleep Apnea.  Sonographer:    Sherline Distel Senior RDCS Referring Phys: [id number] Knox Perl IMPRESSIONS  1. Left ventricular ejection fraction, by estimation, is 55 to 60%. The left ventricle has normal function. The left ventricle has no regional wall motion abnormalities. Left ventricular diastolic parameters are indeterminate.  2. Right ventricular systolic function is normal. The right ventricular size is normal. Tricuspid regurgitation signal is inadequate for assessing PA pressure.  3. The mitral valve is normal in structure. Mild mitral valve  regurgitation.  4. The aortic valve has an indeterminant number of cusps. Aortic valve regurgitation is not visualized.  5. The inferior vena cava is normal in size with greater than 50% respiratory variability, suggesting right atrial pressure of 3 mmHg. FINDINGS  Left Ventricle: Left ventricular ejection fraction, by estimation, is 55 to 60%. The left ventricle has normal function. The left ventricle has no regional wall motion abnormalities. Definity  contrast agent was given IV to delineate the left ventricular  endocardial borders. The left ventricular internal cavity size was normal in size. There is no left ventricular hypertrophy. Left ventricular diastolic parameters are indeterminate. Right Ventricle: The right ventricular size is normal. No increase in right ventricular wall thickness. Right ventricular systolic function is normal. Tricuspid regurgitation signal is inadequate for assessing PA pressure. Left Atrium: Left atrial size was normal in size. Right Atrium: Right atrial size was normal in size. Pericardium: There is no evidence of pericardial effusion. Presence of epicardial fat layer. Mitral Valve: The mitral valve is normal in structure. Mild mitral valve regurgitation. Tricuspid Valve: The tricuspid valve is normal in structure. Tricuspid valve regurgitation is trivial. Aortic Valve: The aortic valve has an indeterminant number of cusps. Aortic valve regurgitation is not visualized. Pulmonic Valve: The pulmonic valve was normal in structure. Pulmonic valve regurgitation is trivial. Aorta: The aortic root and ascending aorta are structurally normal, with no evidence of dilitation. Venous: The inferior vena cava is normal in size with greater than 50% respiratory variability, suggesting right atrial pressure of 3 mmHg. IAS/Shunts: The atrial septum is grossly normal.  LEFT VENTRICLE PLAX 2D LVIDd:         4.90 cm   Diastology LVIDs:         3.50 cm   LV e' medial:    5.00 cm/s LV PW:         1.10  cm  LV E/e' medial:  10.1 LV IVS:        0.80 cm   LV e' lateral:   6.42 cm/s LVOT diam:     2.10 cm   LV E/e' lateral: 7.9 LV SV:         72 LV SV Index:   33 LVOT Area:     3.46 cm  RIGHT VENTRICLE RV S prime:     10.10 cm/s TAPSE (M-mode): 1.8 cm LEFT ATRIUM             Index        RIGHT ATRIUM           Index LA diam:        3.20 cm 1.47 cm/m   RA Area:     18.90 cm LA Vol (A2C):   65.4 ml 30.14 ml/m  RA Volume:   50.90 ml  23.46 ml/m LA Vol (A4C):   66.5 ml 30.65 ml/m LA Biplane Vol: 70.0 ml 32.26 ml/m  AORTIC VALVE LVOT Vmax:   105.00 cm/s LVOT Vmean:  68.900 cm/s LVOT VTI:    0.207 m  AORTA Ao Root diam: 3.50 cm Ao Asc diam:  3.60 cm MITRAL VALVE MV Area (PHT): 3.05 cm    SHUNTS MV Decel Time: 249 msec    Systemic VTI:  0.21 m MV E velocity: 50.60 cm/s  Systemic Diam: 2.10 cm MV A velocity: 64.20 cm/s MV E/A ratio:  0.79 Arta Lark Electronically signed by Arta Lark Signature Date/Time: 07/03/2023/11:59:09 AM    Final    CARDIAC CATHETERIZATION Result Date: 07/02/2023 Images from the original result were not included.   LV end diastolic pressure is moderately elevated.   Recommend uninterrupted dual antiplatelet therapy with Aspirin  81mg  daily and Prasugrel  10mg  daily for a minimum of 12 months (ACS-Class I recommendation). Cardiac Catheterization 07/02/23: Hemodynamic data: LVEDP 21 mmHg.  There is no pressure gradient across the aortic valve. Angiographic data: LM: Large-caliber vessel, smooth and normal. LAD: Large-caliber vessel, has tandem 30 to 40% stenosis in the proximal, proximal to mid, mid segments.  Distal LAD appears disease-free.  Small diagonals arise from LAD. LCx: Large-caliber vessel, gives origin to large OM 3 with a 60% mid stenosis. RCA: Dominant vessel, gives origin to large PDA and large PL branch.  There is a thrombotic and ulcerated proximal 80% stenosis. Intervention data: Successful thrombectomy with Pronto aspiration catheter followed by stenting with a 4.0 x  32 mm Synergy XD DES at 14 atmospheric pressure, stenosis reduced from 80% to 0% with TIMI-3 to TIMI-3 flow.  Normal there is no evidence of edge dissection. Impression and recommendations: DAPT for 12 months with aspirin  and prasugrel  10 mg for at least 1 year.  Aggressive risk factor modification.   Disposition   Pt is being discharged home today in good condition.  Follow-up Plans & Appointments     Follow-up Information     Palmer Bobo, NP Follow up.   Why: ** IMPORTANT - please disregard [facility] address listed below. Your appointment is at [address] **  You have a follow-up appointment with a provider at [facility] in [place] on Thursday Jul 20, 2023 at 8:25 AM (arrive 15 minutes early to check in).  We are currently in the process of transitioning from two locations to one.  Effective July 10, 2023, all appointments that were previously scheduled at either our [facility] or [facility] locations will be moved to our new location at [employer]   [address] [place], [place], [address]. Contact information: [facility] [phone]               Discharge Instructions     Amb Referral to Cardiac Rehabilitation   Complete by: As directed    Diagnosis:  Coronary Stents STEMI     After initial evaluation and assessments completed: Virtual Based Care may be provided alone or in conjunction with Phase 2 Cardiac Rehab based on patient barriers.: Yes   Intensive Cardiac Rehabilitation (ICR) [facility] location only OR Traditional Cardiac Rehabilitation (TCR) *If criteria for ICR are not met will enroll in TCR [facility] only): Yes   Diet - low sodium heart healthy   Complete by: As directed    Discharge instructions   Complete by: As directed    Please see your medicine list for the several changes made here in the hospital. Your new medicines include aspirin , atorvastatin , metoprolol , nitroglycerin , and prasugrel  (Effient ).  Fioricet  (butalbital /acetaminophen /caffeine ) and methylprednisolone  were removed from your medicine list since you indicated you no longer take these.  Your propranolol  was switched to metoprolol  here in the hospital given your heart attack.  Regarding your rizatriptan/Maxalt prescription, this medicine is not ideal to use in patients who have had heart problems. Dr. Swaziland recommends you discuss alternatives with your neurologist.   Increase activity slowly   Complete by: As directed    No driving for 2 weeks from a cardiac standpoint. Do not resume driving if you have been told not to drive for other non-heart reasons in the past. No lifting over 10 lbs for 4 weeks. No sexual activity for 4 weeks. Keep procedure site clean & dry. If you notice increased pain, swelling, bleeding or pus, call/return!  You may shower, but no soaking baths/hot tubs/pools for 1 week.        Discharge Medications   Allergies as of 07/03/2023       Reactions   Rizatriptan Rash   Topamax [topiramate]    Touretts symptoms        Medication List     STOP taking these medications    methylPREDNISolone  4 MG Tbpk tablet Commonly known as: MEDROL  DOSEPAK   propranolol  120 MG 24 hr capsule Commonly known as: INNOPRAN  XL   rizatriptan 10 MG tablet Commonly known as: MAXALT       TAKE these medications    Aimovig 70 MG/ML Soaj Generic drug: Erenumab-aooe Inject into the skin every 30 (thirty) days.   aspirin  EC 81 MG tablet Take 1 tablet (81 mg total) by mouth daily. Swallow whole. Start taking on: July 04, 2023   atorvastatin  80 MG tablet Commonly known as: LIPITOR Take 1 tablet (80 mg total) by mouth daily. Start taking on: July 04, 2023   cyclobenzaprine  10 MG tablet Commonly known as: FLEXERIL  Take 10 mg by mouth at bedtime.   ezetimibe 10 MG tablet Commonly known as: ZETIA Take 10 mg by mouth daily.   HYDROcodone -acetaminophen  5-325 MG tablet Commonly known as: NORCO/VICODIN Take 1  tablet by mouth every 4 (four) hours as needed for moderate pain.   levETIRAcetam  750 MG tablet Commonly known as: KEPPRA  Take 750 mg by mouth 4 (four) times daily.   levothyroxine  50 MCG tablet Commonly known as: SYNTHROID  Take 50 mcg by mouth daily before breakfast.   loratadine  10 MG tablet Commonly known as: CLARITIN  Take 10 mg by mouth daily at 12 noon.   metoprolol  tartrate 25 MG tablet Commonly known as: LOPRESSOR  Take 1 tablet (25  mg total) by mouth 2 (two) times daily.   multivitamin with minerals Tabs tablet Take 1 tablet by mouth daily.   nitroGLYCERIN  0.4 MG SL tablet Commonly known as: Nitrostat  Place 1 tablet (0.4 mg total) under the tongue every 5 (five) minutes as needed for chest pain (up to 3 doses. If taking 3rd dose call 911).   omeprazole 20 MG capsule Commonly known as: PRILOSEC Take 20 mg by mouth daily.   OXcarbazepine  150 MG tablet Commonly known as: TRILEPTAL  Take 225-450 mg by mouth See admin instructions. PATIENT TAKES 225 MG by mouth  TID AND 450 MG by mouth  AT BEDTIME PER PATIENT   prasugrel  10 MG Tabs tablet Commonly known as: EFFIENT  Take 1 tablet (10 mg total) by mouth daily. Start taking on: July 04, 2023   prazosin  2 MG capsule Commonly known as: MINIPRESS  Take 2 mg by mouth at bedtime.   sodium chloride  1 g tablet Take 1 g by mouth 2 (two) times daily with a meal.   traZODone  150 MG tablet Commonly known as: DESYREL  Take 75 mg by mouth at bedtime.           Outstanding Labs/Studies   N/A  Duration of Discharge Encounter: APP Time: 18 minutes   Signed, Genavive Kubicki N Anola Mcgough, PA-C 07/03/2023, 1:39 PM

## 2023-07-03 NOTE — Progress Notes (Signed)
 Echocardiogram 2D Echocardiogram has been performed.  Manuel Pearson RDCS 07/03/2023, 11:57 AM

## 2023-07-03 NOTE — Progress Notes (Signed)
 Rounding Note    Patient Name: Manuel Pearson Date of Encounter: 07/03/2023  [facility] Cardiologist: None new- [place]  Subjective   Feels well this am. No chest pain or dyspnea.  Inpatient Medications    Scheduled Meds:  aspirin   81 mg Oral Daily   atorvastatin   80 mg Oral Daily   Chlorhexidine  Gluconate Cloth  6 each Topical Daily   cyclobenzaprine   10 mg Oral QHS   levETIRAcetam   750 mg Oral QID   levothyroxine   50 mcg Oral QAC breakfast   metoprolol  tartrate  25 mg Oral BID   nicotine   14 mg Transdermal Daily   OXcarbazepine   225 mg Oral TID with meals   OXcarbazepine   450 mg Oral QHS   pantoprazole   40 mg Oral Daily   prasugrel   10 mg Oral Daily   prazosin   2 mg Oral QHS   sodium chloride   1 g Oral BID WC   traZODone   50 mg Oral QHS   Continuous Infusions:  sodium chloride      PRN Meds: sodium chloride , acetaminophen , butalbital -acetaminophen -caffeine , ondansetron  (ZOFRAN ) IV, sodium chloride  flush   Vital Signs    Vitals:   07/03/23 0600 07/03/23 0630 07/03/23 0700 07/03/23 0800  BP: (!) 142/82 136/73 (!) 145/121 (!) 126/113  Pulse: 63 (!) 57 (!) 59 67  Resp: 14 19 (!) 9 17  Temp:      TempSrc:      SpO2: 98% 96% 97% 97%  Weight:      Height:        Intake/Output Summary (Last 24 hours) at 07/03/2023 0856 Last data filed at 07/03/2023 0600 Gross per 24 hour  Intake 221 ml  Output 915 ml  Net -694 ml      07/02/2023    7:00 PM 07/02/2023    6:31 PM 10/24/2022    3:47 PM  Last 3 Weights  Weight (lbs) 236 lb 225 lb 225 lb 5 oz  Weight (kg) 107.049 kg 102.059 kg 102.2 kg      Telemetry    NSR- rare PVC couplet - Personally Reviewed  ECG    NSR - ST elevation resolved post PCI - Personally Reviewed  Physical Exam   GEN: No acute distress.   Neck: No JVD Cardiac: RRR, no murmurs, rubs, or gallops.  Respiratory: Clear to auscultation bilaterally. GI: Soft, nontender, non-distended  MS: No edema; No deformity. Radial site  OK Neuro:  Nonfocal  Psych: Normal affect   Labs    High Sensitivity Troponin:   Recent Labs  Lab 07/02/23 1841  TROPONINIHS 89*     Chemistry Recent Labs  Lab 07/02/23 1841 07/02/23 1847 07/03/23 0347  NA 130* 129* 130*  K 4.6 4.5 3.7  CL 94* 96* 97*  CO2 22  --  20*  GLUCOSE 98 98 93  BUN 6 5* 5*  CREATININE 0.86 0.80 0.68  CALCIUM  9.5  --  8.6*  PROT 6.7  --   --   ALBUMIN 3.8  --   --   AST 37  --   --   ALT 34  --   --   ALKPHOS 78  --   --   BILITOT 0.8  --   --   GFRNONAA >60  --  >60  ANIONGAP 14  --  13    Lipids  Recent Labs  Lab 07/02/23 1841  CHOL 156  TRIG 103  HDL 34*  LDLCALC 101*  CHOLHDL 4.6  Hematology Recent Labs  Lab 07/02/23 1841 07/02/23 1847 07/03/23 0347  WBC 11.3*  --  11.6*  RBC 5.08  --  4.67  HGB 16.8 17.0 15.6  HCT 47.8 50.0 43.0  MCV 94.1  --  92.1  MCH 33.1  --  33.4  MCHC 35.1  --  36.3*  RDW 11.9  --  11.9  PLT 257  --  231   Thyroid No results for input(s): "TSH", "FREET4" in the last 168 hours.  BNPNo results for input(s): "BNP", "PROBNP" in the last 168 hours.  DDimer No results for input(s): "DDIMER" in the last 168 hours.   Radiology    CARDIAC CATHETERIZATION Result Date: 07/02/2023 Images from the original result were not included.   LV end diastolic pressure is moderately elevated.   Recommend uninterrupted dual antiplatelet therapy with Aspirin  81mg  daily and Prasugrel  10mg  daily for a minimum of 12 months (ACS-Class I recommendation). Cardiac Catheterization 07/02/23: Hemodynamic data: LVEDP 21 mmHg.  There is no pressure gradient across the aortic valve. Angiographic data: LM: Large-caliber vessel, smooth and normal. LAD: Large-caliber vessel, has tandem 30 to 40% stenosis in the proximal, proximal to mid, mid segments.  Distal LAD appears disease-free.  Small diagonals arise from LAD. LCx: Large-caliber vessel, gives origin to large OM 3 with a 60% mid stenosis. RCA: Dominant vessel, gives origin to  large PDA and large PL branch.  There is a thrombotic and ulcerated proximal 80% stenosis. Intervention data: Successful thrombectomy with Pronto aspiration catheter followed by stenting with a 4.0 x 32 mm Synergy XD DES at 14 atmospheric pressure, stenosis reduced from 80% to 0% with TIMI-3 to TIMI-3 flow.  Normal there is no evidence of edge dissection. Impression and recommendations: DAPT for 12 months with aspirin  and prasugrel  10 mg for at least 1 year.  Aggressive risk factor modification.    Cardiac Studies   Echo pending  Patient Profile     58 y.o. male with PTSD, tobacco abuse, HTN, HLD presented with acute inferolateral STEMI  Assessment & Plan    Acute inferolateral STEMI. S/p PCI of proximal RCA with thrombectomy and stenting. On DAPT for one year. On beta blocker and high dose statin. Will check Echo today. Ambulate with cardiac rehab. Possible DC later today if no complications Tobacco abuse. Counseled on smoking cessation. Reports 1/2 pk/day. Has tried nicotine  products/wellbutrin/chantix in past without success HLD. On high dose lipitor. LDL 101. Repeat lipids in 2 months HTN. On prazosin  for PTSD. On metoprolol       For questions or updates, please contact [facility] Please consult [email or web address] for contact info under        Signed, Rani Idler Sw[place], MD  07/03/2023, 8:56 AM

## 2023-07-03 NOTE — Plan of Care (Signed)

## 2023-07-03 NOTE — Progress Notes (Signed)
 CARDIAC REHAB PHASE I   PRE:  Rate/Rhythm: 55 SB   BP:  Sitting: 146/80      SaO2: 97 RA   MODE:  Ambulation: 370 ft   POST:  Rate/Rhythm: 70 SR   BP:  Sitting: 135/83      SaO2: 98 RA   Pt ambulated independently in hallway. Tolerated well with no CP, SOB or dizziness. Returned to bed with call bell and bedside table in reach. Post MI/stent education including restrictions, risk factors, exercise guidelines, antiplatelet therapy importance, MI booklet, NTG use, heart healthy diet, smoking cessation and CRP2 reviewed. All questions and concerns addressed. Will refer to [facility] for CRP2. Will continue to follow.   [id number] Ronny Colas, RN BSN 07/03/2023 11:51 AM

## 2023-07-04 LAB — LIPOPROTEIN A (LPA): Lipoprotein (a): 18.2 nmol/L (ref <75.0)

## 2023-07-04 NOTE — Telephone Encounter (Signed)
 Patient identification verified by 2 forms. Hilton Lucky, RN    Patient contacted regarding discharge from [facility] on 07/03/23.  Patient understands to follow up with provider NP [place] on 07/20/23 at 8:25am at [facility]. Patient understands discharge instructions? Yes Patient understands medications and regiment? Yes Patient understands to bring all medications to this visit? Yes

## 2023-07-11 ENCOUNTER — Telehealth (HOSPITAL_COMMUNITY): Payer: Self-pay

## 2023-07-11 NOTE — Telephone Encounter (Signed)
 Called and spoke with pt in regards to CR, pt stated he is not interested at this time due to back issues.   Closed referral

## 2023-07-20 ENCOUNTER — Ambulatory Visit: Attending: Emergency Medicine | Admitting: Emergency Medicine

## 2023-07-20 ENCOUNTER — Encounter: Payer: Self-pay | Admitting: Emergency Medicine

## 2023-07-20 VITALS — BP 108/68 | HR 66 | Ht 67.0 in | Wt 237.2 lb

## 2023-07-20 DIAGNOSIS — I10 Essential (primary) hypertension: Secondary | ICD-10-CM | POA: Insufficient documentation

## 2023-07-20 DIAGNOSIS — I2510 Atherosclerotic heart disease of native coronary artery without angina pectoris: Secondary | ICD-10-CM | POA: Diagnosis present

## 2023-07-20 DIAGNOSIS — I2119 ST elevation (STEMI) myocardial infarction involving other coronary artery of inferior wall: Secondary | ICD-10-CM | POA: Insufficient documentation

## 2023-07-20 DIAGNOSIS — E785 Hyperlipidemia, unspecified: Secondary | ICD-10-CM | POA: Insufficient documentation

## 2023-07-20 DIAGNOSIS — Z720 Tobacco use: Secondary | ICD-10-CM | POA: Insufficient documentation

## 2023-07-20 NOTE — Progress Notes (Signed)
 Cardiology Office Note:    Date:  07/20/2023  ID:  Manuel Pearson, DOB 07/03/1965, MRN [id number] PCP: [facility]  [facility] Providers Cardiologist:  Knox Perl, MD       Patient Profile:      Chief Complaint: Hospital follow-up for STEMI/CAD History of Present Illness:  Manuel Pearson is a 58 y.o. 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  EMS was activated he was found to have EKG abnormality suggestive of an inferior lateral STEMI.  He was brought to the ER and subsequently to the Cath Lab for emergent catheterization.  He underwent PCI/DES of proximal RCA with thrombectomy.  Started on DAPT with plan to continue for 1 year.  His home propranolol  was transition to metoprolol .  He was started on atorvastatin .  Echocardiogram showed LVEF 55-60%, no RWMA, no significant abnormalities.   Discussed the use of AI scribe software for clinical note transcription with the patient, who gave verbal consent to proceed.  History of Present Illness Manuel Pearson "Consuelo Denmark" is a 58 year old male with a recent myocardial infarction who presents for follow-up after stent placement.  He presents to clinic today with his wife.  He notes he is doing well overall without acute cardiovascular concerns or complaints.  Notes that he is doing "great".  He denies any chest pains.  He denies prior anginal equivalent and is without any anginal symptoms.  No issues to right radial cath site.  He notes that he is not too interested in cardiac rehab.  His physical activity has been limited due to his back pain in the past.  He has history of PTSD and is worried that switching off of propranolol  is going to cause him extra anxiety.  He denies chest pain, shortness of breath, lower extremity edema,  fatigue, palpitations, melena, hematuria, hemoptysis, diaphoresis, weakness, presyncope, syncope, orthopnea, and PND.   Review of systems:  Please see the history of present illness. All other systems are reviewed and otherwise negative.     Home Medications:    Current Meds  Medication Sig   aspirin  EC 81 MG tablet Take 1 tablet (81 mg total) by mouth daily. Swallow whole.   atorvastatin  (LIPITOR) 80 MG tablet Take 1 tablet (80 mg total) by mouth daily.   cyclobenzaprine  (FLEXERIL ) 10 MG tablet Take 10 mg by mouth at bedtime.   Erenumab-aooe (AIMOVIG) 70 MG/ML SOAJ Inject into the skin every 30 (thirty) days.   ezetimibe (ZETIA) 10 MG tablet Take 10 mg by mouth daily.   HYDROcodone -acetaminophen  (NORCO/VICODIN) 5-325 MG tablet Take 1 tablet by mouth every 4 (four) hours as needed for moderate pain.   levETIRAcetam  (KEPPRA ) 750 MG tablet Take 750 mg by mouth 4 (four) times daily.   levothyroxine  (SYNTHROID , LEVOTHROID) 50 MCG tablet Take 50 mcg by mouth daily before breakfast.   loratadine  (CLARITIN ) 10 MG tablet Take 10 mg by mouth daily at 12 noon.   metoprolol  tartrate (LOPRESSOR ) 25 MG tablet Take 1 tablet (25 mg total) by mouth 2 (two) times daily.   Multiple Vitamin (MULITIVITAMIN WITH MINERALS) TABS Take 1 tablet by mouth daily.    nitroGLYCERIN  (NITROSTAT ) 0.4 MG SL tablet Place 1 tablet (0.4 mg total) under the tongue every 5 (five) minutes as needed for chest pain (up to 3 doses. If  taking 3rd dose call 911).   omeprazole (PRILOSEC) 20 MG capsule Take 20 mg by mouth daily.   OXcarbazepine  (TRILEPTAL ) 150 MG tablet Take 225-450 mg by mouth See admin instructions. PATIENT TAKES 225 MG by mouth  TID AND 450 MG by mouth  AT BEDTIME PER PATIENT   prasugrel  (EFFIENT ) 10 MG TABS tablet Take 1 tablet (10 mg total) by mouth daily.   prazosin  (MINIPRESS ) 2 MG capsule Take 2 mg by mouth at bedtime.    sodium chloride  1 g tablet Take 1 g by mouth 2 (two) times daily with a meal.   traZODone   (DESYREL ) 150 MG tablet Take 75 mg by mouth at bedtime.         Studies Reviewed:   Echocardiogram 07/03/2023 1. Left ventricular ejection fraction, by estimation, is 55 to 60%. The  left ventricle has normal function. The left ventricle has no regional  wall motion abnormalities. Left ventricular diastolic parameters are  indeterminate.   2. Right ventricular systolic function is normal. The right ventricular  size is normal. Tricuspid regurgitation signal is inadequate for assessing  PA pressure.   3. The mitral valve is normal in structure. Mild mitral valve  regurgitation.   4. The aortic valve has an indeterminant number of cusps. Aortic valve  regurgitation is not visualized.   5. The inferior vena cava is normal in size with greater than 50%  respiratory variability, suggesting right atrial pressure of 3 mmHg.   Cardiac catheterization 07/02/2023 Intervention data: Successful thrombectomy with Pronto aspiration catheter followed by stenting with a 4.0 x 32 mm Synergy XD DES at 14 atmospheric pressure, stenosis reduced from 80% to 0% with TIMI-3 to TIMI-3 flow.  Normal there is no evidence of edge dissection. Diagnostic Dominance: Right  Intervention    Risk Assessment/Calculations:             Physical Exam:   VS:  BP 108/68 (BP Location: Left Arm, Patient Position: Sitting)   Pulse 66   Ht 5\' 7"  (1.702 m)   Wt 237 lb 3.2 oz (107.6 kg)   SpO2 96%   BMI 37.15 kg/m    Wt Readings from Last 3 Encounters:  07/20/23 237 lb 3.2 oz (107.6 kg)  07/02/23 236 lb (107 kg)  10/24/22 225 lb 5 oz (102.2 kg)    GEN: Well nourished, well developed in no acute distress NECK: No JVD; No carotid bruits CARDIAC: RRR, no murmurs, rubs, gallops RESPIRATORY:  Clear to auscultation without rales, wheezing or rhonchi  ABDOMEN: Soft, non-tender, non-distended EXTREMITIES:  No edema; No acute deformity     Assessment and Plan:  Coronary artery disease / Inferolateral STEMI S/p PCI  of proximal RCA with thrombectomy and stenting Echocardiogram shows LVEF 55-60%, no RWMA, no significant abnormalities - Today patient is without any anginal symptoms, no indication for further ischemic evaluation at this time - EKG without acute ischemic changes  - Right radial cath site healing appropriately without bruit, hematoma, swelling - Continue DAPT for 12 months with aspirin  81 mg daily and prasugrel  10 mg daily - Continue atorvastatin  80 mg daily, ezetimibe 10 mg daily, metoprolol  tartrate 25 mg twice daily - CBC and BMET today  Tobacco abuse Smokes half a pack a day - Complete cessation encouraged  Hyperlipidemia Lipoprotein (A) 18.2 on 06/2023 LDL 101, HDL 34, TG 103 on 06/2023 His LDL was above goal of less than 70, however started on atorvastatin  during recent admission - Repeat fasting lipid panel and LFTs ordered  today to be completed in 6-8 weeks - Continue atorvastatin  80 mg daily  Hypertension Blood pressure today is 108/68 and under excellent control On prazosin  for PTSD - Continue current hypertensive regimen  Chronic hyponatremia - On salt tablets chronically      Dispo:  Return in about 3 months (around 10/20/2023).  Signed, Ava Boatman, NP

## 2023-07-20 NOTE — Patient Instructions (Signed)
 Medication Instructions:  CONTINUE CURRENT MEDICATION THERAPY.   Lab Work: Nutritional therapist AND CBC TO BE DONE TODAY. FASTING LIPID PANEL AND LFTs TO BE DONE IN 4-6 WEEKS.   Testing/Procedures: NONE  Follow-Up: At [facility], you and your health needs are our priority.  As part of our continuing mission to provide you with exceptional heart care, our providers are all part of one team.  This team includes your primary Cardiologist (physician) and Advanced Practice Providers or APPs (Physician Assistants and Nurse Practitioners) who all work together to provide you with the care you need, when you need it.  Your next appointment:   3 MONTHS  Provider:   MADISON FOUNTAIN, DNP

## 2023-07-21 LAB — CBC
Hematocrit: 47.3 % (ref 37.5–51.0)
Hemoglobin: 16.3 g/dL (ref 13.0–17.7)
MCH: 33.1 pg — ABNORMAL HIGH (ref 26.6–33.0)
MCHC: 34.5 g/dL (ref 31.5–35.7)
MCV: 96 fL (ref 79–97)
Platelets: 315 10*3/uL (ref 150–450)
RBC: 4.93 x10E6/uL (ref 4.14–5.80)
RDW: 12.3 % (ref 11.6–15.4)
WBC: 10.7 10*3/uL (ref 3.4–10.8)

## 2023-07-21 LAB — BASIC METABOLIC PANEL WITH GFR
BUN/Creatinine Ratio: 9 (ref 9–20)
BUN: 8 mg/dL (ref 6–24)
CO2: 20 mmol/L (ref 20–29)
Calcium: 10 mg/dL (ref 8.7–10.2)
Chloride: 92 mmol/L — ABNORMAL LOW (ref 96–106)
Creatinine, Ser: 0.89 mg/dL (ref 0.76–1.27)
Glucose: 98 mg/dL (ref 70–99)
Potassium: 5.4 mmol/L — ABNORMAL HIGH (ref 3.5–5.2)
Sodium: 129 mmol/L — ABNORMAL LOW (ref 134–144)
eGFR: 99 mL/min/{1.73_m2} (ref >59)

## 2023-07-25 ENCOUNTER — Ambulatory Visit: Payer: Self-pay | Admitting: Emergency Medicine

## 2023-07-25 DIAGNOSIS — I10 Essential (primary) hypertension: Secondary | ICD-10-CM

## 2023-07-25 DIAGNOSIS — I2510 Atherosclerotic heart disease of native coronary artery without angina pectoris: Secondary | ICD-10-CM

## 2023-07-25 DIAGNOSIS — E785 Hyperlipidemia, unspecified: Secondary | ICD-10-CM

## 2023-08-01 LAB — BASIC METABOLIC PANEL WITH GFR
BUN/Creatinine Ratio: 9 (ref 9–20)
BUN: 8 mg/dL (ref 6–24)
CO2: 22 mmol/L (ref 20–29)
Calcium: 10.1 mg/dL (ref 8.7–10.2)
Chloride: 95 mmol/L — ABNORMAL LOW (ref 96–106)
Creatinine, Ser: 0.85 mg/dL (ref 0.76–1.27)
Glucose: 102 mg/dL — ABNORMAL HIGH (ref 70–99)
Potassium: 5.5 mmol/L — ABNORMAL HIGH (ref 3.5–5.2)
Sodium: 134 mmol/L (ref 134–144)
eGFR: 100 mL/min/{1.73_m2} (ref >59)

## 2023-08-10 LAB — BASIC METABOLIC PANEL WITH GFR
BUN/Creatinine Ratio: 10 (ref 9–20)
BUN: 9 mg/dL (ref 6–24)
CO2: 20 mmol/L (ref 20–29)
Calcium: 9.8 mg/dL (ref 8.7–10.2)
Chloride: 96 mmol/L (ref 96–106)
Creatinine, Ser: 0.88 mg/dL (ref 0.76–1.27)
Glucose: 92 mg/dL (ref 70–99)
Potassium: 5.1 mmol/L (ref 3.5–5.2)
Sodium: 134 mmol/L (ref 134–144)
eGFR: 99 mL/min/{1.73_m2} (ref >59)

## 2023-08-11 ENCOUNTER — Telehealth (HOSPITAL_COMMUNITY): Payer: Self-pay

## 2023-08-11 NOTE — Telephone Encounter (Signed)
 Recv'ed [facility] authorization for CR, contact and spoke with pt who stated he is interested.   Will reopen referral and pass to RN for review.

## 2023-08-11 NOTE — Telephone Encounter (Signed)
 Outside/paper referral received by Dr. Debbora Fair from the [facility]. Will fax over Physician order and request further documents. Insurance benefits and eligibility to be determined.

## 2023-08-11 NOTE — Telephone Encounter (Signed)
 Pt is covered thru the [place], [id number].

## 2023-08-16 ENCOUNTER — Telehealth (HOSPITAL_COMMUNITY): Payer: Self-pay

## 2023-08-16 NOTE — Telephone Encounter (Signed)
 Patient called stating he was interested in participating in the Cardiac Rehab Program. Patient will come in for orientation on 6/17 and will attend the 10:15 exercise class.  Sent MyChart message.

## 2023-08-28 ENCOUNTER — Telehealth (HOSPITAL_COMMUNITY): Payer: Self-pay

## 2023-08-28 NOTE — Telephone Encounter (Signed)
 Confirmed appt for Cardiac Rehab and completed health assessment/history with pt via phone.

## 2023-08-29 ENCOUNTER — Encounter (HOSPITAL_COMMUNITY)
Admission: RE | Admit: 2023-08-29 | Discharge: 2023-08-29 | Disposition: A | Discharge status: Home / Self Care | Source: Ambulatory Visit | Attending: Cardiology | Admitting: Cardiology

## 2023-08-29 ENCOUNTER — Encounter (HOSPITAL_COMMUNITY)

## 2023-08-29 VITALS — BP 138/72 | HR 70 | Ht 67.75 in | Wt 238.8 lb

## 2023-08-29 DIAGNOSIS — Z955 Presence of coronary angioplasty implant and graft: Secondary | ICD-10-CM | POA: Diagnosis present

## 2023-08-29 DIAGNOSIS — I2111 ST elevation (STEMI) myocardial infarction involving right coronary artery: Secondary | ICD-10-CM | POA: Insufficient documentation

## 2023-08-29 NOTE — Progress Notes (Signed)
 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 Swallow whole., Disp: 30 tablet, Rfl: 11   atorvastatin  (LIPITOR) 80 MG tablet, Take 1 tablet (80 mg total) by mouth daily., Disp: 30 tablet, Rfl: 5   cyclobenzaprine  (FLEXERIL ) 10 MG tablet, Take 10 mg by mouth at bedtime., Disp: , Rfl:    Erenumab-aooe (AIMOVIG) 70 MG/ML SOAJ, Inject into the skin every 30 (thirty) days., Disp: , Rfl:    ezetimibe (ZETIA) 10 MG tablet, Take 10 mg by mouth daily., Disp: , Rfl:    HYDROcodone -acetaminophen  (NORCO/VICODIN) 5-325 MG tablet, Take 1 tablet by mouth every 4 (four) hours as needed for moderate pain., Disp: 30 tablet, Rfl: 0   levETIRAcetam  (KEPPRA ) 750 MG tablet, Take 750 mg by mouth 4 (four) times daily., Disp: , Rfl:    levothyroxine  (SYNTHROID , LEVOTHROID) 50 MCG tablet, Take 50 mcg by mouth daily before breakfast., Disp: , Rfl:    loratadine  (CLARITIN ) 10 MG tablet, Take 10 mg by mouth daily at 12 noon., Disp: , Rfl:    metoprolol  tartrate (LOPRESSOR ) 25 MG tablet, Take 1 tablet (25 mg total) by mouth 2 (two) times daily., Disp: 60 tablet, Rfl: 5   Multiple Vitamin (MULITIVITAMIN WITH MINERALS) TABS, Take 1 tablet by mouth daily. , Disp: , Rfl:    nitroGLYCERIN  (NITROSTAT ) 0.4 MG SL tablet, Place 1 tablet (0.4 mg total) under the tongue every 5 (five) minutes as needed  for chest pain (up to 3 doses. If taking 3rd dose call 911)., Disp: 25 tablet, Rfl: 3   omeprazole (PRILOSEC) 20 MG capsule, Take 20 mg by mouth daily., Disp: , Rfl:    OXcarbazepine  (TRILEPTAL ) 150 MG tablet, Take 225-450 mg by mouth See admin instructions. PATIENT TAKES 225 MG by mouth  TID AND 450 MG by mouth  AT BEDTIME PER PATIENT, Disp: , Rfl:    prasugrel  (EFFIENT ) 10 MG TABS tablet, Take 1 tablet (10 mg total) by mouth daily., Disp: 30 tablet, Rfl: 11   prazosin  (MINIPRESS ) 2 MG capsule, Take 2 mg by mouth at bedtime. 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   Sleep apnea 2019   uses CPAP nightly    Tobacco Use: Social History   Tobacco Use  Smoking Status Every Day   Current packs/day: 0.50   Average packs/day: 0.5 packs/day for 40.0 years (20.0 ttl pk-yrs)   Types: Cigarettes  Smokeless Tobacco Never    Labs: Review Flowsheet       Latest Ref Rng & Units 07/02/2023  Labs for ITP Cardiac and Pulmonary Rehab  Cholestrol 0 - 200 mg/dL 161   LDL (calc) 0 - 99 mg/dL 096   HDL-C >04 mg/dL 34   Trlycerides <540 mg/dL 981   TCO2 22 - 32 mmol/L 25     Capillary Blood Glucose: Lab Results  Component Value Date   GLUCAP 104 (H) 07/02/2023     Exercise Target Goals: Exercise Program Goal: Individual exercise prescription set using results from initial 6 min  walk test and THRR while considering  patient's activity barriers and safety.   Exercise Prescription Goal: Initial exercise prescription builds to 30-45 minutes a day of aerobic activity, 2-3 days per week.  Home exercise guidelines will be given to patient during program as part of exercise prescription that the participant will acknowledge.  Activity Barriers & Risk Stratification:  Activity Barriers & Cardiac Risk Stratification - 08/29/23 1114       Activity Barriers & Cardiac Risk Stratification   Activity Barriers Arthritis;Back Problems;Joint Problems;Other (comment)    Comments Multiple back surgeries, right ankle fx and surgery x2, spinal stenosis, hx seizures, shoulder surgery.    Cardiac Risk Stratification High          6 Minute Walk:  6 Minute Walk     Row Name 08/29/23 1212         6 Minute Walk   Phase Initial     Distance 1710 feet     Walk Time 6 minutes     # of Rest Breaks 0     MPH 3.24     METS 3.73     RPE 15     Perceived Dyspnea  1     VO2 Peak 13.05     Symptoms Yes (comment)     Comments Mild shortness of breath. Bilateral calf burning.     Resting HR 70 bpm     Exercise Oxygen Saturation  during 6 min walk 96 %     Max Ex. HR 82 bpm     Max Ex. BP 166/84     2 Minute Post BP 138/72        Oxygen Initial Assessment:   Oxygen Re-Evaluation:   Oxygen Discharge (Final Oxygen Re-Evaluation):   Initial Exercise Prescription:  Initial Exercise Prescription - 08/29/23 1400       Date of Initial Exercise RX and Referring Provider   Date 08/29/23    Referring Provider Knox Perl, MD    Expected Discharge Date 11/22/23      Recumbant Bike   Level 1    RPM 50    Watts 25    Minutes 15    METs 2.3      NuStep   Level 1    SPM 85    Minutes 15    METs 2.3      Prescription Details   Frequency (times per week) 3    Duration Progress to 30 minutes of continuous aerobic without signs/symptoms of physical distress       Intensity   THRR 40-80% of Max Heartrate  45-409    Ratings of Perceived Exertion 11-13    Perceived Dyspnea 0-4      Progression   Progression Continue to progress workloads to maintain intensity without signs/symptoms of physical distress.      Resistance Training   Training Prescription Yes    Weight 3 lbs    Reps 10-15          Perform Capillary Blood Glucose checks as needed.  Exercise Prescription Changes:   Exercise Comments:   Exercise Goals and Review:   Exercise Goals     Row Name 08/29/23 1114             Exercise Goals   Increase Physical Activity Yes       Intervention Provide advice, education, support and counseling about physical activity/exercise needs.;Develop an individualized exercise prescription for aerobic and resistive training based on initial evaluation findings, risk stratification, comorbidities and participant's personal goals.       Expected Outcomes Short Term: Attend rehab on a regular basis to increase amount of physical activity.;Long Term: Exercising regularly at least 3-5 days a week.;Long Term: Add in home exercise to make exercise part of routine and to increase amount of physical activity.       Increase Strength and Stamina Yes       Intervention Provide advice, education, support and counseling about physical activity/exercise needs.;Develop an individualized exercise prescription for aerobic and resistive training based on initial evaluation findings, risk stratification, comorbidities and participant's personal goals.       Expected Outcomes Short Term: Increase workloads from initial exercise prescription for resistance, speed, and METs.;Short Term: Perform resistance training exercises routinely during rehab and add in resistance training at home;Long Term: Improve cardiorespiratory fitness, muscular endurance and strength as measured by increased METs and functional capacity ( )       Able to understand and use rate of  perceived exertion (RPE) scale Yes       Intervention Provide education and explanation on how to use RPE scale       Expected Outcomes Short Term: Able to use RPE daily in rehab to express subjective intensity level;Long Term:  Able to use RPE to guide intensity level when exercising independently       Knowledge and understanding of Target Heart Rate Range (THRR) Yes       Intervention Provide education and explanation of THRR including how the numbers were predicted and where they are located for reference       Expected Outcomes Short Term: Able to state/look up THRR;Long Term: Able to use THRR to govern intensity when exercising independently;Short Term: Able to use daily as guideline for intensity in rehab       Able to check pulse independently Yes       Intervention Provide education and demonstration on how to check pulse in carotid and radial arteries.;Review the importance of being able to check your own pulse for safety during independent exercise       Expected Outcomes Short Term: Able to explain why pulse checking is important during independent exercise;Long Term: Able to check pulse independently and accurately       Understanding of Exercise Prescription Yes       Intervention Provide education, explanation, and written materials on patient's individual exercise prescription       Expected Outcomes Short Term: Able to explain program exercise prescription;Long Term: Able to explain home exercise prescription to exercise independently  Exercise Goals Re-Evaluation :   Discharge Exercise Prescription (Final Exercise Prescription Changes):   Nutrition:  Target Goals: Understanding of nutrition guidelines, daily intake of sodium 1500mg , cholesterol 200mg , calories 30% from fat and 7% or less from saturated fats, daily to have 5 or more servings of fruits and vegetables.  Biometrics:  Pre Biometrics - 08/29/23 1030       Pre Biometrics   Waist Circumference 47  inches    Hip Circumference 45 inches    Waist to Hip Ratio 1.04 %    Triceps Skinfold 20 mm    % Body Fat 35.1 %    Grip Strength 29 kg    Flexibility --   Not performed. Mulitple back surgery, chronic pain.   Single Leg Stand 30 seconds           Nutrition Therapy Plan and Nutrition Goals:   Nutrition Assessments:  MEDIFICTS Score Key: >=70 Need to make dietary changes  40-70 Heart Healthy Diet <= 40 Therapeutic Level Cholesterol Diet    Picture Your Plate Scores: <16 Unhealthy dietary pattern with much room for improvement. 41-50 Dietary pattern unlikely to meet recommendations for good health and room for improvement. 51-60 More healthful dietary pattern, with some room for improvement.  >60 Healthy dietary pattern, although there may be some specific behaviors that could be improved.    Nutrition Goals Re-Evaluation:   Nutrition Goals Re-Evaluation:   Nutrition Goals Discharge (Final Nutrition Goals Re-Evaluation):   Psychosocial: Target Goals: Acknowledge presence or absence of significant depression and/or stress, maximize coping skills, provide positive support system. Participant is able to verbalize types and ability to use techniques and skills needed for reducing stress and depression.  Initial Review & Psychosocial Screening:  Initial Psych Review & Screening - 08/29/23 1110       Initial Review   Current issues with Current Depression;Current Anxiety/Panic;Current Stress Concerns    Source of Stress Concerns Unable to participate in former interests or hobbies;Unable to perform yard/household activities    Comments Unable to do the activities he would like to because of chronic back pain.      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 They're together everyday. Wife also has health concerns.      Barriers    Psychosocial barriers to participate in program The patient should benefit from training in stress management and relaxation.;Psychosocial barriers identified (see note)      Screening Interventions   Interventions Encouraged to exercise;To provide support and resources with identified psychosocial needs;Provide feedback about the scores to participant    Expected Outcomes Short Term goal: Utilizing psychosocial counselor, staff and physician to assist with identification of specific Stressors or current issues interfering with healing process. Setting desired goal for each stressor or current issue identified.;Long Term Goal: Stressors or current issues are controlled or eliminated.;Short Term goal: Identification and review with participant of any Quality of Life or Depression concerns found by scoring the questionnaire.;Long Term goal: The participant improves quality of Life and PHQ9 Scores as seen by post scores and/or verbalization of changes          Quality of Life Scores:  Quality of Life - 08/29/23 1420       Quality of Life   Select Quality of Life      Quality of Life Scores   Health/Function Pre 18.33 %  Socioeconomic Pre 24.83 %    Psych/Spiritual Pre 17.5 %    Family Pre 27.3 %    GLOBAL Pre 20.7 %         Scores of 19 and below usually indicate a poorer quality of life in these areas.  A difference of  2-3 points is a clinically meaningful difference.  A difference of 2-3 points in the total score of the Quality of Life Index has been associated with significant improvement in overall quality of life, self-image, physical symptoms, and general health in studies assessing change in quality of life.  PHQ-9: Review Flowsheet       08/29/2023 09/21/2014 09/07/2014  Depression screen PHQ 2/9  Decreased Interest 0 0 0  Down, Depressed, Hopeless 0 0 0  PHQ - 2 Score 0 0 0  Altered sleeping 0 - -  Tired, decreased energy 1 - -  Change in appetite 1 - -  Feeling bad  or failure about yourself  0 - -  Trouble concentrating 1 - -  Moving slowly or fidgety/restless 0 - -  Suicidal thoughts 0 - -  PHQ-9 Score 3 - -  Difficult doing work/chores Somewhat difficult - -   Interpretation of Total Score  Total Score Depression Severity:  1-4 = Minimal depression, 5-9 = Mild depression, 10-14 = Moderate depression, 15-19 = Moderately severe depression, 20-27 = Severe depression   Psychosocial Evaluation and Intervention:   Psychosocial Re-Evaluation:   Psychosocial Discharge (Final Psychosocial Re-Evaluation):   Vocational Rehabilitation: Provide vocational rehab assistance to qualifying candidates.   Vocational Rehab Evaluation & Intervention:  Vocational Rehab - 08/29/23 1114       Initial Vocational Rehab Evaluation & Intervention   Assessment shows need for Vocational Rehabilitation No      Vocational Rehab Re-Evaulation   Comments Consuelo Denmark is disabled due to chronic back pain, seizures. No VR needs at this time.          Education: Education Goals: Education classes will be provided on a weekly basis, covering required topics. Participant will state understanding/return demonstration of topics presented.     Core Videos: Exercise    Move It!  Clinical staff conducted group or individual video education with verbal and written material and guidebook.  Patient learns the recommended Pritikin exercise program. Exercise with the goal of living a long, healthy life. Some of the health benefits of exercise include controlled diabetes, healthier blood pressure levels, improved cholesterol levels, improved heart and lung capacity, improved sleep, and better body composition. Everyone should speak with their doctor before starting or changing an exercise routine.  Biomechanical Limitations Clinical staff conducted group or individual video education with verbal and written material and guidebook.  Patient learns how biomechanical limitations can  impact exercise and how we can mitigate and possibly overcome limitations to have an impactful and balanced exercise routine.  Body Composition Clinical staff conducted group or individual video education with verbal and written material and guidebook.  Patient learns that body composition (ratio of muscle mass to fat mass) is a key component to assessing overall fitness, rather than body weight alone. Increased fat mass, especially visceral belly fat, can put us  at increased risk for metabolic syndrome, type 2 diabetes, heart disease, and even death. It is recommended to combine diet and exercise (cardiovascular and resistance training) to improve your body composition. Seek guidance from your physician and exercise physiologist before implementing an exercise routine.  Exercise Action Plan Clinical staff conducted group or individual video  education with verbal and written material and guidebook.  Patient learns the recommended strategies to achieve and enjoy long-term exercise adherence, including variety, self-motivation, self-efficacy, and positive decision making. Benefits of exercise include fitness, good health, weight management, more energy, better sleep, less stress, and overall well-being.  Medical   Heart Disease Risk Reduction Clinical staff conducted group or individual video education with verbal and written material and guidebook.  Patient learns our heart is our most vital organ as it circulates oxygen, nutrients, white blood cells, and hormones throughout the entire body, and carries waste away. Data supports a plant-based eating plan like the Pritikin Program for its effectiveness in slowing progression of and reversing heart disease. The video provides a number of recommendations to address heart disease.   Metabolic Syndrome and Belly Fat  Clinical staff conducted group or individual video education with verbal and written material and guidebook.  Patient learns what metabolic  syndrome is, how it leads to heart disease, and how one can reverse it and keep it from coming back. You have metabolic syndrome if you have 3 of the following 5 criteria: abdominal obesity, high blood pressure, high triglycerides, low HDL cholesterol, and high blood sugar.  Hypertension and Heart Disease Clinical staff conducted group or individual video education with verbal and written material and guidebook.  Patient learns that high blood pressure, or hypertension, is very common in the [place] . Hypertension is largely due to excessive salt intake, but other important risk factors include being overweight, physical inactivity, drinking too much alcohol, smoking, and not eating enough potassium from fruits and vegetables. High blood pressure is a leading risk factor for heart attack, stroke, congestive heart failure, dementia, kidney failure, and premature death. Long-term effects of excessive salt intake include stiffening of the arteries and thickening of heart muscle and organ damage. Recommendations include ways to reduce hypertension and the risk of heart disease.  Diseases of Our Time - Focusing on Diabetes Clinical staff conducted group or individual video education with verbal and written material and guidebook.  Patient learns why the best way to stop diseases of our time is prevention, through food and other lifestyle changes. Medicine (such as prescription pills and surgeries) is often only a Band-Aid on the problem, not a long-term solution. Most common diseases of our time include obesity, type 2 diabetes, hypertension, heart disease, and cancer. The Pritikin Program is recommended and has been proven to help reduce, reverse, and/or prevent the damaging effects of metabolic syndrome.  Nutrition   Overview of the Pritikin Eating Plan  Clinical staff conducted group or individual video education with verbal and written material and guidebook.  Patient learns about the Pritikin  Eating Plan for disease risk reduction. The Pritikin Eating Plan emphasizes a wide variety of unrefined, minimally-processed carbohydrates, like fruits, vegetables, whole grains, and legumes. Go, Caution, and Stop food choices are explained. Plant-based and lean animal proteins are emphasized. Rationale provided for low sodium intake for blood pressure control, low added sugars for blood sugar stabilization, and low added fats and oils for coronary artery disease risk reduction and weight management.  Calorie Density  Clinical staff conducted group or individual video education with verbal and written material and guidebook.  Patient learns about calorie density and how it impacts the Pritikin Eating Plan. Knowing the characteristics of the food you choose will help you decide whether those foods will lead to weight gain or weight loss, and whether you want to consume more or less of them. Weight  loss is usually a side effect of the Pritikin Eating Plan because of its focus on low calorie-dense foods.  Label Reading  Clinical staff conducted group or individual video education with verbal and written material and guidebook.  Patient learns about the Pritikin recommended label reading guidelines and corresponding recommendations regarding calorie density, added sugars, sodium content, and whole grains.  Dining Out - Part 1  Clinical staff conducted group or individual video education with verbal and written material and guidebook.  Patient learns that restaurant meals can be sabotaging because they can be so high in calories, fat, sodium, and/or sugar. Patient learns recommended strategies on how to positively address this and avoid unhealthy pitfalls.  Facts on Fats  Clinical staff conducted group or individual video education with verbal and written material and guidebook.  Patient learns that lifestyle modifications can be just as effective, if not more so, as many medications for lowering your  risk of heart disease. A Pritikin lifestyle can help to reduce your risk of inflammation and atherosclerosis (cholesterol build-up, or plaque, in the artery walls). Lifestyle interventions such as dietary choices and physical activity address the cause of atherosclerosis. A review of the types of fats and their impact on blood cholesterol levels, along with dietary recommendations to reduce fat intake is also included.  Nutrition Action Plan  Clinical staff conducted group or individual video education with verbal and written material and guidebook.  Patient learns how to incorporate Pritikin recommendations into their lifestyle. Recommendations include planning and keeping personal health goals in mind as an important part of their success.  Healthy Mind-Set    Healthy Minds, Bodies, Hearts  Clinical staff conducted group or individual video education with verbal and written material and guidebook.  Patient learns how to identify when they are stressed. Video will discuss the impact of that stress, as well as the many benefits of stress management. Patient will also be introduced to stress management techniques. The way we think, act, and feel has an impact on our hearts.  How Our Thoughts Can Heal Our Hearts  Clinical staff conducted group or individual video education with verbal and written material and guidebook.  Patient learns that negative thoughts can cause depression and anxiety. This can result in negative lifestyle behavior and serious health problems. Cognitive behavioral therapy is an effective method to help control our thoughts in order to change and improve our emotional outlook.  Additional Videos:  Exercise    Improving Performance  Clinical staff conducted group or individual video education with verbal and written material and guidebook.  Patient learns to use a non-linear approach by alternating intensity levels and lengths of time spent exercising to help burn more calories  and lose more body fat. Cardiovascular exercise helps improve heart health, metabolism, hormonal balance, blood sugar control, and recovery from fatigue. Resistance training improves strength, endurance, balance, coordination, reaction time, metabolism, and muscle mass. Flexibility exercise improves circulation, posture, and balance. Seek guidance from your physician and exercise physiologist before implementing an exercise routine and learn your capabilities and proper form for all exercise.  Introduction to Yoga  Clinical staff conducted group or individual video education with verbal and written material and guidebook.  Patient learns about yoga, a discipline of the coming together of mind, breath, and body. The benefits of yoga include improved flexibility, improved range of motion, better posture and core strength, increased lung function, weight loss, and positive self-image. Yoga's heart health benefits include lowered blood pressure, healthier heart rate, decreased  cholesterol and triglyceride levels, improved immune function, and reduced stress. Seek guidance from your physician and exercise physiologist before implementing an exercise routine and learn your capabilities and proper form for all exercise.  Medical   Aging: Enhancing Your Quality of Life  Clinical staff conducted group or individual video education with verbal and written material and guidebook.  Patient learns key strategies and recommendations to stay in good physical health and enhance quality of life, such as prevention strategies, having an advocate, securing a Health Care Proxy and Power of Attorney, and keeping a list of medications and system for tracking them. It also discusses how to avoid risk for bone loss.  Biology of Weight Control  Clinical staff conducted group or individual video education with verbal and written material and guidebook.  Patient learns that weight gain occurs because we consume more calories than  we burn (eating more, moving less). Even if your body weight is normal, you may have higher ratios of fat compared to muscle mass. Too much body fat puts you at increased risk for cardiovascular disease, heart attack, stroke, type 2 diabetes, and obesity-related cancers. In addition to exercise, following the Pritikin Eating Plan can help reduce your risk.  Decoding Lab Results  Clinical staff conducted group or individual video education with verbal and written material and guidebook.  Patient learns that lab test reflects one measurement whose values change over time and are influenced by many factors, including medication, stress, sleep, exercise, food, hydration, pre-existing medical conditions, and more. It is recommended to use the knowledge from this video to become more involved with your lab results and evaluate your numbers to speak with your doctor.   Diseases of Our Time - Overview  Clinical staff conducted group or individual video education with verbal and written material and guidebook.  Patient learns that according to the CDC, 50% to 70% of chronic diseases (such as obesity, type 2 diabetes, elevated lipids, hypertension, and heart disease) are avoidable through lifestyle improvements including healthier food choices, listening to satiety cues, and increased physical activity.  Sleep Disorders Clinical staff conducted group or individual video education with verbal and written material and guidebook.  Patient learns how good quality and duration of sleep are important to overall health and well-being. Patient also learns about sleep disorders and how they impact health along with recommendations to address them, including discussing with a physician.  Nutrition  Dining Out - Part 2 Clinical staff conducted group or individual video education with verbal and written material and guidebook.  Patient learns how to plan ahead and communicate in order to maximize their dining experience  in a healthy and nutritious manner. Included are recommended food choices based on the type of restaurant the patient is visiting.   Fueling a Banker conducted group or individual video education with verbal and written material and guidebook.  There is a strong connection between our food choices and our health. Diseases like obesity and type 2 diabetes are very prevalent and are in large-part due to lifestyle choices. The Pritikin Eating Plan provides plenty of food and hunger-curbing satisfaction. It is easy to follow, affordable, and helps reduce health risks.  Menu Workshop  Clinical staff conducted group or individual video education with verbal and written material and guidebook.  Patient learns that restaurant meals can sabotage health goals because they are often packed with calories, fat, sodium, and sugar. Recommendations include strategies to plan ahead and to communicate with the manager, chef,  or server to help order a healthier meal.  Planning Your Eating Strategy  Clinical staff conducted group or individual video education with verbal and written material and guidebook.  Patient learns about the Pritikin Eating Plan and its benefit of reducing the risk of disease. The Pritikin Eating Plan does not focus on calories. Instead, it emphasizes high-quality, nutrient-rich foods. By knowing the characteristics of the foods, we choose, we can determine their calorie density and make informed decisions.  Targeting Your Nutrition Priorities  Clinical staff conducted group or individual video education with verbal and written material and guidebook.  Patient learns that lifestyle habits have a tremendous impact on disease risk and progression. This video provides eating and physical activity recommendations based on your personal health goals, such as reducing LDL cholesterol, losing weight, preventing or controlling type 2 diabetes, and reducing high blood  pressure.  Vitamins and Minerals  Clinical staff conducted group or individual video education with verbal and written material and guidebook.  Patient learns different ways to obtain key vitamins and minerals, including through a recommended healthy diet. It is important to discuss all supplements you take with your doctor.   Healthy Mind-Set    Smoking Cessation  Clinical staff conducted group or individual video education with verbal and written material and guidebook.  Patient learns that cigarette smoking and tobacco addiction pose a serious health risk which affects millions of people. Stopping smoking will significantly reduce the risk of heart disease, lung disease, and many forms of cancer. Recommended strategies for quitting are covered, including working with your doctor to develop a successful plan.  Culinary   Becoming a Set designer conducted group or individual video education with verbal and written material and guidebook.  Patient learns that cooking at home can be healthy, cost-effective, quick, and puts them in control. Keys to cooking healthy recipes will include looking at your recipe, assessing your equipment needs, planning ahead, making it simple, choosing cost-effective seasonal ingredients, and limiting the use of added fats, salts, and sugars.  Cooking - Breakfast and Snacks  Clinical staff conducted group or individual video education with verbal and written material and guidebook.  Patient learns how important breakfast is to satiety and nutrition through the entire day. Recommendations include key foods to eat during breakfast to help stabilize blood sugar levels and to prevent overeating at meals later in the day. Planning ahead is also a key component.  Cooking - Educational psychologist conducted group or individual video education with verbal and written material and guidebook.  Patient learns eating strategies to improve overall  health, including an approach to cook more at home. Recommendations include thinking of animal protein as a side on your plate rather than center stage and focusing instead on lower calorie dense options like vegetables, fruits, whole grains, and plant-based proteins, such as beans. Making sauces in large quantities to freeze for later and leaving the skin on your vegetables are also recommended to maximize your experience.  Cooking - Healthy Salads and Dressing Clinical staff conducted group or individual video education with verbal and written material and guidebook.  Patient learns that vegetables, fruits, whole grains, and legumes are the foundations of the Pritikin Eating Plan. Recommendations include how to incorporate each of these in flavorful and healthy salads, and how to create homemade salad dressings. Proper handling of ingredients is also covered. Cooking - Soups and Desserts  Cooking - Soups and Desserts Clinical staff conducted group or individual  video education with verbal and written material and guidebook.  Patient learns that Pritikin soups and desserts make for easy, nutritious, and delicious snacks and meal components that are low in sodium, fat, sugar, and calorie density, while high in vitamins, minerals, and filling fiber. Recommendations include simple and healthy ideas for soups and desserts.   Overview     The Pritikin Solution Program Overview Clinical staff conducted group or individual video education with verbal and written material and guidebook.  Patient learns that the results of the Pritikin Program have been documented in more than 100 articles published in peer-reviewed journals, and the benefits include reducing risk factors for (and, in some cases, even reversing) high cholesterol, high blood pressure, type 2 diabetes, obesity, and more! An overview of the three key pillars of the Pritikin Program will be covered: eating well, doing regular exercise, and having a  healthy mind-set.  WORKSHOPS  Exercise: Exercise Basics: Building Your Action Plan Clinical staff led group instruction and group discussion with PowerPoint presentation and patient guidebook. To enhance the learning environment the use of posters, models and videos may be added. At the conclusion of this workshop, patients will comprehend the difference between physical activity and exercise, as well as the benefits of incorporating both, into their routine. Patients will understand the FITT (Frequency, Intensity, Time, and Type) principle and how to use it to build an exercise action plan. In addition, safety concerns and other considerations for exercise and cardiac rehab will be addressed by the presenter. The purpose of this lesson is to promote a comprehensive and effective weekly exercise routine in order to improve patients' overall level of fitness.   Managing Heart Disease: Your Path to a Healthier Heart Clinical staff led group instruction and group discussion with PowerPoint presentation and patient guidebook. To enhance the learning environment the use of posters, models and videos may be added.At the conclusion of this workshop, patients will understand the anatomy and physiology of the heart. Additionally, they will understand how Pritikin's three pillars impact the risk factors, the progression, and the management of heart disease.  The purpose of this lesson is to provide a high-level overview of the heart, heart disease, and how the Pritikin lifestyle positively impacts risk factors.  Exercise Biomechanics Clinical staff led group instruction and group discussion with PowerPoint presentation and patient guidebook. To enhance the learning environment the use of posters, models and videos may be added. Patients will learn how the structural parts of their bodies function and how these functions impact their daily activities, movement, and exercise. Patients will learn how to  promote a neutral spine, learn how to manage pain, and identify ways to improve their physical movement in order to promote healthy living. The purpose of this lesson is to expose patients to common physical limitations that impact physical activity. Participants will learn practical ways to adapt and manage aches and pains, and to minimize their effect on regular exercise. Patients will learn how to maintain good posture while sitting, walking, and lifting.  Balance Training and Fall Prevention  Clinical staff led group instruction and group discussion with PowerPoint presentation and patient guidebook. To enhance the learning environment the use of posters, models and videos may be added. At the conclusion of this workshop, patients will understand the importance of their sensorimotor skills (vision, proprioception, and the vestibular system) in maintaining their ability to balance as they age. Patients will apply a variety of balancing exercises that are appropriate for their current level of  function. Patients will understand the common causes for poor balance, possible solutions to these problems, and ways to modify their physical environment in order to minimize their fall risk. The purpose of this lesson is to teach patients about the importance of maintaining balance as they age and ways to minimize their risk of falling.  WORKSHOPS   Nutrition:  Fueling a Ship broker led group instruction and group discussion with PowerPoint presentation and patient guidebook. To enhance the learning environment the use of posters, models and videos may be added. Patients will review the foundational principles of the Pritikin Eating Plan and understand what constitutes a serving size in each of the food groups. Patients will also learn Pritikin-friendly foods that are better choices when away from home and review make-ahead meal and snack options. Calorie density will be reviewed and  applied to three nutrition priorities: weight maintenance, weight loss, and weight gain. The purpose of this lesson is to reinforce (in a group setting) the key concepts around what patients are recommended to eat and how to apply these guidelines when away from home by planning and selecting Pritikin-friendly options. Patients will understand how calorie density may be adjusted for different weight management goals.  Mindful Eating  Clinical staff led group instruction and group discussion with PowerPoint presentation and patient guidebook. To enhance the learning environment the use of posters, models and videos may be added. Patients will briefly review the concepts of the Pritikin Eating Plan and the importance of low-calorie dense foods. The concept of mindful eating will be introduced as well as the importance of paying attention to internal hunger signals. Triggers for non-hunger eating and techniques for dealing with triggers will be explored. The purpose of this lesson is to provide patients with the opportunity to review the basic principles of the Pritikin Eating Plan, discuss the value of eating mindfully and how to measure internal cues of hunger and fullness using the Hunger Scale. Patients will also discuss reasons for non-hunger eating and learn strategies to use for controlling emotional eating.  Targeting Your Nutrition Priorities Clinical staff led group instruction and group discussion with PowerPoint presentation and patient guidebook. To enhance the learning environment the use of posters, models and videos may be added. Patients will learn how to determine their genetic susceptibility to disease by reviewing their family history. Patients will gain insight into the importance of diet as part of an overall healthy lifestyle in mitigating the impact of genetics and other environmental insults. The purpose of this lesson is to provide patients with the opportunity to assess their personal  nutrition priorities by looking at their family history, their own health history and current risk factors. Patients will also be able to discuss ways of prioritizing and modifying the Pritikin Eating Plan for their highest risk areas  Menu  Clinical staff led group instruction and group discussion with PowerPoint presentation and patient guidebook. To enhance the learning environment the use of posters, models and videos may be added. Using menus brought in from [employer], or printed from [employer], patients will apply the Pritikin dining out guidelines that were presented in the [employer] video. Patients will also be able to practice these guidelines in a variety of provided scenarios. The purpose of this lesson is to provide patients with the opportunity to practice hands-on learning of the Pritikin Dining Out guidelines with actual menus and practice scenarios.  Label Reading Clinical staff led group instruction and group discussion with  PowerPoint presentation and patient guidebook. To enhance the learning environment the use of posters, models and videos may be added. Patients will review and discuss the Pritikin label reading guidelines presented in Pritikin's Label Reading Educational series video. Using fool labels brought in from local grocery stores and markets, patients will apply the label reading guidelines and determine if the packaged food meet the Pritikin guidelines. The purpose of this lesson is to provide patients with the opportunity to review, discuss, and practice hands-on learning of the Pritikin Label Reading guidelines with actual packaged food labels. Cooking School  Pritikin's [employer] are designed to teach patients ways to prepare quick, simple, and affordable recipes at home. The importance of nutrition's role in chronic disease risk reduction is reflected in its emphasis in the overall Pritikin program. By learning how to prepare  essential core Pritikin Eating Plan recipes, patients will increase control over what they eat; be able to customize the flavor of foods without the use of added salt, sugar, or fat; and improve the quality of the food they consume. By learning a set of core recipes which are easily assembled, quickly prepared, and affordable, patients are more likely to prepare more healthy foods at home. These workshops focus on convenient breakfasts, simple entres, side dishes, and desserts which can be prepared with minimal effort and are consistent with nutrition recommendations for cardiovascular risk reduction. Cooking [employer] are taught by a Armed forces logistics/support/administrative officer (RD) who has been trained by the [employer]. The chef or RD has a clear understanding of the importance of minimizing - if not completely eliminating - added fat, sugar, and sodium in recipes. Throughout the series of Cooking School Workshop sessions, patients will learn about healthy ingredients and efficient methods of cooking to build confidence in their capability to prepare    Cooking School weekly topics:  Adding Flavor- Sodium-Free  Fast and Healthy Breakfasts  Powerhouse Plant-Based Proteins  Satisfying Salads and Dressings  Simple Sides and Sauces  International Cuisine-Spotlight on the [employer] Zones  Delicious Desserts  Savory Soups  [employer] - Meals in a Astronomer Appetizers and Snacks  Comforting Weekend Breakfasts  One-Pot Wonders   Fast Evening Meals  Landscape architect Your Pritikin Plate  WORKSHOPS   Healthy Mindset (Psychosocial):  Focused Goals, Sustainable Changes Clinical staff led group instruction and group discussion with PowerPoint presentation and patient guidebook. To enhance the learning environment the use of posters, models and videos may be added. Patients will be able to apply effective goal setting strategies to establish at least one personal goal, and  then take consistent, meaningful action toward that goal. They will learn to identify common barriers to achieving personal goals and develop strategies to overcome them. Patients will also gain an understanding of how our mind-set can impact our ability to achieve goals and the importance of cultivating a positive and growth-oriented mind-set. The purpose of this lesson is to provide patients with a deeper understanding of how to set and achieve personal goals, as well as the tools and strategies needed to overcome common obstacles which may arise along the way.  From Head to Heart: The Power of a Healthy Outlook  Clinical staff led group instruction and group discussion with PowerPoint presentation and patient guidebook. To enhance the learning environment the use of posters, models and videos may be added. Patients will be able to recognize and describe the impact of emotions and mood on physical health. They  will discover the importance of self-care and explore self-care practices which may work for them. Patients will also learn how to utilize the 4 C's to cultivate a healthier outlook and better manage stress and challenges. The purpose of this lesson is to demonstrate to patients how a healthy outlook is an essential part of maintaining good health, especially as they continue their cardiac rehab journey.  Healthy Sleep for a Healthy Heart Clinical staff led group instruction and group discussion with PowerPoint presentation and patient guidebook. To enhance the learning environment the use of posters, models and videos may be added. At the conclusion of this workshop, patients will be able to demonstrate knowledge of the importance of sleep to overall health, well-being, and quality of life. They will understand the symptoms of, and treatments for, common sleep disorders. Patients will also be able to identify daytime and nighttime behaviors which impact sleep, and they will be able to apply these  tools to help manage sleep-related challenges. The purpose of this lesson is to provide patients with a general overview of sleep and outline the importance of quality sleep. Patients will learn about a few of the most common sleep disorders. Patients will also be introduced to the concept of "sleep hygiene," and discover ways to self-manage certain sleeping problems through simple daily behavior changes. Finally, the workshop will motivate patients by clarifying the links between quality sleep and their goals of heart-healthy living.   Recognizing and Reducing Stress Clinical staff led group instruction and group discussion with PowerPoint presentation and patient guidebook. To enhance the learning environment the use of posters, models and videos may be added. At the conclusion of this workshop, patients will be able to understand the types of stress reactions, differentiate between acute and chronic stress, and recognize the impact that chronic stress has on their health. They will also be able to apply different coping mechanisms, such as reframing negative self-talk. Patients will have the opportunity to practice a variety of stress management techniques, such as deep abdominal breathing, progressive muscle relaxation, and/or guided imagery.  The purpose of this lesson is to educate patients on the role of stress in their lives and to provide healthy techniques for coping with it.  Learning Barriers/Preferences:  Learning Barriers/Preferences - 08/29/23 1114       Learning Barriers/Preferences   Learning Barriers Hearing   Some hearing loss. Wears glasses.   Learning Preferences Skilled Demonstration          Education Topics:  Knowledge Questionnaire Score:  Knowledge Questionnaire Score - 08/29/23 1421       Knowledge Questionnaire Score   Pre Score 27/28          Core Components/Risk Factors/Patient Goals at Admission:  Personal Goals and Risk Factors at Admission - 08/29/23  1144       Core Components/Risk Factors/Patient Goals on Admission    Weight Management Yes;Obesity;Weight Loss    Intervention Weight Management/Obesity: Establish reasonable short term and long term weight goals.;Obesity: Provide education and appropriate resources to help participant work on and attain dietary goals.    Admit Weight 238 lb 12.1 oz (108.3 kg)    Expected Outcomes Short Term: Continue to assess and modify interventions until short term weight is achieved;Long Term: Adherence to nutrition and physical activity/exercise program aimed toward attainment of established weight goal;Weight Loss: Understanding of general recommendations for a balanced deficit meal plan, which promotes 1-2 lb weight loss per week and includes a negative energy balance of [phone] kcal/d  Tobacco Cessation Yes    Number of packs per day 0.3    Intervention Assist the participant in steps to quit. Provide individualized education and counseling about committing to Tobacco Cessation, relapse prevention, and pharmacological support that can be provided by physician.;Education officer, environmental, assist with locating and accessing local/national Quit Smoking programs, and support quit date choice.    Expected Outcomes Short Term: Will demonstrate readiness to quit, by selecting a quit date.;Long Term: Complete abstinence from all tobacco products for at least 12 months from quit date.;Short Term: Will quit all tobacco product use, adhering to prevention of relapse plan.    Hypertension Yes    Intervention Provide education on lifestyle modifcations including regular physical activity/exercise, weight management, moderate sodium restriction and increased consumption of fresh fruit, vegetables, and low fat dairy, alcohol moderation, and smoking cessation.;Monitor prescription use compliance.    Expected Outcomes Short Term: Continued assessment and intervention until BP is < 140/59mm HG in hypertensive  participants. < 130/59mm HG in hypertensive participants with diabetes, heart failure or chronic kidney disease.;Long Term: Maintenance of blood pressure at goal levels.    Lipids Yes    Intervention Provide education and support for participant on nutrition & aerobic/resistive exercise along with prescribed medications to achieve LDL 70mg , HDL >40mg .    Expected Outcomes Short Term: Participant states understanding of desired cholesterol values and is compliant with medications prescribed. Participant is following exercise prescription and nutrition guidelines.;Long Term: Cholesterol controlled with medications as prescribed, with individualized exercise RX and with personalized nutrition plan. Value goals: LDL < 70mg , HDL > 40 mg.    Stress Yes    Intervention Offer individual and/or small group education and counseling on adjustment to heart disease, stress management and health-related lifestyle change. Teach and support self-help strategies.;Refer participants experiencing significant psychosocial distress to appropriate mental health specialists for further evaluation and treatment. When possible, include family members and significant others in education/counseling sessions.    Expected Outcomes Short Term: Participant demonstrates changes in health-related behavior, relaxation and other stress management skills, ability to obtain effective social support, and compliance with psychotropic medications if prescribed.;Long Term: Emotional wellbeing is indicated by absence of clinically significant psychosocial distress or social isolation.          Core Components/Risk Factors/Patient Goals Review:    Core Components/Risk Factors/Patient Goals at Discharge (Final Review):    ITP Comments:  ITP Comments     Row Name 08/29/23 1030           ITP Comments Medical Director- Dr. Gaylyn Keas, MD. Introduction to the [facility] / Intensive Cardiac Rehab Program.          Comments:  Consuelo Denmark attended orientation for the cardiac rehabilitation program on  08/29/2023  to perform initial intake and exercise walk test. He was introduced to the [employer] education and orientation packet was reviewed. Completed 6-minute walk test, measurements, initial ITP, and exercise prescription. Vital signs stable. Telemetry-normal sinus rhythm, asymptomatic.   Service time was from 1030 to 1231. Doree Games, MS, ACSM CEP 08/29/2023 1423

## 2023-08-29 NOTE — Progress Notes (Signed)
 Cardiac Rehab Medication Review   Does the patient  feel that his/her medications are working for him/her?  yes  Has the patient been experiencing any side effects to the medications prescribed?  Yes, bruising  Does the patient measure his/her own blood pressure or blood glucose at home?  No, has BP cuff. Checks once in a while.  Does the patient have any problems obtaining medications due to transportation or finances?   no  Understanding of regimen: excellent Understanding of indications: excellent Potential of compliance: excellent    Comments: Patient is taking medications as directed. No concerns at this time.    Manuel Pearson 08/29/2023 11:24 AM

## 2023-09-04 ENCOUNTER — Encounter (HOSPITAL_COMMUNITY)
Admission: RE | Admit: 2023-09-04 | Discharge: 2023-09-04 | Disposition: A | Discharge status: Home / Self Care | Source: Ambulatory Visit | Attending: Cardiology | Admitting: Cardiology

## 2023-09-04 DIAGNOSIS — Z48812 Encounter for surgical aftercare following surgery on the circulatory system: Secondary | ICD-10-CM | POA: Insufficient documentation

## 2023-09-04 DIAGNOSIS — I252 Old myocardial infarction: Secondary | ICD-10-CM | POA: Diagnosis not present

## 2023-09-04 DIAGNOSIS — Z955 Presence of coronary angioplasty implant and graft: Secondary | ICD-10-CM | POA: Insufficient documentation

## 2023-09-04 DIAGNOSIS — I2111 ST elevation (STEMI) myocardial infarction involving right coronary artery: Secondary | ICD-10-CM

## 2023-09-04 NOTE — Progress Notes (Signed)
 Daily Session Note  Patient Details  Name: Manuel Pearson MRN: [id number] Date of Birth: [birth year] Referring Provider:   Flowsheet Row INTENSIVE CARDIAC REHAB ORIENT from 08/29/2023 in [facility]  Referring Provider Ladona Heinz, MD    Encounter Date: 09/04/2023  Check In:  Session Check In - 09/04/23 1048       Check-In   Supervising physician immediately available to respond to emergencies [facility] MD immediately available    Physician(s) Josefa Beauvais NP    Location [facility]    Staff Present Arnoldo Gal, MS, ACSM-CEP, Exercise Physiologist;David Makemson, MS, ACSM-CEP, CCRP, Exercise Physiologist;Johnny Fayette, MS, Exercise Physiologist;Jetta Vannie BS, ACSM-CEP, Exercise Physiologist;Maria Whitaker, RN, BSN    Virtual Visit No    Medication changes reported     No    Fall or balance concerns reported    No    Tobacco Cessation No Change    Current number of cigarettes/nicotine  per day     6    Warm-up and Cool-down Performed as group-led instruction   Cardiac Rehab Orientation   Resistance Training Performed Yes    VAD Patient? No    PAD/SET Patient? No      Pain Assessment   Currently in Pain? No/denies    Pain Score 0-No pain    Multiple Pain Sites No          Capillary Blood Glucose: No results found for this or any previous visit (from the past 24 hours).   Exercise Prescription Changes - 09/04/23 1028       Response to Exercise   Blood Pressure (Admit) 112/62    Blood Pressure (Exercise) 140/80    Blood Pressure (Exit) 98/60    Heart Rate (Admit) 63 bpm    Heart Rate (Exercise) 96 bpm    Heart Rate (Exit) 73 bpm    Rating of Perceived Exertion (Exercise) 11    Symptoms None    Comments Off to a great start with exercise.    Duration Continue with 30 min of aerobic exercise without signs/symptoms of physical distress.    Intensity THRR unchanged      Progression   Progression  Continue to progress workloads to maintain intensity without signs/symptoms of physical distress.    Average METs 4.4      Resistance Training   Training Prescription Yes    Weight 5 lbs    Reps 10-15    Time 10 Minutes      Interval Training   Interval Training No      Bike   Level 4.5    Watts 72    Minutes 15    METs 6.5      Recumbant Bike   Level 1    RPM 83    Watts 18    Minutes 15    METs 2.4          Social History   Tobacco Use  Smoking Status Every Day   Current packs/day: 0.50   Average packs/day: 0.5 packs/day for 40.0 years (20.0 ttl pk-yrs)   Types: Cigarettes  Smokeless Tobacco Never    Goals Met:  Exercise tolerated well No report of concerns or symptoms today Strength training completed today  Goals Unmet:  Not Applicable  Comments: [name] started cardiac rehab today and tolerated exercise on the equipment well. Telemetry was normal sinus rhythm, no symptoms with exercise.   Dr. Wilbert Bihari is Medical Director for  Cardiac Rehab at [facility].

## 2023-09-04 NOTE — Progress Notes (Signed)
 Daily Session Note  Patient Details  Name: Manuel Pearson MRN: [id number] Date of Birth: 05-Aug-1965 Referring Provider:   Flowsheet Row INTENSIVE CARDIAC REHAB ORIENT from 08/29/2023 in [facility]  Referring Provider Ladona Heinz, MD    Encounter Date: 09/04/2023  Check In:  Session Check In - 09/04/23 1048       Check-In   Supervising physician immediately available to respond to emergencies [facility] MD immediately available    Physician(s) Josefa Beauvais NP    Location [facility]    Staff Present Arnoldo Gal, MS, ACSM-CEP, Exercise Physiologist;David Makemson, MS, ACSM-CEP, CCRP, Exercise Physiologist;Johnny Fayette, MS, Exercise Physiologist;Jetta Vannie BS, ACSM-CEP, Exercise Physiologist;Sariah Henkin, RN, BSN    Virtual Visit No    Medication changes reported     No    Fall or balance concerns reported    No    Tobacco Cessation No Change    Current number of cigarettes/nicotine  per day     6    Warm-up and Cool-down Performed as group-led instruction   Cardiac Rehab Orientation   Resistance Training Performed Yes    VAD Patient? No    PAD/SET Patient? No      Pain Assessment   Currently in Pain? No/denies    Pain Score 0-No pain    Multiple Pain Sites No          Capillary Blood Glucose: No results found for this or any previous visit (from the past 24 hours).   Exercise Prescription Changes - 09/04/23 1028       Response to Exercise   Blood Pressure (Admit) 112/62    Blood Pressure (Exercise) 140/80    Blood Pressure (Exit) 98/60    Heart Rate (Admit) 63 bpm    Heart Rate (Exercise) 96 bpm    Heart Rate (Exit) 73 bpm    Rating of Perceived Exertion (Exercise) 11    Symptoms None    Comments Off to a great start with exercise.    Duration Continue with 30 min of aerobic exercise without signs/symptoms of physical distress.    Intensity THRR unchanged      Progression   Progression  Continue to progress workloads to maintain intensity without signs/symptoms of physical distress.    Average METs 4.4      Resistance Training   Training Prescription Yes    Weight 5 lbs    Reps 10-15    Time 10 Minutes      Interval Training   Interval Training No      Bike   Level 4.5    Watts 72    Minutes 15    METs 6.5      Recumbant Bike   Level 1    RPM 83    Watts 18    Minutes 15    METs 2.4          Social History   Tobacco Use  Smoking Status Every Day   Current packs/day: 0.50   Average packs/day: 0.5 packs/day for 40.0 years (20.0 ttl pk-yrs)   Types: Cigarettes  Smokeless Tobacco Never    Goals Met:  Exercise tolerated well No report of concerns or symptoms today Strength training completed today  Goals Unmet:  Not Applicable  Comments: Pt started cardiac rehab today.  Pt tolerated light exercise without difficulty. VSS, telemetry-Sinus Rhythm, asymptomatic.  Medication list reconciled. Pt denies barriers to medicaiton compliance.  PSYCHOSOCIAL ASSESSMENT:  PHQ-3. Pt exhibits positive coping skills, hopeful outlook with supportive family. No psychosocial needs identified at this time, no psychosocial interventions necessary.   Pt oriented to exercise equipment and routine.    Understanding verbalized.[name] RN BSN     Dr. Wilbert Bihari is Medical Director for Cardiac Rehab at [facility].

## 2023-09-06 ENCOUNTER — Encounter (HOSPITAL_COMMUNITY)
Admission: RE | Admit: 2023-09-06 | Discharge: 2023-09-06 | Disposition: A | Discharge status: Home / Self Care | Source: Ambulatory Visit | Attending: Cardiology | Admitting: Cardiology

## 2023-09-06 DIAGNOSIS — I2111 ST elevation (STEMI) myocardial infarction involving right coronary artery: Secondary | ICD-10-CM

## 2023-09-06 DIAGNOSIS — Z48812 Encounter for surgical aftercare following surgery on the circulatory system: Secondary | ICD-10-CM | POA: Diagnosis not present

## 2023-09-06 DIAGNOSIS — Z955 Presence of coronary angioplasty implant and graft: Secondary | ICD-10-CM

## 2023-09-08 ENCOUNTER — Encounter (HOSPITAL_COMMUNITY)
Admission: RE | Admit: 2023-09-08 | Discharge: 2023-09-08 | Disposition: A | Discharge status: Home / Self Care | Source: Ambulatory Visit | Attending: Cardiology | Admitting: Cardiology

## 2023-09-08 DIAGNOSIS — I2111 ST elevation (STEMI) myocardial infarction involving right coronary artery: Secondary | ICD-10-CM

## 2023-09-08 DIAGNOSIS — Z955 Presence of coronary angioplasty implant and graft: Secondary | ICD-10-CM

## 2023-09-08 DIAGNOSIS — Z48812 Encounter for surgical aftercare following surgery on the circulatory system: Secondary | ICD-10-CM | POA: Diagnosis not present

## 2023-09-08 NOTE — Progress Notes (Signed)
 QUALITY OF LIFE SCORE REVIEW  Pt completed Quality of Life survey as a participant in Cardiac Rehab.  Scores 21.0 or below are considered low.  Overall 20.7, Health and Function 18.33, socioeconomic 24.83, physiological and spiritual 17.5, family 27.3. Patient quality of life slightly altered by physical constraints which limits ability to perform as prior to recent cardiac illness. Reviewed quality of life. Manuel Pearson denies being depressed. Manuel Pearson says he has been through a lot just having recent back surgery and now cardiac stenting in April. Manuel Pearson says his energy level has improved some since he has started cardiac rehab this week. Offered emotional support and reassurance.  Will continue to monitor and intervene as necessary. Manuel Elpidio Quan RN BSN

## 2023-09-11 ENCOUNTER — Encounter (HOSPITAL_COMMUNITY)
Admission: RE | Admit: 2023-09-11 | Discharge: 2023-09-11 | Disposition: A | Discharge status: Home / Self Care | Source: Ambulatory Visit | Attending: Cardiology

## 2023-09-11 DIAGNOSIS — Z48812 Encounter for surgical aftercare following surgery on the circulatory system: Secondary | ICD-10-CM | POA: Diagnosis not present

## 2023-09-11 DIAGNOSIS — Z955 Presence of coronary angioplasty implant and graft: Secondary | ICD-10-CM

## 2023-09-11 DIAGNOSIS — I2111 ST elevation (STEMI) myocardial infarction involving right coronary artery: Secondary | ICD-10-CM

## 2023-09-13 ENCOUNTER — Encounter (HOSPITAL_COMMUNITY)
Admission: RE | Admit: 2023-09-13 | Discharge: 2023-09-13 | Disposition: A | Discharge status: Home / Self Care | Source: Ambulatory Visit | Attending: Cardiology | Admitting: Cardiology

## 2023-09-13 DIAGNOSIS — Z955 Presence of coronary angioplasty implant and graft: Secondary | ICD-10-CM | POA: Insufficient documentation

## 2023-09-13 DIAGNOSIS — I2111 ST elevation (STEMI) myocardial infarction involving right coronary artery: Secondary | ICD-10-CM | POA: Diagnosis present

## 2023-09-18 ENCOUNTER — Encounter (HOSPITAL_COMMUNITY)
Admission: RE | Admit: 2023-09-18 | Discharge: 2023-09-18 | Disposition: A | Discharge status: Home / Self Care | Source: Ambulatory Visit | Attending: Cardiology | Admitting: Cardiology

## 2023-09-18 DIAGNOSIS — I2111 ST elevation (STEMI) myocardial infarction involving right coronary artery: Secondary | ICD-10-CM

## 2023-09-18 DIAGNOSIS — Z955 Presence of coronary angioplasty implant and graft: Secondary | ICD-10-CM

## 2023-09-20 ENCOUNTER — Encounter (HOSPITAL_COMMUNITY)
Admission: RE | Admit: 2023-09-20 | Discharge: 2023-09-20 | Disposition: A | Discharge status: Home / Self Care | Source: Ambulatory Visit | Attending: Cardiology | Admitting: Cardiology

## 2023-09-20 DIAGNOSIS — I2111 ST elevation (STEMI) myocardial infarction involving right coronary artery: Secondary | ICD-10-CM

## 2023-09-20 DIAGNOSIS — Z955 Presence of coronary angioplasty implant and graft: Secondary | ICD-10-CM

## 2023-09-22 ENCOUNTER — Encounter (HOSPITAL_COMMUNITY)
Admission: RE | Admit: 2023-09-22 | Discharge: 2023-09-22 | Disposition: A | Discharge status: Home / Self Care | Source: Ambulatory Visit | Attending: Cardiology | Admitting: Cardiology

## 2023-09-22 DIAGNOSIS — I2111 ST elevation (STEMI) myocardial infarction involving right coronary artery: Secondary | ICD-10-CM | POA: Diagnosis not present

## 2023-09-22 DIAGNOSIS — Z955 Presence of coronary angioplasty implant and graft: Secondary | ICD-10-CM

## 2023-09-22 LAB — HEPATIC FUNCTION PANEL
ALT: 29 IU/L (ref 0–44)
AST: 22 IU/L (ref 0–40)
Albumin: 4.4 g/dL (ref 3.8–4.9)
Alkaline Phosphatase: 124 IU/L — ABNORMAL HIGH (ref 44–121)
Bilirubin Total: 0.4 mg/dL (ref 0.0–1.2)
Bilirubin, Direct: 0.16 mg/dL (ref 0.00–0.40)
Total Protein: 6.7 g/dL (ref 6.0–8.5)

## 2023-09-22 LAB — LIPID PANEL
Chol/HDL Ratio: 1.2 ratio (ref 0.0–5.0)
Cholesterol, Total: 65 mg/dL — ABNORMAL LOW (ref 100–199)
HDL: 54 mg/dL (ref >39)
Triglycerides: 62 mg/dL (ref 0–149)

## 2023-09-22 NOTE — Progress Notes (Signed)
 Reviewed home exercise guidelines with Manuel Pearson including endpoints, temperature precautions, target heart rate and rate of perceived exertion. [name] is not currently walking for exercise because he has plates in his lower leg/ankle which cause soreness/ pain/ numbness. Discussed seated stretches as an alternative to weight bearing exercise that bothers his leg. Handout given on seated aerobic exercise that can be done at home. Manuel Pearson voices understanding of instructions given.  Arnoldo CHRISTELLA Gal, MS, ACSM CEP

## 2023-09-25 ENCOUNTER — Encounter (HOSPITAL_COMMUNITY)
Admission: RE | Admit: 2023-09-25 | Discharge: 2023-09-25 | Disposition: A | Discharge status: Home / Self Care | Source: Ambulatory Visit | Attending: Cardiology | Admitting: Cardiology

## 2023-09-25 DIAGNOSIS — I2111 ST elevation (STEMI) myocardial infarction involving right coronary artery: Secondary | ICD-10-CM | POA: Diagnosis not present

## 2023-09-25 DIAGNOSIS — Z955 Presence of coronary angioplasty implant and graft: Secondary | ICD-10-CM

## 2023-09-25 NOTE — Progress Notes (Signed)
 Cardiac Individual Treatment Plan  Patient Details  Name: Manuel Pearson MRN: [id number] Date of Birth: [birth year] Referring Provider:   Flowsheet Row INTENSIVE CARDIAC REHAB ORIENT from 08/29/2023 in [facility] for [facility], V[facility], & [facility]  Referring Provider Ladona Heinz, MD    Initial Encounter Date:  Flowsheet Row INTENSIVE CARDIAC REHAB ORIENT from 08/29/2023 in [facility] for Heart, Vascular, & Lung Health  Date 08/29/23    Visit Diagnosis: 07/03/23 STEMI  07/03/23 DES pRCA  Patient's Home Medications on Admission:  Current Outpatient Medications:    aspirin  EC 81 MG tablet, Take 1 tablet (81 mg total) by mouth daily. Swallow whole., Disp: 30 tablet, Rfl: 11   atorvastatin  (LIPITOR) 80 MG tablet, Take 1 tablet (80 mg total) by mouth daily., Disp: 30 tablet, Rfl: 5   cyclobenzaprine  (FLEXERIL ) 10 MG tablet, Take 10 mg by mouth at bedtime., Disp: , Rfl:    Erenumab-aooe (AIMOVIG) 70 MG/ML SOAJ, Inject into the skin every 30 (thirty) days., Disp: , Rfl:    ezetimibe (ZETIA) 10 MG tablet, Take 10 mg by mouth daily., Disp: , Rfl:    HYDROcodone -acetaminophen  (NORCO/VICODIN) 5-325 MG tablet, Take 1 tablet by mouth every 4 (four) hours as needed for moderate pain., Disp: 30 tablet, Rfl: 0   levETIRAcetam  (KEPPRA ) 750 MG tablet, Take 750 mg by mouth 4 (four) times daily., Disp: , Rfl:    levothyroxine  (SYNTHROID , LEVOTHROID) 50 MCG tablet, Take 50 mcg by mouth daily before breakfast., Disp: , Rfl:    loratadine  (CLARITIN ) 10 MG tablet, Take 10 mg by mouth daily at 12 noon., Disp: , Rfl:    metoprolol  tartrate (LOPRESSOR ) 25 MG tablet, Take 1 tablet (25 mg total) by mouth 2 (two) times daily., Disp: 60 tablet, Rfl: 5   Multiple Vitamin (MULITIVITAMIN WITH MINERALS) TABS, Take 1 tablet by mouth daily. , Disp: , Rfl:    nitroGLYCERIN  (NITROSTAT ) 0.4 MG SL tablet, Place 1 tablet (0.4 mg total) under the tongue every 5 (five) minutes as needed  for chest pain (up to 3 doses. If taking 3rd dose call 911)., Disp: 25 tablet, Rfl: 3   omeprazole (PRILOSEC) 20 MG capsule, Take 20 mg by mouth daily., Disp: , Rfl:    OXcarbazepine  (TRILEPTAL ) 150 MG tablet, Take 225-450 mg by mouth See admin instructions. PATIENT TAKES 225 MG by mouth  TID AND 450 MG by mouth  AT BEDTIME PER PATIENT, Disp: , Rfl:    prasugrel  (EFFIENT ) 10 MG TABS tablet, Take 1 tablet (10 mg total) by mouth daily., Disp: 30 tablet, Rfl: 11   prazosin  (MINIPRESS ) 2 MG capsule, Take 2 mg by mouth at bedtime. 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   Sleep apnea 2019   uses CPAP nightly    Tobacco Use: Social History   Tobacco Use  Smoking Status Every Day   Current packs/day: 0.50   Average packs/day: 0.5 packs/day for 40.0 years (20.0 ttl pk-yrs)   Types: Cigarettes  Smokeless Tobacco Never    Labs: Review Flowsheet       Latest Ref Rng & Units 07/02/2023 09/21/2023  Labs for ITP Cardiac and Pulmonary Rehab  Cholestrol 100 - 199 mg/dL 843  65   LDL (calc) 0 - 99 mg/dL 898  Comment   HDL-C >60 mg/dL 34  54   Trlycerides 0 - 149 mg/dL 896  62   TCO2 22 - 32 mmol/L 25  -    Capillary Blood Glucose: Lab Results  Component Value Date   GLUCAP 104 (H) 07/02/2023     Exercise Target Goals: Exercise Program Goal: Individual exercise prescription  set using results from initial 6 min walk test and THRR while considering  patient's activity barriers and safety.   Exercise Prescription Goal: Initial exercise prescription builds to 30-45 minutes a day of aerobic activity, 2-3 days per week.  Home exercise guidelines will be given to patient during program as part of exercise prescription that the participant will acknowledge.  Activity Barriers & Risk Stratification:  Activity Barriers & Cardiac Risk Stratification - 08/29/23 1114       Activity Barriers & Cardiac Risk Stratification   Activity Barriers Arthritis;Back Problems;Joint Problems;Other (comment)    Comments Multiple back surgeries, right ankle fx and surgery x2, spinal stenosis, hx seizures, shoulder surgery.    Cardiac Risk Stratification High          6 Minute Walk:  6 Minute Walk     Row Name 08/29/23 1212         6 Minute Walk   Phase Initial     Distance 1710 feet     Walk Time 6 minutes     # of Rest Breaks 0     MPH 3.24     METS 3.73     RPE 15     Perceived Dyspnea  1     VO2 Peak 13.05     Symptoms Yes (comment)     Comments Mild shortness of breath. Bilateral calf burning.     Resting HR 70 bpm     Exercise Oxygen Saturation  during 6 min walk 96 %     Max Ex. HR 82 bpm     Max Ex. BP 166/84     2 Minute Post BP 138/72        Oxygen Initial Assessment:   Oxygen Re-Evaluation:   Oxygen Discharge (Final Oxygen Re-Evaluation):   Initial Exercise Prescription:  Initial Exercise Prescription - 08/29/23 1400       Date of Initial Exercise RX and Referring Provider   Date 08/29/23    Referring Provider Ladona Heinz, MD    Expected Discharge Date 11/22/23      Recumbant Bike   Level 1    RPM 50    Watts 25    Minutes 15    METs 2.3      NuStep   Level 1    SPM 85    Minutes 15    METs 2.3      Prescription Details   Frequency (times per week) 3    Duration Progress to 30 minutes of continuous aerobic without signs/symptoms  of physical distress  Intensity   THRR 40-80% of Max Heartrate 64-129    Ratings of Perceived Exertion 11-13    Perceived Dyspnea 0-4      Progression   Progression Continue to progress workloads to maintain intensity without signs/symptoms of physical distress.      Resistance Training   Training Prescription Yes    Weight 3 lbs    Reps 10-15          Perform Capillary Blood Glucose checks as needed.  Exercise Prescription Changes:   Exercise Prescription Changes     Row Name 09/04/23 1028 09/13/23 1033 09/25/23 1034         Response to Exercise   Blood Pressure (Admit) 112/62 106/68 116/66     Blood Pressure (Exercise) 140/80 140/80 160/80     Blood Pressure (Exit) 98/60 108/70 102/66     Heart Rate (Admit) 63 bpm 67 bpm 55 bpm     Heart Rate (Exercise) 96 bpm 104 bpm 102 bpm     Heart Rate (Exit) 73 bpm 76 bpm 68 bpm     Rating of Perceived Exertion (Exercise) 11 12 11      Symptoms None None None     Comments Off to a great start with exercise. -- --     Duration Continue with 30 min of aerobic exercise without signs/symptoms of physical distress. Continue with 30 min of aerobic exercise without signs/symptoms of physical distress. Continue with 30 min of aerobic exercise without signs/symptoms of physical distress.     Intensity THRR unchanged THRR unchanged THRR unchanged       Progression   Progression Continue to progress workloads to maintain intensity without signs/symptoms of physical distress. Continue to progress workloads to maintain intensity without signs/symptoms of physical distress. Continue to progress workloads to maintain intensity without signs/symptoms of physical distress.     Average METs 4.4 3.7 4       Resistance Training   Training Prescription Yes No Yes     Weight 5 lbs Relaxation day, no weights. 5 lbs     Reps 10-15 -- 10-15     Time 10 Minutes -- 5 Minutes       Interval Training   Interval Training No No No       Bike    Level 4.5 4.5 4.5     Watts 72 72 91     Minutes 15 15 15      METs 6.5 4.1 4.5       Recumbant Bike   Level 1 -- --     RPM 83 -- --     Watts 18 -- --     Minutes 15 -- --     METs 2.4 -- --       NuStep   Level -- 3 4     SPM -- 80 135     Minutes -- 15 15     METs -- 3.4 3.6       Home Exercise Plan   Plans to continue exercise at -- -- Home (comment)     Frequency -- -- Add 2 additional days to program exercise sessions.     Initial Home Exercises Provided -- -- 09/22/23        Exercise Comments:   Exercise Comments     Row Name 09/04/23 1136 09/18/23 1102 09/22/23 1103       Exercise Comments [name] tolerated mild to moderate intensity well without symptoms. Oriented to the exercise equipment and stretching  routine. Increased workload on bike and was able to use the 5 lb hand weights without issue. Reviewed METs with [name]. Reviewed home exercise guidelines, METs, and goals with [name].        Exercise Goals and Review:   Exercise Goals     Row Name 08/29/23 1114             Exercise Goals   Increase Physical Activity Yes       Intervention Provide advice, education, support and counseling about physical activity/exercise needs.;Develop an individualized exercise prescription for aerobic and resistive training based on initial evaluation findings, risk stratification, comorbidities and participant's personal goals.       Expected Outcomes Short Term: Attend rehab on a regular basis to increase amount of physical activity.;Long Term: Exercising regularly at least 3-5 days a week.;Long Term: Add in home exercise to make exercise part of routine and to increase amount of physical activity.       Increase Strength and Stamina Yes       Intervention Provide advice, education, support and counseling about physical activity/exercise needs.;Develop an individualized exercise prescription for aerobic and resistive training based on initial evaluation findings, risk  stratification, comorbidities and participant's personal goals.       Expected Outcomes Short Term: Increase workloads from initial exercise prescription for resistance, speed, and METs.;Short Term: Perform resistance training exercises routinely during rehab and add in resistance training at home;Long Term: Improve cardiorespiratory fitness, muscular endurance and strength as measured by increased METs and functional capacity ( )       Able to understand and use rate of perceived exertion (RPE) scale Yes       Intervention Provide education and explanation on how to use RPE scale       Expected Outcomes Short Term: Able to use RPE daily in rehab to express subjective intensity level;Long Term:  Able to use RPE to guide intensity level when exercising independently       Knowledge and understanding of Target Heart Rate Range (THRR) Yes       Intervention Provide education and explanation of THRR including how the numbers were predicted and where they are located for reference       Expected Outcomes Short Term: Able to state/look up THRR;Long Term: Able to use THRR to govern intensity when exercising independently;Short Term: Able to use daily as guideline for intensity in rehab       Able to check pulse independently Yes       Intervention Provide education and demonstration on how to check pulse in carotid and radial arteries.;Review the importance of being able to check your own pulse for safety during independent exercise       Expected Outcomes Short Term: Able to explain why pulse checking is important during independent exercise;Long Term: Able to check pulse independently and accurately       Understanding of Exercise Prescription Yes       Intervention Provide education, explanation, and written materials on patient's individual exercise prescription       Expected Outcomes Short Term: Able to explain program exercise prescription;Long Term: Able to explain home exercise prescription to  exercise independently          Exercise Goals Re-Evaluation :  Exercise Goals Re-Evaluation     Row Name 09/04/23 1136 09/22/23 1103           Exercise Goal Re-Evaluation   Exercise Goals Review Increase Physical Activity;Increase Strength and Stamina;Able to understand and use  rate of perceived exertion (RPE) scale Increase Physical Activity;Increase Strength and Stamina;Able to understand and use rate of perceived exertion (RPE) scale;Knowledge and understanding of Target Heart Rate Range (THRR);Able to check pulse independently;Understanding of Exercise Prescription      Comments [name] was able to understand and use RPE scale appropriately. Reviewed exercise prescription with [name]. He is not currently walking for exercise because he has plates in his lower leg/ankle which cause soreness/ pain/ numbness. Discussed seated stretches as an alternative to weight bearing exercise that bothers his leg. Handout given on seated aerobic exercise that can be done at home.      Expected Outcomes Progress workloads as tolerated to help increase strength and endurance. Continue to progress workloads. Introduce seated aerobic exercise at home 1-2 days/week at home.         Discharge Exercise Prescription (Final Exercise Prescription Changes):  Exercise Prescription Changes - 09/25/23 1034       Response to Exercise   Blood Pressure (Admit) 116/66    Blood Pressure (Exercise) 160/80    Blood Pressure (Exit) 102/66    Heart Rate (Admit) 55 bpm    Heart Rate (Exercise) 102 bpm    Heart Rate (Exit) 68 bpm    Rating of Perceived Exertion (Exercise) 11    Symptoms None    Duration Continue with 30 min of aerobic exercise without signs/symptoms of physical distress.    Intensity THRR unchanged      Progression   Progression Continue to progress workloads to maintain intensity without signs/symptoms of physical distress.    Average METs 4      Resistance Training   Training Prescription Yes     Weight 5 lbs    Reps 10-15    Time 5 Minutes      Interval Training   Interval Training No      Bike   Level 4.5    Watts 91    Minutes 15    METs 4.5      NuStep   Level 4    SPM 135    Minutes 15    METs 3.6      Home Exercise Plan   Plans to continue exercise at Home (comment)    Frequency Add 2 additional days to program exercise sessions.    Initial Home Exercises Provided 09/22/23          Nutrition:  Target Goals: Understanding of nutrition guidelines, daily intake of sodium 1500mg , cholesterol 200mg , calories 30% from fat and 7% or less from saturated fats, daily to have 5 or more servings of fruits and vegetables.  Biometrics:  Pre Biometrics - 08/29/23 1030       Pre Biometrics   Waist Circumference 47 inches    Hip Circumference 45 inches    Waist to Hip Ratio 1.04 %    Triceps Skinfold 20 mm    % Body Fat 35.1 %    Grip Strength 29 kg    Flexibility --   Not performed. Mulitple back surgery, chronic pain.   Single Leg Stand 30 seconds           Nutrition Therapy Plan and Nutrition Goals:  Nutrition Therapy & Goals - 09/04/23 1052       Nutrition Therapy   Diet Heart Healthy Diet    Drug/Food Interactions Statins/Certain Fruits      Personal Nutrition Goals   Nutrition Goal Patient to identify strategies for reducing cardiovascular risk by attending the [employer] education and  nutrition series weekly    Personal Goal #2 Patient to improve diet quality by using the plate method as a guide for meal planning to include lean protein/plant protein, fruits, vegetables, whole grains, nonfat dairy as part of a well-balanced diet.    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 He does take salt tablets due to chronic hyponatremia; patient reports this is caused by his seizure medications. He reports making  some dietary changes including reduced processed foods and increased lean proteins. A1c is in a prediabetic range. LDL is at goal. Patient will benefit from participation in intensive cardiac rehab for nutrition, exercise, and lifestyle modification.      Intervention Plan   Intervention Prescribe, educate and counsel regarding individualized specific dietary modifications aiming towards targeted core components such as weight, hypertension, lipid management, diabetes, heart failure and other comorbidities.;Nutrition handout(s) given to patient.    Expected Outcomes Short Term Goal: Understand basic principles of dietary content, such as calories, fat, sodium, cholesterol and nutrients.;Long Term Goal: Adherence to prescribed nutrition plan.          Nutrition Assessments:  Nutrition Assessments - 09/04/23 1059       Rate Your Plate Scores   Pre Score 50         MEDIFICTS Score Key: >=70 Need to make dietary changes  40-70 Heart Healthy Diet <= 40 Therapeutic Level Cholesterol Diet   Flowsheet Row INTENSIVE CARDIAC REHAB from 09/04/2023 in [facility] for Heart, Vascular, & Lung Health  Picture Your Plate Total Score on Admission 50   Picture Your Plate Scores: <59 Unhealthy dietary pattern with much room for improvement. 41-50 Dietary pattern unlikely to meet recommendations for good health and room for improvement. 51-60 More healthful dietary pattern, with some room for improvement.  >60 Healthy dietary pattern, although there may be some specific behaviors that could be improved.    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 He does take salt tablets due to chronic hyponatremia; patient reports this is caused by his seizure medications. He reports making some dietary changes including reduced processed foods and increased lean proteins. A1c is in a prediabetic range. LDL is at goal. Patient will benefit from participation in intensive cardiac rehab for nutrition, exercise, and lifestyle modification.          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 He does take salt tablets due to chronic hyponatremia; patient reports this is caused by his seizure medications.  He reports making some dietary changes including reduced processed foods and increased lean proteins. A1c is in a prediabetic range. LDL is at goal. Patient will benefit from participation in intensive cardiac rehab for nutrition, exercise, and lifestyle modification.          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 He does take salt tablets due to chronic hyponatremia; patient reports this is caused by his seizure medications. He reports making some dietary changes including reduced processed foods and increased lean proteins. A1c is in a prediabetic range. LDL is at goal. Patient will benefit from  participation in intensive cardiac rehab for nutrition, exercise, and lifestyle modification.          Psychosocial: Target Goals: Acknowledge presence or absence of significant depression and/or stress, maximize coping skills, provide positive support system. Participant is able to verbalize types and ability to use techniques and skills needed for reducing stress and depression.  Initial Review & Psychosocial Screening:  Initial Psych Review & Screening - 08/29/23 1110       Initial Review   Current issues with Current Depression;Current Anxiety/Panic;Current Stress Concerns    Source of Stress Concerns Unable to participate in former interests or hobbies;Unable to perform yard/household activities    Comments Unable to do the activities he would like to because of chronic back pain.      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 They're together everyday. Wife also has health concerns.      Barriers   Psychosocial barriers to participate in program The patient should benefit from training in stress management and relaxation.;Psychosocial barriers identified (see note)      Screening Interventions   Interventions Encouraged to exercise;To provide support and resources with identified psychosocial needs;Provide feedback about the scores to participant    Expected Outcomes Short Term goal: Utilizing psychosocial counselor, staff and physician to assist with identification of specific Stressors or current issues interfering with healing process. Setting desired goal for each stressor or current issue identified.;Long Term Goal: Stressors or current issues are controlled or eliminated.;Short Term goal: Identification and review with participant of any Quality of Life or Depression concerns found by scoring the questionnaire.;Long Term goal: The  participant improves quality of Life and PHQ9 Scores as seen by post scores and/or verbalization of changes          Quality of Life Scores:  Quality of Life - 08/29/23 1420       Quality of Life   Select Quality of Life      Quality of Life Scores   Health/Function Pre 18.33 %    Socioeconomic Pre 24.83 %    Psych/Spiritual Pre 17.5 %    Family Pre 27.3 %    GLOBAL Pre 20.7 %         Scores of 19 and below usually indicate a poorer quality of life in these areas.  A difference of  2-3 points is a clinically meaningful difference.  A difference of 2-3 points  in the total score of the Quality of Life Index has been associated with significant improvement in overall quality of life, self-image, physical symptoms, and general health in studies assessing change in quality of life.  PHQ-9: Review Flowsheet       08/29/2023 09/21/2014 09/07/2014  Depression screen PHQ 2/9  Decreased Interest 0 0 0  Down, Depressed, Hopeless 0 0 0  PHQ - 2 Score 0 0 0  Altered sleeping 0 - -  Tired, decreased energy 1 - -  Change in appetite 1 - -  Feeling bad or failure about yourself  0 - -  Trouble concentrating 1 - -  Moving slowly or fidgety/restless 0 - -  Suicidal thoughts 0 - -  PHQ-9 Score 3 - -  Difficult doing work/chores Somewhat difficult - -   Interpretation of Total Score  Total Score Depression Severity:  1-4 = Minimal depression, 5-9 = Mild depression, 10-14 = Moderate depression, 15-19 = Moderately severe depression, 20-27 = Severe depression   Psychosocial Evaluation and Intervention:   Psychosocial Re-Evaluation:  Psychosocial Re-Evaluation     Row Name 09/13/23 1707             Psychosocial Re-Evaluation   Current issues with Current Depression;Current Anxiety/Panic;Current Stress Concerns       Comments Reviewed quality of life. [name] denies being depressed. [name] says he has been through a lot just having recent back surgery and now cardiac stenting in April.  [name] says his energy level has improved some since he has started cardiac rehab       Expected Outcomes [name] says he is not currently exeperiencing any depression       Interventions Stress management education;Relaxation education;Encouraged to attend Cardiac Rehabilitation for the exercise       Continue Psychosocial Services  No Follow up required         Initial Review   Source of Stress Concerns Chronic Illness       Comments Will continue to montior and offer support as needed.          Psychosocial Discharge (Final Psychosocial Re-Evaluation):  Psychosocial Re-Evaluation - 09/13/23 1707       Psychosocial Re-Evaluation   Current issues with Current Depression;Current Anxiety/Panic;Current Stress Concerns    Comments Reviewed quality of life. [name] denies being depressed. [name] says he has been through a lot just having recent back surgery and now cardiac stenting in April. [name] says his energy level has improved some since he has started cardiac rehab    Expected Outcomes [name] says he is not currently exeperiencing any depression    Interventions Stress management education;Relaxation education;Encouraged to attend Cardiac Rehabilitation for the exercise    Continue Psychosocial Services  No Follow up required      Initial Review   Source of Stress Concerns Chronic Illness    Comments Will continue to montior and offer support as needed.          Vocational Rehabilitation: Provide vocational rehab assistance to qualifying candidates.   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 No VR needs at this time.          Education: Education Goals: Education classes will be provided on a weekly basis, covering  required  topics. Participant will state understanding/return demonstration of topics presented.    Education     Row Name 09/04/23 1100     Education   Cardiac Education Topics Pritikin   Psychologist, forensic Exercise Education   Exercise Education Biomechanial Limitations   Instruction Review Code 1- Verbalizes Understanding   Class Start Time 1155   Class Stop Time 1235   Class Time Calculation (min) 40 min    Row Name 09/08/23 1100     Education   Cardiac Education Topics Pritikin   Licensed conveyancer Nutrition   Nutrition Vitamins and Minerals   Instruction Review Code 1- Verbalizes Understanding   Class Start Time 1145   Class Stop Time 1225   Class Time Calculation (min) 40 min    Row Name 09/11/23 1600     Education   Cardiac Education Topics Pritikin   Research officer, political party Dietitian   Nutrition Fueling a Forensic psychologist   Instruction Review Code 1- Verbalizes Understanding   Class Start Time 1145   Class Stop Time 1230   Class Time Calculation (min) 45 min    Row Name 09/13/23 1500     Education   Cardiac Education Topics Pritikin   Customer service manager   Weekly Topic International Cuisine- Spotlight on the [employer] Zones   Instruction Review Code 1- Verbalizes Understanding   Class Start Time 1145   Class Stop Time 1225   Class Time Calculation (min) 40 min    Row Name 09/18/23 1200     Education   Cardiac Education Topics Pritikin   Geographical information systems officer Psychosocial   Psychosocial Workshop Healthy Sleep for a Healthy Heart   Instruction Review Code 1- Verbalizes Understanding   Class Start Time 1150   Class Stop Time 1230   Class Time Calculation (min) 40 min    Row Name 09/20/23 1100     Education   Cardiac Education Topics  Pritikin   Customer service manager   Weekly Topic Simple Sides and Sauces   Instruction Review Code 1- Verbalizes Understanding   Class Start Time 1145   Class Stop Time 1225   Class Time Calculation (min) 40 min    Row Name 09/22/23 1200     Education   Cardiac Education Topics Pritikin   Nurse, children's Exercise Physiologist   [facility] Psychosocial   Psychosocial How Our Thoughts Can Heal Our Hearts   Instruction Review Code 1- Verbalizes Understanding   Class Start Time 1153   Class Stop Time 1241   Class Time Calculation (min) 48 min    Row Name 09/25/23 1200     Education   Cardiac Education Topics Pritikin   Hospital doctor Education   General Education Hypertension and Heart Disease   Instruction Review Code 1- Verbalizes Understanding   Class Start Time 1150   Class Stop Time 1225   Class Time Calculation (min) 35 min      Core  Videos: Exercise    Move It!  Clinical staff conducted group or individual video education with verbal and written material and guidebook.  Patient learns the recommended Pritikin exercise program. Exercise with the goal of living a long, healthy life. Some of the health benefits of exercise include controlled diabetes, healthier blood pressure levels, improved cholesterol levels, improved heart and lung capacity, improved sleep, and better body composition. Everyone should speak with their doctor before starting or changing an exercise routine.  Biomechanical Limitations Clinical staff conducted group or individual video education with verbal and written material and guidebook.  Patient learns how biomechanical limitations can impact exercise and how we can mitigate and possibly overcome limitations to have an impactful and balanced exercise routine.  Body Composition Clinical staff conducted group  or individual video education with verbal and written material and guidebook.  Patient learns that body composition (ratio of muscle mass to fat mass) is a key component to assessing overall fitness, rather than body weight alone. Increased fat mass, especially visceral belly fat, can put us  at increased risk for metabolic syndrome, type 2 diabetes, heart disease, and even death. It is recommended to combine diet and exercise (cardiovascular and resistance training) to improve your body composition. Seek guidance from your physician and exercise physiologist before implementing an exercise routine.  Exercise Action Plan Clinical staff conducted group or individual video education with verbal and written material and guidebook.  Patient learns the recommended strategies to achieve and enjoy long-term exercise adherence, including variety, self-motivation, self-efficacy, and positive decision making. Benefits of exercise include fitness, good health, weight management, more energy, better sleep, less stress, and overall well-being.  Medical   Heart Disease Risk Reduction Clinical staff conducted group or individual video education with verbal and written material and guidebook.  Patient learns our heart is our most vital organ as it circulates oxygen, nutrients, white blood cells, and hormones throughout the entire body, and carries waste away. Data supports a plant-based eating plan like the Pritikin Program for its effectiveness in slowing progression of and reversing heart disease. The video provides a number of recommendations to address heart disease.   Metabolic Syndrome and Belly Fat  Clinical staff conducted group or individual video education with verbal and written material and guidebook.  Patient learns what metabolic syndrome is, how it leads to heart disease, and how one can reverse it and keep it from coming back. You have metabolic syndrome if you have 3 of the following 5 criteria:  abdominal obesity, high blood pressure, high triglycerides, low HDL cholesterol, and high blood sugar.  Hypertension and Heart Disease Clinical staff conducted group or individual video education with verbal and written material and guidebook.  Patient learns that high blood pressure, or hypertension, is very common in the [place] . Hypertension is largely due to excessive salt intake, but other important risk factors include being overweight, physical inactivity, drinking too much alcohol, smoking, and not eating enough potassium from fruits and vegetables. High blood pressure is a leading risk factor for heart attack, stroke, congestive heart failure, dementia, kidney failure, and premature death. Long-term effects of excessive salt intake include stiffening of the arteries and thickening of heart muscle and organ damage. Recommendations include ways to reduce hypertension and the risk of heart disease.  Diseases of Our Time - Focusing on Diabetes Clinical staff conducted group or individual video education with verbal and written material and guidebook.  Patient learns why the best way to stop diseases of our time is  prevention, through food and other lifestyle changes. Medicine (such as prescription pills and surgeries) is often only a Band-Aid on the problem, not a long-term solution. Most common diseases of our time include obesity, type 2 diabetes, hypertension, heart disease, and cancer. The Pritikin Program is recommended and has been proven to help reduce, reverse, and/or prevent the damaging effects of metabolic syndrome.  Nutrition   Overview of the Pritikin Eating Plan  Clinical staff conducted group or individual video education with verbal and written material and guidebook.  Patient learns about the Pritikin Eating Plan for disease risk reduction. The Pritikin Eating Plan emphasizes a wide variety of unrefined, minimally-processed carbohydrates, like fruits, vegetables, whole  grains, and legumes. Go, Caution, and Stop food choices are explained. Plant-based and lean animal proteins are emphasized. Rationale provided for low sodium intake for blood pressure control, low added sugars for blood sugar stabilization, and low added fats and oils for coronary artery disease risk reduction and weight management.  Calorie Density  Clinical staff conducted group or individual video education with verbal and written material and guidebook.  Patient learns about calorie density and how it impacts the Pritikin Eating Plan. Knowing the characteristics of the food you choose will help you decide whether those foods will lead to weight gain or weight loss, and whether you want to consume more or less of them. Weight loss is usually a side effect of the Pritikin Eating Plan because of its focus on low calorie-dense foods.  Label Reading  Clinical staff conducted group or individual video education with verbal and written material and guidebook.  Patient learns about the Pritikin recommended label reading guidelines and corresponding recommendations regarding calorie density, added sugars, sodium content, and whole grains.  Dining Out - Part 1  Clinical staff conducted group or individual video education with verbal and written material and guidebook.  Patient learns that restaurant meals can be sabotaging because they can be so high in calories, fat, sodium, and/or sugar. Patient learns recommended strategies on how to positively address this and avoid unhealthy pitfalls.  Facts on Fats  Clinical staff conducted group or individual video education with verbal and written material and guidebook.  Patient learns that lifestyle modifications can be just as effective, if not more so, as many medications for lowering your risk of heart disease. A Pritikin lifestyle can help to reduce your risk of inflammation and atherosclerosis (cholesterol build-up, or plaque, in the artery walls). Lifestyle  interventions such as dietary choices and physical activity address the cause of atherosclerosis. A review of the types of fats and their impact on blood cholesterol levels, along with dietary recommendations to reduce fat intake is also included.  Nutrition Action Plan  Clinical staff conducted group or individual video education with verbal and written material and guidebook.  Patient learns how to incorporate Pritikin recommendations into their lifestyle. Recommendations include planning and keeping personal health goals in mind as an important part of their success.  Healthy Mind-Set    Healthy Minds, Bodies, Hearts  Clinical staff conducted group or individual video education with verbal and written material and guidebook.  Patient learns how to identify when they are stressed. Video will discuss the impact of that stress, as well as the many benefits of stress management. Patient will also be introduced to stress management techniques. The way we think, act, and feel has an impact on our hearts.  How Our Thoughts Can Heal Our Hearts  Clinical staff conducted group or individual video education  with verbal and written material and guidebook.  Patient learns that negative thoughts can cause depression and anxiety. This can result in negative lifestyle behavior and serious health problems. Cognitive behavioral therapy is an effective method to help control our thoughts in order to change and improve our emotional outlook.  Additional Videos:  Exercise    Improving Performance  Clinical staff conducted group or individual video education with verbal and written material and guidebook.  Patient learns to use a non-linear approach by alternating intensity levels and lengths of time spent exercising to help burn more calories and lose more body fat. Cardiovascular exercise helps improve heart health, metabolism, hormonal balance, blood sugar control, and recovery from fatigue. Resistance training  improves strength, endurance, balance, coordination, reaction time, metabolism, and muscle mass. Flexibility exercise improves circulation, posture, and balance. Seek guidance from your physician and exercise physiologist before implementing an exercise routine and learn your capabilities and proper form for all exercise.  Introduction to Yoga  Clinical staff conducted group or individual video education with verbal and written material and guidebook.  Patient learns about yoga, a discipline of the coming together of mind, breath, and body. The benefits of yoga include improved flexibility, improved range of motion, better posture and core strength, increased lung function, weight loss, and positive self-image. Yoga's heart health benefits include lowered blood pressure, healthier heart rate, decreased cholesterol and triglyceride levels, improved immune function, and reduced stress. Seek guidance from your physician and exercise physiologist before implementing an exercise routine and learn your capabilities and proper form for all exercise.  Medical   Aging: Enhancing Your Quality of Life  Clinical staff conducted group or individual video education with verbal and written material and guidebook.  Patient learns key strategies and recommendations to stay in good physical health and enhance quality of life, such as prevention strategies, having an advocate, securing a Health Care Proxy and Power of Attorney, and keeping a list of medications and system for tracking them. It also discusses how to avoid risk for bone loss.  Biology of Weight Control  Clinical staff conducted group or individual video education with verbal and written material and guidebook.  Patient learns that weight gain occurs because we consume more calories than we burn (eating more, moving less). Even if your body weight is normal, you may have higher ratios of fat compared to muscle mass. Too much body fat puts you at increased  risk for cardiovascular disease, heart attack, stroke, type 2 diabetes, and obesity-related cancers. In addition to exercise, following the Pritikin Eating Plan can help reduce your risk.  Decoding Lab Results  Clinical staff conducted group or individual video education with verbal and written material and guidebook.  Patient learns that lab test reflects one measurement whose values change over time and are influenced by many factors, including medication, stress, sleep, exercise, food, hydration, pre-existing medical conditions, and more. It is recommended to use the knowledge from this video to become more involved with your lab results and evaluate your numbers to speak with your doctor.   Diseases of Our Time - Overview  Clinical staff conducted group or individual video education with verbal and written material and guidebook.  Patient learns that according to the CDC, 50% to 70% of chronic diseases (such as obesity, type 2 diabetes, elevated lipids, hypertension, and heart disease) are avoidable through lifestyle improvements including healthier food choices, listening to satiety cues, and increased physical activity.  Sleep Disorders Clinical staff conducted group or individual video  education with verbal and written material and guidebook.  Patient learns how good quality and duration of sleep are important to overall health and well-being. Patient also learns about sleep disorders and how they impact health along with recommendations to address them, including discussing with a physician.  Nutrition  Dining Out - Part 2 Clinical staff conducted group or individual video education with verbal and written material and guidebook.  Patient learns how to plan ahead and communicate in order to maximize their dining experience in a healthy and nutritious manner. Included are recommended food choices based on the type of restaurant the patient is visiting.   Fueling a Tax inspector conducted group or individual video education with verbal and written material and guidebook.  There is a strong connection between our food choices and our health. Diseases like obesity and type 2 diabetes are very prevalent and are in large-part due to lifestyle choices. The Pritikin Eating Plan provides plenty of food and hunger-curbing satisfaction. It is easy to follow, affordable, and helps reduce health risks.  Menu Workshop  Clinical staff conducted group or individual video education with verbal and written material and guidebook.  Patient learns that restaurant meals can sabotage health goals because they are often packed with calories, fat, sodium, and sugar. Recommendations include strategies to plan ahead and to communicate with the manager, chef, or server to help order a healthier meal.  Planning Your Eating Strategy  Clinical staff conducted group or individual video education with verbal and written material and guidebook.  Patient learns about the Pritikin Eating Plan and its benefit of reducing the risk of disease. The Pritikin Eating Plan does not focus on calories. Instead, it emphasizes high-quality, nutrient-rich foods. By knowing the characteristics of the foods, we choose, we can determine their calorie density and make informed decisions.  Targeting Your Nutrition Priorities  Clinical staff conducted group or individual video education with verbal and written material and guidebook.  Patient learns that lifestyle habits have a tremendous impact on disease risk and progression. This video provides eating and physical activity recommendations based on your personal health goals, such as reducing LDL cholesterol, losing weight, preventing or controlling type 2 diabetes, and reducing high blood pressure.  Vitamins and Minerals  Clinical staff conducted group or individual video education with verbal and written material and guidebook.  Patient learns different ways to  obtain key vitamins and minerals, including through a recommended healthy diet. It is important to discuss all supplements you take with your doctor.   Healthy Mind-Set    Smoking Cessation  Clinical staff conducted group or individual video education with verbal and written material and guidebook.  Patient learns that cigarette smoking and tobacco addiction pose a serious health risk which affects millions of people. Stopping smoking will significantly reduce the risk of heart disease, lung disease, and many forms of cancer. Recommended strategies for quitting are covered, including working with your doctor to develop a successful plan.  Culinary   Becoming a Set designer conducted group or individual video education with verbal and written material and guidebook.  Patient learns that cooking at home can be healthy, cost-effective, quick, and puts them in control. Keys to cooking healthy recipes will include looking at your recipe, assessing your equipment needs, planning ahead, making it simple, choosing cost-effective seasonal ingredients, and limiting the use of added fats, salts, and sugars.  Cooking - Breakfast and Snacks  Clinical staff conducted group or individual video  education with verbal and written material and guidebook.  Patient learns how important breakfast is to satiety and nutrition through the entire day. Recommendations include key foods to eat during breakfast to help stabilize blood sugar levels and to prevent overeating at meals later in the day. Planning ahead is also a key component.  Cooking - Educational psychologist conducted group or individual video education with verbal and written material and guidebook.  Patient learns eating strategies to improve overall health, including an approach to cook more at home. Recommendations include thinking of animal protein as a side on your plate rather than center stage and focusing instead on lower  calorie dense options like vegetables, fruits, whole grains, and plant-based proteins, such as beans. Making sauces in large quantities to freeze for later and leaving the skin on your vegetables are also recommended to maximize your experience.  Cooking - Healthy Salads and Dressing Clinical staff conducted group or individual video education with verbal and written material and guidebook.  Patient learns that vegetables, fruits, whole grains, and legumes are the foundations of the Pritikin Eating Plan. Recommendations include how to incorporate each of these in flavorful and healthy salads, and how to create homemade salad dressings. Proper handling of ingredients is also covered. Cooking - Soups and [employer] - Soups and Desserts Clinical staff conducted group or individual video education with verbal and written material and guidebook.  Patient learns that Pritikin soups and desserts make for easy, nutritious, and delicious snacks and meal components that are low in sodium, fat, sugar, and calorie density, while high in vitamins, minerals, and filling fiber. Recommendations include simple and healthy ideas for soups and desserts.   Overview     The Pritikin Solution Program Overview Clinical staff conducted group or individual video education with verbal and written material and guidebook.  Patient learns that the results of the Pritikin Program have been documented in more than 100 articles published in peer-reviewed journals, and the benefits include reducing risk factors for (and, in some cases, even reversing) high cholesterol, high blood pressure, type 2 diabetes, obesity, and more! An overview of the three key pillars of the Pritikin Program will be covered: eating well, doing regular exercise, and having a healthy mind-set.  WORKSHOPS  Exercise: Exercise Basics: Building Your Action Plan Clinical staff led group instruction and group discussion with PowerPoint presentation and  patient guidebook. To enhance the learning environment the use of posters, models and videos may be added. At the conclusion of this workshop, patients will comprehend the difference between physical activity and exercise, as well as the benefits of incorporating both, into their routine. Patients will understand the FITT (Frequency, Intensity, Time, and Type) principle and how to use it to build an exercise action plan. In addition, safety concerns and other considerations for exercise and cardiac rehab will be addressed by the presenter. The purpose of this lesson is to promote a comprehensive and effective weekly exercise routine in order to improve patients' overall level of fitness.   Managing Heart Disease: Your Path to a Healthier Heart Clinical staff led group instruction and group discussion with PowerPoint presentation and patient guidebook. To enhance the learning environment the use of posters, models and videos may be added.At the conclusion of this workshop, patients will understand the anatomy and physiology of the heart. Additionally, they will understand how Pritikin's three pillars impact the risk factors, the progression, and the management of heart disease.  The purpose of this  lesson is to provide a high-level overview of the heart, heart disease, and how the Pritikin lifestyle positively impacts risk factors.  Exercise Biomechanics Clinical staff led group instruction and group discussion with PowerPoint presentation and patient guidebook. To enhance the learning environment the use of posters, models and videos may be added. Patients will learn how the structural parts of their bodies function and how these functions impact their daily activities, movement, and exercise. Patients will learn how to promote a neutral spine, learn how to manage pain, and identify ways to improve their physical movement in order to promote healthy living. The purpose of this lesson is to expose  patients to common physical limitations that impact physical activity. Participants will learn practical ways to adapt and manage aches and pains, and to minimize their effect on regular exercise. Patients will learn how to maintain good posture while sitting, walking, and lifting.  Balance Training and Fall Prevention  Clinical staff led group instruction and group discussion with PowerPoint presentation and patient guidebook. To enhance the learning environment the use of posters, models and videos may be added. At the conclusion of this workshop, patients will understand the importance of their sensorimotor skills (vision, proprioception, and the vestibular system) in maintaining their ability to balance as they age. Patients will apply a variety of balancing exercises that are appropriate for their current level of function. Patients will understand the common causes for poor balance, possible solutions to these problems, and ways to modify their physical environment in order to minimize their fall risk. The purpose of this lesson is to teach patients about the importance of maintaining balance as they age and ways to minimize their risk of falling.  WORKSHOPS   Nutrition:  Fuel[employer] a Ship broker led group instruction and group discussion with PowerPoint presentation and patient guidebook. To enhance the learning environment the use of posters, models and videos may be added. Patients will review the foundational principles of the [facility] Eating Plan and understand what constitutes a serving size in each of the food groups. Patients will also learn [facility]in-friendly foods that are better choices when away from home and review make-ahead meal and snack options. Calorie density will be reviewed and applied to three nutrition priorities: weight maintenance, weight loss, and weight gain. The purpose of this lesson is to reinforce (in a group setting) the key concepts around what  patients are recommended to eat and how to apply these guidelines when away from home by planning and selecting Pritikin-friendly options. Patients will understand how calorie density may be adjusted for different weight management goals.  Mindful Eating  Clinical staff led group instruction and group discussion with PowerPoint presentation and patient guidebook. To enhance the learning environment the use of posters, models and videos may be added. Patients will briefly review the concepts of the [facility] Eating Plan and the importance of low-calorie dense foods. The concept of mindful eating will be introduced as well as the importance of paying attention to internal hunger signals. Triggers for non-hunger eating and techniques for dealing with triggers will be explored. The purpose of this lesson is to provide patients with the opportunity to review the basic principles of the [facility] Eating Plan, discuss the value of eating mindfully and how to measure internal cues of hunger and fullness using the Hunger Scale. Patients will also discuss reasons for non-hunger eating and learn strategies to use for controlling emotional eating.  Targeting Your Nutrition Priorities Clinical staff led group instruction and  group discussion with PowerPoint presentation and patient guidebook. To enhance the learning environment the use of posters, models and videos may be added. Patients will learn how to determine their genetic susceptibility to disease by reviewing their family history. Patients will gain insight into the importance of diet as part of an overall healthy lifestyle in mitigating the impact of genetics and other environmental insults. The purpose of this lesson is to provide patients with the opportunity to assess their personal nutrition priorities by looking at their family history, their own health history and current risk factors. Patients will also be able to discuss ways of prioritizing and modifying  the [facility] Eating Plan for their highest risk areas  Menu  Clinical staff led group instruction and group discussion with PowerPoint presentation and patient guidebook. To enhance the learning environment the use of posters, models and videos may be added. Using menus brought in from [employer], or printed from [employer], patients will apply the [facility]rit[facility]in dining out guidelines that were presented in the [employer] video. Patients will also be able to practice these guidelines in a variety of provided scenarios. The purpose of this lesson is to provide patients with the opportunity to practice hands-on learning of the [facility] Dining Out guidelines with actual menus and practice scenarios.  Label Reading Clinical staff led group instruction and group discussion with PowerPoint presentation and patient guidebook. To enhance the learning environment the use of posters, models and videos may be added. Patients will review and discuss the [facility] label reading guidelines presented in [employer]'s Label Reading Educational series video. Using fool labels brought in from local grocery stores and markets, patients will apply the label reading guidelines and determine if the packaged food meet the [facility]in guidelines. The purpose of this lesson is to provide patients with the opportunity to review, discuss, and practice hands-on learning of the [facility] Label Reading guidelines with actual packaged food labels. Cooking School  [employer]'s [employer] are designed to teach patients ways to prepare quick, simple, and affordable recipes at home. The importance of nutrition's role in chronic disease risk reduction is reflected in its emphasis in the overall [facility] program. By learning how to prepare essential core [facility] Eating Plan recipes, patients will increase control over what they eat; be able to customize the flavor of foods without the use of added salt, sugar, or  fat; and improve the quality of the food they consume. By learning a set of core recipes which are easily assembled, quickly prepared, and affordable, patients are more likely to prepare more healthy foods at home. These workshops focus on convenient breakfasts, simple entres, side dishes, and desserts which can be prepared with minimal effort and are consistent with nutrition recommendations for cardiovascular risk reduction. Cooking [employer] are taught by a Armed forces logistics/support/administrative officer (RD) who has been trained by the [employer]. The chef or RD has a clear understanding of the importance of minimizing - if not completely eliminating - added fat, sugar, and sodium in recipes. Throughout the series of Cooking School Workshop sessions, patients will learn about healthy ingredients and efficient methods of cooking to build confidence in their capability to prepare    Cooking School weekly topics:  Adding Flavor- Sodium-Free  Fast and Healthy Breakfasts  Powerhouse Plant-Based Proteins  Satisfying Salads and Dressings  Simple Sides and Sauces  International Cuisine-Spotlight on the [employer] Zones  Delicious Desserts  Savory Soups  [employer] - Meals in a  Snap  Tasty Appetizers and Snacks  Comforting Weekend Breakfasts  One-Pot Wonders   Fast [employer] Your Pritikin Plate  WORKSHOPS   Healthy Mindset (Psychosocial):  Focused Goals, Sustainable Changes Clinical staff led group instruction and group discussion with PowerPoint presentation and patient guidebook. To enhance the learning environment the use of posters, models and videos may be added. Patients will be able to apply effective goal setting strategies to establish at least one personal goal, and then take consistent, meaningful action toward that goal. They will learn to identify common barriers to achieving personal goals and develop strategies to overcome them. Patients  will also gain an understanding of how our mind-set can impact our ability to achieve goals and the importance of cultivating a positive and growth-oriented mind-set. The purpose of this lesson is to provide patients with a deeper understanding of how to set and achieve personal goals, as well as the tools and strategies needed to overcome common obstacles which may arise along the way.  From Head to Heart: The Power of a Healthy Outlook  Clinical staff led group instruction and group discussion with PowerPoint presentation and patient guidebook. To enhance the learning environment the use of posters, models and videos may be added. Patients will be able to recognize and describe the impact of emotions and mood on physical health. They will discover the importance of self-care and explore self-care practices which may work for them. Patients will also learn how to utilize the 4 C's to cultivate a healthier outlook and better manage stress and challenges. The purpose of this lesson is to demonstrate to patients how a healthy outlook is an essential part of maintaining good health, especially as they continue their cardiac rehab journey.  Healthy Sleep for a Healthy Heart Clinical staff led group instruction and group discussion with PowerPoint presentation and patient guidebook. To enhance the learning environment the use of posters, models and videos may be added. At the conclusion of this workshop, patients will be able to demonstrate knowledge of the importance of sleep to overall health, well-being, and quality of life. They will understand the symptoms of, and treatments for, common sleep disorders. Patients will also be able to identify daytime and nighttime behaviors which impact sleep, and they will be able to apply these tools to help manage sleep-related challenges. The purpose of this lesson is to provide patients with a general overview of sleep and outline the importance of quality sleep. Patients  will learn about a few of the most common sleep disorders. Patients will also be introduced to the concept of "sleep hygiene," and discover ways to self-manage certain sleeping problems through simple daily behavior changes. Finally, the workshop will motivate patients by clarifying the links between quality sleep and their goals of heart-healthy living.   Recognizing and Reducing Stress Clinical staff led group instruction and group discussion with PowerPoint presentation and patient guidebook. To enhance the learning environment the use of posters, models and videos may be added. At the conclusion of this workshop, patients will be able to understand the types of stress reactions, differentiate between acute and chronic stress, and recognize the impact that chronic stress has on their health. They will also be able to apply different coping mechanisms, such as reframing negative self-talk. Patients will have the opportunity to practice a variety of stress management techniques, such as deep abdominal breathing, progressive muscle relaxation, and/or guided imagery.  The purpose of this lesson is to educate patients  on the role of stress in their lives and to provide healthy techniques for coping with it.  Learning Barriers/Preferences:  Learning Barriers/Preferences - 08/29/23 1114       Learning Barriers/Preferences   Learning Barriers Hearing   Some hearing loss. Wears glasses.   Learning Preferences Skilled Demonstration          Education Topics:  Knowledge Questionnaire Score:  Knowledge Questionnaire Score - 08/29/23 1421       Knowledge Questionnaire Score   Pre Score 27/28          Core Components/Risk Factors/Patient Goals at Admission:  Personal Goals and Risk Factors at Admission - 08/29/23 1144       Core Components/Risk Factors/Patient Goals on Admission    Weight Management Yes;Obesity;Weight Loss    Intervention Weight Management/Obesity: Establish reasonable short  term and long term weight goals.;Obesity: Provide education and appropriate resources to help participant work on and attain dietary goals.    Admit Weight 238 lb 12.1 oz (108.3 kg)    Expected Outcomes Short Term: Continue to assess and modify interventions until short term weight is achieved;Long Term: Adherence to nutrition and physical activity/exercise program aimed toward attainment of established weight goal;Weight Loss: Understanding of general recommendations for a balanced deficit meal plan, which promotes 1-2 lb weight loss per week and includes a negative energy balance of [phone] kcal/d    Tobacco Cessation Yes    Number of packs per day 0.3    Intervention Assist the participant in steps to quit. Provide individualized education and counseling about committing to Tobacco Cessation, relapse prevention, and pharmacological support that can be provided by physician.;Education officer, environmental, assist with locating and accessing local/national Quit Smoking programs, and support quit date choice.    Expected Outcomes Short Term: Will demonstrate readiness to quit, by selecting a quit date.;Long Term: Complete abstinence from all tobacco products for at least 12 months from quit date.;Short Term: Will quit all tobacco product use, adhering to prevention of relapse plan.    Hypertension Yes    Intervention Provide education on lifestyle modifcations including regular physical activity/exercise, weight management, moderate sodium restriction and increased consumption of fresh fruit, vegetables, and low fat dairy, alcohol moderation, and smoking cessation.;Monitor prescription use compliance.    Expected Outcomes Short Term: Continued assessment and intervention until BP is < 140/76mm HG in hypertensive participants. < 130/51mm HG in hypertensive participants with diabetes, heart failure or chronic kidney disease.;Long Term: Maintenance of blood pressure at goal levels.    Lipids Yes     Intervention Provide education and support for participant on nutrition & aerobic/resistive exercise along with prescribed medications to achieve LDL 70mg , HDL >40mg .    Expected Outcomes Short Term: Participant states understanding of desired cholesterol values and is compliant with medications prescribed. Participant is following exercise prescription and nutrition guidelines.;Long Term: Cholesterol controlled with medications as prescribed, with individualized exercise RX and with personalized nutrition plan. Value goals: LDL < 70mg , HDL > 40 mg.    Stress Yes    Intervention Offer individual and/or small group education and counseling on adjustment to heart disease, stress management and health-related lifestyle change. Teach and support self-help strategies.;Refer participants experiencing significant psychosocial distress to appropriate mental health specialists for further evaluation and treatment. When possible, include family members and significant others in education/counseling sessions.    Expected Outcomes Short Term: Participant demonstrates changes in health-related behavior, relaxation and other stress management skills, ability to obtain effective social support, and compliance with psychotropic  medications if prescribed.;Long Term: Emotional wellbeing is indicated by absence of clinically significant psychosocial distress or social isolation.          Core Components/Risk Factors/Patient Goals Review:   Goals and Risk Factor Review     Row Name 09/19/23 1529             Core Components/Risk Factors/Patient Goals Review   Personal Goals Review Weight Management/Obesity;Lipids;Hypertension;Stress;Tobacco Cessation       Review [name] has been doing well with exercise at cardiac rehab. vital signs have been stable. [name] has lost 2.3 kg since starting cardiac rehab.       Expected Outcomes [name] will continue to participate in exercise at cardiac rehab for exercise, nutrition and  lifestyle modifications.          Core Components/Risk Factors/Patient Goals at Discharge (Final Review):   Goals and Risk Factor Review - 09/19/23 1529       Core Components/Risk Factors/Patient Goals Review   Personal Goals Review Weight Management/Obesity;Lipids;Hypertension;Stress;Tobacco Cessation    Review [name] has been doing well with exercise at cardiac rehab. vital signs have been stable. [name] has lost 2.3 kg since starting cardiac rehab.    Expected Outcomes [name] will continue to participate in exercise at cardiac rehab for exercise, nutrition and lifestyle modifications.          ITP Comments:  ITP Comments     Row Name 08/29/23 1030 09/04/23 1136 09/13/23 1705 09/19/23 1526     ITP Comments Medical Director- Dr. Wilbert Bihari, MD. Introduction to the [facility] Education / Intensive Cardiac Rehab Program. [name] tolerated exercise well without symptoms. Acclimated well to the exercise routine. 30 Day ITP Review. [name] started cardiac rehab on 09/04/23 and is off to a good start to exercise. 30 Day ITP Review. [name] has good attendance and participation with exercise at cardiac rehab       Comments: See ITP Comments

## 2023-09-27 ENCOUNTER — Encounter (HOSPITAL_COMMUNITY)
Admission: RE | Admit: 2023-09-27 | Discharge: 2023-09-27 | Disposition: A | Discharge status: Home / Self Care | Source: Ambulatory Visit | Attending: Cardiology | Admitting: Cardiology

## 2023-09-27 DIAGNOSIS — I2111 ST elevation (STEMI) myocardial infarction involving right coronary artery: Secondary | ICD-10-CM

## 2023-09-27 DIAGNOSIS — Z955 Presence of coronary angioplasty implant and graft: Secondary | ICD-10-CM

## 2023-09-29 ENCOUNTER — Encounter (HOSPITAL_COMMUNITY)
Admission: RE | Admit: 2023-09-29 | Discharge: 2023-09-29 | Disposition: A | Discharge status: Home / Self Care | Source: Ambulatory Visit | Attending: Cardiology | Admitting: Cardiology

## 2023-09-29 DIAGNOSIS — Z955 Presence of coronary angioplasty implant and graft: Secondary | ICD-10-CM

## 2023-09-29 DIAGNOSIS — I2111 ST elevation (STEMI) myocardial infarction involving right coronary artery: Secondary | ICD-10-CM | POA: Diagnosis not present

## 2023-10-02 ENCOUNTER — Encounter (HOSPITAL_COMMUNITY)
Admission: RE | Admit: 2023-10-02 | Discharge: 2023-10-02 | Disposition: A | Discharge status: Home / Self Care | Source: Ambulatory Visit | Attending: Cardiology | Admitting: Cardiology

## 2023-10-02 DIAGNOSIS — I2111 ST elevation (STEMI) myocardial infarction involving right coronary artery: Secondary | ICD-10-CM | POA: Diagnosis not present

## 2023-10-02 DIAGNOSIS — Z955 Presence of coronary angioplasty implant and graft: Secondary | ICD-10-CM

## 2023-10-02 NOTE — Addendum Note (Signed)
 Addended by: BILLY CAMELIA CROME on: 10/02/2023 08:24 AM   Modules accepted: Orders

## 2023-10-04 ENCOUNTER — Encounter (HOSPITAL_COMMUNITY)

## 2023-10-04 ENCOUNTER — Telehealth (HOSPITAL_COMMUNITY): Payer: Self-pay | Admitting: *Deleted

## 2023-10-04 NOTE — Telephone Encounter (Signed)
 Manuel Pearson left voice mail message this morning to inform us  he will be out today due to being sick.

## 2023-10-04 NOTE — Telephone Encounter (Signed)
 Spoke with Manuel Pearson has has a cough and feels achy. Advised to be symptom free for 48 hour's before coming back to group exercise. Patient was advised to follow up with PCP if symptoms persist. [name] states understanding. Will cancel Friday's appointment.[name] RN BSN

## 2023-10-05 LAB — LIPID PANEL

## 2023-10-06 ENCOUNTER — Encounter (HOSPITAL_COMMUNITY)

## 2023-10-06 LAB — COMPREHENSIVE METABOLIC PANEL WITH GFR
ALT: 30 IU/L (ref 0–44)
AST: 20 IU/L (ref 0–40)
Albumin: 4.4 g/dL (ref 3.8–4.9)
Alkaline Phosphatase: 125 IU/L — AB (ref 44–121)
BUN/Creatinine Ratio: 9 (ref 9–20)
BUN: 7 mg/dL (ref 6–24)
Bilirubin Total: 0.3 mg/dL (ref 0.0–1.2)
CO2: 18 mmol/L — AB (ref 20–29)
Calcium: 9.1 mg/dL (ref 8.7–10.2)
Chloride: 93 mmol/L — ABNORMAL LOW (ref 96–106)
Creatinine, Ser: 0.78 mg/dL (ref 0.76–1.27)
Globulin, Total: 2.3 g/dL (ref 1.5–4.5)
Glucose: 96 mg/dL (ref 70–99)
Potassium: 4.8 mmol/L (ref 3.5–5.2)
Sodium: 129 mmol/L — ABNORMAL LOW (ref 134–144)
Total Protein: 6.7 g/dL (ref 6.0–8.5)
eGFR: 103 mL/min/1.73 (ref >59)

## 2023-10-06 LAB — LIPID PANEL
Cholesterol, Total: 74 mg/dL — AB (ref 100–199)
HDL: 29 mg/dL — AB (ref >39)
LDL CALC COMMENT:: 2.6 ratio (ref 0.0–5.0)
LDL Chol Calc (NIH): 30 mg/dL (ref 0–99)
Triglycerides: 65 mg/dL (ref 0–149)
VLDL Cholesterol Cal: 15 mg/dL (ref 5–40)

## 2023-10-09 ENCOUNTER — Encounter (HOSPITAL_COMMUNITY)
Admission: RE | Admit: 2023-10-09 | Discharge: 2023-10-09 | Disposition: A | Discharge status: Home / Self Care | Source: Ambulatory Visit | Attending: Cardiology | Admitting: Cardiology

## 2023-10-09 DIAGNOSIS — I2111 ST elevation (STEMI) myocardial infarction involving right coronary artery: Secondary | ICD-10-CM

## 2023-10-09 DIAGNOSIS — Z955 Presence of coronary angioplasty implant and graft: Secondary | ICD-10-CM

## 2023-10-11 ENCOUNTER — Encounter (HOSPITAL_COMMUNITY)
Admission: RE | Admit: 2023-10-11 | Discharge: 2023-10-11 | Disposition: A | Discharge status: Home / Self Care | Source: Ambulatory Visit | Attending: Cardiology | Admitting: Cardiology

## 2023-10-11 DIAGNOSIS — Z955 Presence of coronary angioplasty implant and graft: Secondary | ICD-10-CM

## 2023-10-11 DIAGNOSIS — I2111 ST elevation (STEMI) myocardial infarction involving right coronary artery: Secondary | ICD-10-CM

## 2023-10-13 ENCOUNTER — Encounter (HOSPITAL_COMMUNITY)
Admission: RE | Admit: 2023-10-13 | Discharge: 2023-10-13 | Disposition: A | Discharge status: Home / Self Care | Source: Ambulatory Visit | Attending: Cardiology | Admitting: Cardiology

## 2023-10-13 DIAGNOSIS — I2111 ST elevation (STEMI) myocardial infarction involving right coronary artery: Secondary | ICD-10-CM

## 2023-10-13 DIAGNOSIS — I252 Old myocardial infarction: Secondary | ICD-10-CM | POA: Diagnosis not present

## 2023-10-13 DIAGNOSIS — Z955 Presence of coronary angioplasty implant and graft: Secondary | ICD-10-CM | POA: Insufficient documentation

## 2023-10-13 DIAGNOSIS — Z48812 Encounter for surgical aftercare following surgery on the circulatory system: Secondary | ICD-10-CM | POA: Insufficient documentation

## 2023-10-16 ENCOUNTER — Encounter (HOSPITAL_COMMUNITY)
Admission: RE | Admit: 2023-10-16 | Discharge: 2023-10-16 | Disposition: A | Discharge status: Home / Self Care | Source: Ambulatory Visit | Attending: Cardiology | Admitting: Cardiology

## 2023-10-16 DIAGNOSIS — I2111 ST elevation (STEMI) myocardial infarction involving right coronary artery: Secondary | ICD-10-CM

## 2023-10-16 DIAGNOSIS — Z955 Presence of coronary angioplasty implant and graft: Secondary | ICD-10-CM

## 2023-10-16 DIAGNOSIS — Z48812 Encounter for surgical aftercare following surgery on the circulatory system: Secondary | ICD-10-CM | POA: Diagnosis not present

## 2023-10-17 ENCOUNTER — Other Ambulatory Visit: Payer: Self-pay

## 2023-10-17 MED ORDER — PROPRANOLOL HCL ER BEADS 120 MG PO CP24: 120.0000 mg | ORAL_CAPSULE | Freq: Every day | ORAL | 3 refills | Status: DC

## 2023-10-17 NOTE — Progress Notes (Signed)
 I discontinued the Aspirin  and the Metoprolol  tartrate. As well as put in an order for his Propranolol  120 mg daily, sent to the preferred pharmacy.

## 2023-10-17 NOTE — Telephone Encounter (Signed)
 Restart Propranolol  XL 120 mg he was previously on and stop Metoprolol  tartrate 50 mg BID. He can stop ASA and continue Effient  only

## 2023-10-18 ENCOUNTER — Encounter (HOSPITAL_COMMUNITY)
Admission: RE | Admit: 2023-10-18 | Discharge: 2023-10-18 | Disposition: A | Discharge status: Home / Self Care | Source: Ambulatory Visit | Attending: Cardiology | Admitting: Cardiology

## 2023-10-18 DIAGNOSIS — I2111 ST elevation (STEMI) myocardial infarction involving right coronary artery: Secondary | ICD-10-CM

## 2023-10-18 DIAGNOSIS — Z48812 Encounter for surgical aftercare following surgery on the circulatory system: Secondary | ICD-10-CM | POA: Diagnosis not present

## 2023-10-18 DIAGNOSIS — Z955 Presence of coronary angioplasty implant and graft: Secondary | ICD-10-CM

## 2023-10-20 ENCOUNTER — Encounter (HOSPITAL_COMMUNITY)
Admission: RE | Admit: 2023-10-20 | Discharge: 2023-10-20 | Disposition: A | Discharge status: Home / Self Care | Source: Ambulatory Visit | Attending: Cardiology | Admitting: Cardiology

## 2023-10-20 DIAGNOSIS — I2111 ST elevation (STEMI) myocardial infarction involving right coronary artery: Secondary | ICD-10-CM

## 2023-10-20 DIAGNOSIS — Z955 Presence of coronary angioplasty implant and graft: Secondary | ICD-10-CM

## 2023-10-20 DIAGNOSIS — Z48812 Encounter for surgical aftercare following surgery on the circulatory system: Secondary | ICD-10-CM | POA: Diagnosis not present

## 2023-10-23 ENCOUNTER — Encounter (HOSPITAL_COMMUNITY)
Admission: RE | Admit: 2023-10-23 | Discharge: 2023-10-23 | Disposition: A | Discharge status: Home / Self Care | Source: Ambulatory Visit | Attending: Cardiology | Admitting: Cardiology

## 2023-10-23 DIAGNOSIS — Z955 Presence of coronary angioplasty implant and graft: Secondary | ICD-10-CM

## 2023-10-23 DIAGNOSIS — I2111 ST elevation (STEMI) myocardial infarction involving right coronary artery: Secondary | ICD-10-CM

## 2023-10-24 NOTE — Progress Notes (Signed)
 Cardiac Individual Treatment Plan  Patient Details  Name: Manuel Pearson MRN: [id number] Date of Birth: [birth year] Referring Provider:   Flowsheet Row INTENSIVE CARDIAC REHAB ORIENT from 08/29/2023 in [facility] for Heart, Vascular, & Lung Health  Referring Provider Ladona Heinz, MD    Initial Encounter Date:  Flowsheet Row INTENSIVE CARDIAC REHAB ORIENT from 08/29/2023 in [facility] for Heart, Vascular, & Lung Health  Date 08/29/23    Visit Diagnosis: 07/03/23 STEMI  07/03/23 DES pRCA  Patient's Home Medications on Admission:  Current Outpatient Medications:    atorvastatin  (LIPITOR) 80 MG tablet, Take 1 tablet (80 mg total) by mouth daily., Disp: 30 tablet, Rfl: 5   cyclobenzaprine  (FLEXERIL ) 10 MG tablet, Take 10 mg by mouth at bedtime., Disp: , Rfl:    Erenumab-aooe (AIMOVIG) 70 MG/ML SOAJ, Inject into the skin every 30 (thirty) days., Disp: , Rfl:    ezetimibe (ZETIA) 10 MG tablet, Take 10 mg by mouth daily., Disp: , Rfl:    HYDROcodone -acetaminophen  (NORCO/VICODIN) 5-325 MG tablet, Take 1 tablet by mouth every 4 (four) hours as needed for moderate pain., Disp: 30 tablet, Rfl: 0   levETIRAcetam  (KEPPRA ) 750 MG tablet, Take 750 mg by mouth 4 (four) times daily., Disp: , Rfl:    levothyroxine  (SYNTHROID , LEVOTHROID) 50 MCG tablet, Take 50 mcg by mouth daily before breakfast., Disp: , Rfl:    loratadine  (CLARITIN ) 10 MG tablet, Take 10 mg by mouth daily at 12 noon., Disp: , Rfl:    Multiple Vitamin (MULITIVITAMIN WITH MINERALS) TABS, Take 1 tablet by mouth daily. , Disp: , Rfl:    nitroGLYCERIN  (NITROSTAT ) 0.4 MG SL tablet, Place 1 tablet (0.4 mg total) under the tongue every 5 (five) minutes as needed for chest pain (up to 3 doses. If taking 3rd dose call 911)., Disp: 25 tablet, Rfl: 3   omeprazole (PRILOSEC) 20 MG capsule, Take 20 mg by mouth daily., Disp: , Rfl:    OXcarbazepine  (TRILEPTAL ) 150 MG tablet, Take 225-450 mg by mouth See  admin instructions. PATIENT TAKES 225 MG by mouth  TID AND 450 MG by mouth  AT BEDTIME PER PATIENT, Disp: , Rfl:    prasugrel  (EFFIENT ) 10 MG TABS tablet, Take 1 tablet (10 mg total) by mouth daily., Disp: 30 tablet, Rfl: 11   prazosin  (MINIPRESS ) 2 MG capsule, Take 2 mg by mouth at bedtime. , Disp: , Rfl:    propranolol  (INNOPRAN  XL) 120 MG 24 hr capsule, Take 1 capsule (120 mg total) by mouth at bedtime., Disp: 90 capsule, Rfl: 3   sodium chloride  1 g tablet, Take 1 g by mouth 2 (two) times daily with a meal., Disp: , Rfl:    traZODone  (DESYREL ) 150 MG tablet, Take 75 mg by mouth at bedtime., Disp: , Rfl:   Past Medical History: 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   Sleep apnea 2019   uses CPAP nightly    Tobacco Use: Social History   Tobacco Use  Smoking Status Every Day   Current packs/day: 0.50   Average packs/day: 0.5 packs/day for 40.0 years (20.0 ttl pk-yrs)   Types: Cigarettes  Smokeless Tobacco Never    Labs: Review Flowsheet       Latest Ref Rng & Units 07/02/2023 09/21/2023 10/05/2023  Labs for ITP Cardiac and Pulmonary Rehab  Cholestrol 100 - 199 mg/dL 843  65  74   LDL (calc) 0 - 99 mg/dL 898  Comment  30   HDL-C >39 mg/dL 34  54  29   Trlycerides 0 - 149 mg/dL 896  62  65   TCO2 22 - 32 mmol/L 25  - -    Capillary Blood Glucose: Lab Results  Component Value Date   GLUCAP 104 (H) 07/02/2023     Exercise Target Goals: Exercise Program Goal: Individual exercise prescription set using results from initial 6 min walk test and THRR while considering  patient's  activity barriers and safety.   Exercise Prescription Goal: Initial exercise prescription builds to 30-45 minutes a day of aerobic activity, 2-3 days per week.  Home exercise guidelines will be given to patient during program as part of exercise prescription that the participant will acknowledge.  Activity Barriers & Risk Stratification:  Activity Barriers & Cardiac Risk Stratification - 08/29/23 1114       Activity Barriers & Cardiac Risk Stratification   Activity Barriers Arthritis;Back Problems;Joint Problems;Other (comment)    Comments Multiple back surgeries, right ankle fx and surgery x2, spinal stenosis, hx seizures, shoulder surgery.    Cardiac Risk Stratification High          6 Minute Walk:  6 Minute Walk     Row Name 08/29/23 1212         6 Minute Walk   Phase Initial     Distance 1710 feet     Walk Time 6 minutes     # of Rest Breaks 0     MPH 3.24     METS 3.73     RPE 15     Perceived Dyspnea  1     VO2 Peak 13.05     Symptoms Yes (comment)     Comments Mild shortness of breath. Bilateral calf burning.     Resting HR 70 bpm     Exercise Oxygen Saturation  during 6 min walk 96 %     Max Ex. HR 82 bpm     Max Ex. BP 166/84     2 Minute Post BP 138/72        Oxygen Initial Assessment:   Oxygen Re-Evaluation:   Oxygen Discharge (Final Oxygen Re-Evaluation):   Initial Exercise Prescription:  Initial Exercise Prescription - 08/29/23 1400       Date of Initial Exercise RX and Referring Provider   Date 08/29/23    Referring Provider Ladona Heinz, MD    Expected Discharge Date 11/22/23      Recumbant Bike   Level 1    RPM 50    Watts 25    Minutes 15    METs 2.3      NuStep   Level 1    SPM 85    Minutes 15    METs 2.3      Prescription Details   Frequency (times per week) 3    Duration Progress to 30 minutes of continuous aerobic without signs/symptoms of physical distress      Intensity   THRR 40-80% of Max Heartrate 64-129  Ratings of Perceived Exertion 11-13    Perceived Dyspnea 0-4      Progression   Progression Continue to progress workloads to maintain intensity without signs/symptoms of physical distress.      Resistance Training   Training Prescription Yes    Weight 3 lbs    Reps 10-15          Perform Capillary Blood Glucose checks as needed.  Exercise Prescription Changes:   Exercise Prescription Changes     Row Name 09/04/23 1028 09/13/23 1033 09/25/23 1034 10/16/23 1038       Response to Exercise   Blood Pressure (Admit) 112/62 106/68 116/66 106/60    Blood Pressure (Exercise) 140/80 140/80 160/80 --    Blood Pressure (Exit) 98/60 108/70 102/66 101/66    Heart Rate (Admit) 63 bpm 67 bpm 55 bpm 54 bpm    Heart Rate (Exercise) 96 bpm 104 bpm 102 bpm 118 bpm    Heart Rate (Exit) 73 bpm 76 bpm 68 bpm 80 bpm    Rating of Perceived Exertion (Exercise) 11 12 11 11     Symptoms None None None None    Comments Off to a great start with exercise. -- -- --    Duration Continue with 30 min of aerobic exercise without signs/symptoms of physical distress. Continue with 30 min of aerobic exercise without signs/symptoms of physical distress. Continue with 30 min of aerobic exercise without signs/symptoms of physical distress. Continue with 30 min of aerobic exercise without signs/symptoms of physical distress.    Intensity THRR unchanged THRR unchanged THRR unchanged THRR unchanged      Progression   Progression Continue to progress workloads to maintain intensity without signs/symptoms of physical distress. Continue to progress workloads to maintain intensity without signs/symptoms of physical distress. Continue to progress workloads to maintain intensity without signs/symptoms of physical distress. Continue to progress workloads to maintain intensity without signs/symptoms of physical distress.    Average METs 4.4 3.7 4 5       Resistance Training   Training Prescription Yes No Yes Yes    Weight 5  lbs Relaxation day, no weights. 5 lbs 5 lbs    Reps 10-15 -- 10-15 10-15    Time 10 Minutes -- 5 Minutes 5 Minutes      Interval Training   Interval Training No No No No      Bike   Level 4.5 4.5 4.5 5    Watts 72 72 91 110    Minutes 15 15 15 15     METs 6.5 4.1 4.5 5.3      Recumbant Bike   Level 1 -- -- --    RPM 83 -- -- --    Watts 18 -- -- --    Minutes 15 -- -- --    METs 2.4 -- -- --      NuStep   Level -- 3 4 6     SPM -- 80 135 126    Minutes -- 15 15 15     METs -- 3.4 3.6 4.7      Home Exercise Plan   Plans to continue exercise at -- -- Home (comment) Home (comment)    Frequency -- -- Add 2 additional days to program exercise sessions. Add 2 additional days to program exercise sessions.    Initial Home Exercises Provided -- -- 09/22/23 09/22/23       Exercise Comments:   Exercise Comments     Row Name 09/04/23 1136 09/18/23 1102 09/22/23 1103  10/18/23 1057     Exercise Comments Manuel Pearson tolerated mild to moderate intensity well without symptoms. Oriented to the exercise equipment and stretching routine. Increased workload on bike and was able to use the 5 lb hand weights without issue. Reviewed METs with Manuel Pearson. Reviewed home exercise guidelines, METs, and goals with Manuel Pearson. Reviewed METs and goals with Manuel Pearson.       Exercise Goals and Review:   Exercise Goals     Row Name 08/29/23 1114             Exercise Goals   Increase Physical Activity Yes       Intervention Provide advice, education, support and counseling about physical activity/exercise needs.;Develop an individualized exercise prescription for aerobic and resistive training based on initial evaluation findings, risk stratification, comorbidities and participant's personal goals.       Expected Outcomes Short Term: Attend rehab on a regular basis to increase amount of physical activity.;Long Term: Exercising regularly at least 3-5 days a week.;Long Term: Add in home exercise to make exercise part of  routine and to increase amount of physical activity.       Increase Strength and Stamina Yes       Intervention Provide advice, education, support and counseling about physical activity/exercise needs.;Develop an individualized exercise prescription for aerobic and resistive training based on initial evaluation findings, risk stratification, comorbidities and participant's personal goals.       Expected Outcomes Short Term: Increase workloads from initial exercise prescription for resistance, speed, and METs.;Short Term: Perform resistance training exercises routinely during rehab and add in resistance training at home;Long Term: Improve cardiorespiratory fitness, muscular endurance and strength as measured by increased METs and functional capacity ( )       Able to understand and use rate of perceived exertion (RPE) scale Yes       Intervention Provide education and explanation on how to use RPE scale       Expected Outcomes Short Term: Able to use RPE daily in rehab to express subjective intensity level;Long Term:  Able to use RPE to guide intensity level when exercising independently       Knowledge and understanding of Target Heart Rate Range (THRR) Yes       Intervention Provide education and explanation of THRR including how the numbers were predicted and where they are located for reference       Expected Outcomes Short Term: Able to state/look up THRR;Long Term: Able to use THRR to govern intensity when exercising independently;Short Term: Able to use daily as guideline for intensity in rehab       Able to check pulse independently Yes       Intervention Provide education and demonstration on how to check pulse in carotid and radial arteries.;Review the importance of being able to check your own pulse for safety during independent exercise       Expected Outcomes Short Term: Able to explain why pulse checking is important during independent exercise;Long Term: Able to check pulse independently  and accurately       Understanding of Exercise Prescription Yes       Intervention Provide education, explanation, and written materials on patient's individual exercise prescription       Expected Outcomes Short Term: Able to explain program exercise prescription;Long Term: Able to explain home exercise prescription to exercise independently          Exercise Goals Re-Evaluation :  Exercise Goals Re-Evaluation     Row Name 09/04/23 1136 09/22/23  1103 10/18/23 1057         Exercise Goal Re-Evaluation   Exercise Goals Review Increase Physical Activity;Increase Strength and Stamina;Able to understand and use rate of perceived exertion (RPE) scale Increase Physical Activity;Increase Strength and Stamina;Able to understand and use rate of perceived exertion (RPE) scale;Knowledge and understanding of Target Heart Rate Range (THRR);Able to check pulse independently;Understanding of Exercise Prescription Increase Physical Activity;Increase Strength and Stamina;Able to understand and use rate of perceived exertion (RPE) scale;Knowledge and understanding of Target Heart Rate Range (THRR);Able to check pulse independently;Understanding of Exercise Prescription     Comments Manuel Pearson was able to understand and use RPE scale appropriately. Reviewed exercise prescription with Manuel Pearson. He is not currently walking for exercise because he has plates in his lower leg/ankle which cause soreness/ pain/ numbness. Discussed seated stretches as an alternative to weight bearing exercise that bothers his leg. Handout given on seated aerobic exercise that can be done at home. Manuel Pearson continues to make great progess with exercise. He's currently achieving 5.0 METs with exercise. He will increase hand weights from 5  to 6 lbs next session.     Expected Outcomes Progress workloads as tolerated to help increase strength and endurance. Continue to progress workloads. Introduce seated aerobic exercise at home 1-2 days/week at home.  Continue to progess workloads to increase strength and stamina.        Discharge Exercise Prescription (Final Exercise Prescription Changes):  Exercise Prescription Changes - 10/16/23 1038       Response to Exercise   Blood Pressure (Admit) 106/60    Blood Pressure (Exit) 101/66    Heart Rate (Admit) 54 bpm    Heart Rate (Exercise) 118 bpm    Heart Rate (Exit) 80 bpm    Rating of Perceived Exertion (Exercise) 11    Symptoms None    Duration Continue with 30 min of aerobic exercise without signs/symptoms of physical distress.    Intensity THRR unchanged      Progression   Progression Continue to progress workloads to maintain intensity without signs/symptoms of physical distress.    Average METs 5      Resistance Training   Training Prescription Yes    Weight 5 lbs    Reps 10-15    Time 5 Minutes      Interval Training   Interval Training No      Bike   Level 5    Watts 110    Minutes 15    METs 5.3      NuStep   Level 6    SPM 126    Minutes 15    METs 4.7      Home Exercise Plan   Plans to continue exercise at Home (comment)    Frequency Add 2 additional days to program exercise sessions.    Initial Home Exercises Provided 09/22/23          Nutrition:  Target Goals: Understanding of nutrition guidelines, daily intake of sodium 1500mg , cholesterol 200mg , calories 30% from fat and 7% or less from saturated fats, daily to have 5 or more servings of fruits and vegetables.  Biometrics:  Pre Biometrics - 08/29/23 1030       Pre Biometrics   Waist Circumference 47 inches    Hip Circumference 45 inches    Waist to Hip Ratio 1.04 %    Triceps Skinfold 20 mm    % Body Fat 35.1 %    Grip Strength 29 kg    Flexibility --  Not performed. Mulitple back surgery, chronic pain.   Single Leg Stand 30 seconds           Nutrition Therapy Plan and Nutrition Goals:  Nutrition Therapy & Goals - 10/02/23 1152       Nutrition Therapy   Diet Heart Healthy  Diet    Drug/Food Interactions Statins/Certain Fruits      Personal Nutrition Goals   Nutrition Goal Patient to identify strategies for reducing cardiovascular risk by attending the [employer] education and nutrition series weekly   goal in progress.   Personal Goal #2 Patient to improve diet quality by using the plate method as a guide for meal planning to include lean protein/plant protein, fruits, vegetables, whole grains, nonfat dairy as part of a well-balanced diet.   goal in progress.   Comments Goals in progress. 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 He does take salt tablets due to chronic hyponatremia; patient reports this is caused by his seizure medications. He reports making some dietary changes including reduced processed foods, decreased simple sugars, and increased lean proteins. A1c is in a prediabetic range. LDL is at goal. He is down 5.1# since starting with our program. He continues to work on smoking cessation. Patient will benefit from participation in intensive cardiac rehab for nutrition, exercise, and lifestyle modification.      Intervention Plan   Intervention Prescribe, educate and counsel regarding individualized specific dietary modifications aiming towards targeted core components such as weight, hypertension, lipid management, diabetes, heart failure and other comorbidities.;Nutrition handout(s) given to patient.    Expected Outcomes Short Term Goal: Understand basic principles of dietary content, such as calories, fat, sodium, cholesterol and nutrients.;Long Term Goal: Adherence to prescribed nutrition plan.          Nutrition Assessments:  Nutrition Assessments - 09/04/23 1059       Rate Your Plate Scores   Pre Score 50         MEDIFICTS Score Key: >=70 Need to make dietary changes  40-70 Heart Healthy Diet <= 40  Therapeutic Level Cholesterol Diet   Flowsheet Row INTENSIVE CARDIAC REHAB from 09/04/2023 in [facility] for Heart, Vascular, & Lung Health  Picture Your Plate Total Score on Admission 50   Picture Your Plate Scores: <59 Unhealthy dietary pattern with much room for improvement. 41-50 Dietary pattern unlikely to meet recommendations for good health and room for improvement. 51-60 More healthful dietary pattern, with some room for improvement.  >60 Healthy dietary pattern, although there may be some specific behaviors that could be improved.    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 He does take salt tablets due to chronic hyponatremia; patient reports this is caused by his seizure medications. He reports making some dietary changes including reduced processed foods and increased lean proteins. A1c is in a prediabetic range. LDL is at goal.  Patient will benefit from participation in intensive cardiac rehab for nutrition, exercise, and lifestyle modification. Goals in progress. 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 He does take salt tablets due to chronic hyponatremia; patient reports this is caused by his seizure medications. He reports making some dietary changes including reduced processed foods, decreased simple sugars, and increased lean proteins. A1c is in a prediabetic range. LDL is at goal. He is down 5.1#  since starting with our program. He continues to work on smoking cessation. Patient will benefit from participation in intensive cardiac rehab for nutrition, exercise, and lifestyle modification.         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 He does take salt tablets due to chronic hyponatremia; patient reports this is caused by his seizure medications. He reports making some dietary changes including reduced processed foods and increased lean proteins. A1c is in a prediabetic range. LDL is at goal. Patient will benefit from participation in intensive cardiac rehab for nutrition, exercise, and lifestyle modification. Goals in progress. 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 He does take salt tablets due to chronic hyponatremia; patient reports this is caused by his seizure medications. He reports making some dietary changes including reduced processed foods, decreased simple sugars, and increased lean proteins. A1c is in a prediabetic range. LDL is at goal. He is down 5.1# since starting with our program. He continues to work on smoking cessation. Patient will benefit from participation in intensive cardiac rehab for nutrition, exercise, and lifestyle modification.         Nutrition Goals Discharge (Final Nutrition Goals Re-Evaluation):  Nutrition Goals Re-Evaluation - 10/02/23 1152       Goals   Current Weight 233 lb 11 oz (106 kg)     Comment A1c 6.0, HDL 29, LDL 31, Lpa WNL    Expected Outcome Goals in progress. 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 He does take salt tablets due to chronic hyponatremia; patient reports this is caused by his seizure medications. He reports making some dietary changes including reduced processed foods, decreased simple sugars, and increased lean proteins. A1c is in a prediabetic range. LDL is at goal. He is down 5.1# since starting with our program. He continues to work on smoking cessation. Patient will benefit from participation in intensive cardiac rehab for nutrition, exercise, and lifestyle modification.          Psychosocial: Target Goals: Acknowledge presence or absence of significant depression and/or stress, maximize coping skills, provide positive support system. Participant is able to verbalize types and ability to use techniques and skills needed for reducing stress and depression.  Initial Review & Psychosocial Screening:  Initial Psych  Review & Screening - 08/29/23 1110       Initial Review   Current issues with Current Depression;Current Anxiety/Panic;Current Stress Concerns    Source of Stress Concerns Unable to participate in former interests or hobbies;Unable to perform yard/household activities    Comments Unable to do the activities he would like to because of chronic back pain.      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 They're together everyday. Wife also has health concerns.      Barriers   Psychosocial barriers to participate in program The patient should benefit from training in stress management and relaxation.;Psychosocial barriers identified (see note)      Screening  Interventions   Interventions Encouraged to exercise;To provide support and resources with identified psychosocial needs;Provide feedback about the scores to participant    Expected Outcomes Short Term goal: Utilizing psychosocial counselor, staff and physician to assist with identification of specific Stressors or current issues interfering with healing process. Setting desired goal for each stressor or current issue identified.;Long Term Goal: Stressors or current issues are controlled or eliminated.;Short Term goal: Identification and review with participant of any Quality of Life or Depression concerns found by scoring the questionnaire.;Long Term goal: The participant improves quality of Life and PHQ9 Scores as seen by post scores and/or verbalization of changes          Quality of Life Scores:  Quality of Life - 08/29/23 1420       Quality of Life   Select Quality of Life      Quality of Life Scores   Health/Function Pre 18.33 %    Socioeconomic Pre 24.83 %    Psych/Spiritual Pre 17.5 %    Family Pre 27.3 %    GLOBAL Pre 20.7 %         Scores of 19 and below usually indicate a poorer quality of life in these areas.  A difference of  2-3 points is a clinically meaningful difference.  A difference of 2-3 points in the total score of the Quality of Life Index has been associated with significant improvement in overall quality of life, self-image, physical symptoms, and general health in studies assessing change in quality of life.  PHQ-9: Review Flowsheet       08/29/2023 09/21/2014 09/07/2014  Depression screen PHQ 2/9  Decreased Interest 0 0 0  Down, Depressed, Hopeless 0 0 0  PHQ - 2 Score 0 0 0  Altered sleeping 0 - -  Tired, decreased energy 1 - -  Change in appetite 1 - -  Feeling bad or failure about yourself  0 - -  Trouble concentrating 1 - -  Moving slowly or fidgety/restless 0 - -  Suicidal thoughts 0 - -  PHQ-9 Score 3 - -  Difficult doing work/chores Somewhat  difficult - -   Interpretation of Total Score  Total Score Depression Severity:  1-4 = Minimal depression, 5-9 = Mild depression, 10-14 = Moderate depression, 15-19 = Moderately severe depression, 20-27 = Severe depression   Psychosocial Evaluation and Intervention:   Psychosocial Re-Evaluation:  Psychosocial Re-Evaluation     Row Name 09/13/23 1707 10/24/23 1523           Psychosocial Re-Evaluation   Current issues with Current Depression;Current Anxiety/Panic;Current Stress Concerns Current Anxiety/Panic;Current Stress Concerns      Comments Reviewed quality of life. Manuel Pearson denies  being depressed. Manuel Pearson says he has been through a lot just having recent back surgery and now cardiac stenting in April. Manuel Pearson says his energy level has improved some since he has started cardiac rehab Manuel Pearson has not voiced any increased concerns or stressors during exercise at cardiac rehab.      Expected Outcomes Manuel Pearson says he is not currently exeperiencing any depression --      Interventions Stress management education;Relaxation education;Encouraged to attend Cardiac Rehabilitation for the exercise Stress management education;Relaxation education;Encouraged to attend Cardiac Rehabilitation for the exercise      Continue Psychosocial Services  No Follow up required No Follow up required        Initial Review   Source of Stress Concerns Chronic Illness Chronic Illness      Comments Will continue to montior and offer support as needed. Will continue to montior and offer support as needed.         Psychosocial Discharge (Final Psychosocial Re-Evaluation):  Psychosocial Re-Evaluation - 10/24/23 1523       Psychosocial Re-Evaluation   Current issues with Current Anxiety/Panic;Current Stress Concerns    Comments Manuel Pearson has not voiced any increased concerns or stressors during exercise at cardiac rehab.    Interventions Stress management education;Relaxation education;Encouraged to attend Cardiac  Rehabilitation for the exercise    Continue Psychosocial Services  No Follow up required      Initial Review   Source of Stress Concerns Chronic Illness    Comments Will continue to montior and offer support as needed.          Vocational Rehabilitation: Provide vocational rehab assistance to qualifying candidates.   Vocational Rehab Evaluation & Intervention:  Vocational Rehab - 08/29/23 1114       Initial Vocational Rehab Evaluation & Intervention   Assessment shows need for Vocational Rehabilitation No      Vocational Rehab Re-Evaulation   Comments Manuel Pearson is disabled due to chronic back pain, seizures. No VR needs at this time.          Education: Education Goals: Education classes will be provided on a weekly basis, covering required topics. Participant will state understanding/return demonstration of topics presented.    Education     Row Name 09/04/23 1100     Education   Cardiac Education Topics Pritikin   Select Core Videos     Core Videos   Educator Exercise Physiologist   Select Exercise Education   Exercise Education Biomechanial Limitations   Instruction Review Code 1- Verbalizes Understanding   Class Start Time 1155   Class Stop Time 1235   Class Time Calculation (min) 40 min    Row Name 09/08/23 1100     Education   Cardiac Education Topics Pritikin   Select Core Videos     Core Videos   Educator Dietitian   Select Nutrition   Nutrition Vitamins and Minerals   Instruction Review Code 1- Verbalizes Understanding   Class Start Time 1145   Class Stop Time 1225   Class Time Calculation (min) 40 min    Row Name 09/11/23 1600     Education   Cardiac Education Topics Pritikin   Research officer, political party Dietitian   Nutrition Fueling a Healthy Body   Instruction Review Code 1- Verbalizes Understanding   Class Start Time 1145   Class Stop Time 1230   Class Time Calculation (min) 45 min    Row Name 09/13/23 1500  Education   Cardiac Education Topics Pritikin   Customer service manager   Weekly Topic International Cuisine- Spotlight on the [employer] Zones   Instruction Review Code 1- Verbalizes Understanding   Class Start Time 1145   Class Stop Time 1225   Class Time Calculation (min) 40 min    Row Name 09/18/23 1200     Education   Cardiac Education Topics Pritikin   Geographical information systems officer Psychosocial   Psychosocial Workshop Healthy Sleep for a Healthy Heart   Instruction Review Code 1- Verbalizes Understanding   Class Start Time 1150   Class Stop Time 1230   Class Time Calculation (min) 40 min    Row Name 09/20/23 1100     Education   Cardiac Education Topics Pritikin   Customer service manager   Weekly Topic Simple Sides and Sauces   Instruction Review Code 1- Verbalizes Understanding   Class Start Time 1145   Class Stop Time 1225   Class Time Calculation (min) 40 min    Row Name 09/22/23 1200     Education   Cardiac Education Topics Pritikin   Nurse, children's Exercise Physiologist   Select Psychosocial   Psychosocial How Our Thoughts Can Heal Our Hearts   Instruction Review Code 1- Verbalizes Understanding   Class Start Time 1153   Class Stop Time 1241   Class Time Calculation (min) 48 min    Row Name 09/25/23 1200     Education   Cardiac Education Topics Pritikin   Hospital doctor Education   General Education Hypertension and Heart Disease   Instruction Review Code 1- Verbalizes Understanding   Class Start Time 1150   Class Stop Time 1225   Class Time Calculation (min) 35 min    Row Name 09/27/23 1100     Education   Cardiac Education Topics Pritikin   Customer service manager   Weekly Topic  Powerhouse Plant-Based Proteins   Instruction Review Code 1- Verbalizes Understanding   Class Start Time 1145   Class Stop Time 1225   Class Time Calculation (min) 40 min    Row Name 09/29/23 1300     Education   Cardiac Education Topics Pritikin   Select Workshops     Workshops   Educator Exercise Physiologist   Select Exercise   Exercise Workshop Managing Heart Disease: Your Path to a Healthier Heart   Instruction Review Code 1- Verbalizes Understanding   Class Start Time 1148   Class Stop Time 1239   Class Time Calculation (min) 51 min    Row Name 10/02/23 1300     Education   Cardiac Education Topics Pritikin   Geographical information systems officer Psychosocial   Psychosocial Workshop From Head to Heart: The Power of a Healthy Outlook   Instruction Review Code 1- Verbalizes Understanding   Class Start Time 1152   Class Stop Time 1234   Class Time Calculation (min) 42 min    Row Name 10/09/23 1300     Education   Cardiac Education Topics Pritikin   Select  Core Videos     Core Videos   Educator Exercise Physiologist   Select Psychosocial   Psychosocial Healthy Minds, Bodies, Hearts   Instruction Review Code 1- Verbalizes Understanding   Class Start Time 1157   Class Stop Time 1233   Class Time Calculation (min) 36 min    Row Name 10/11/23 1600     Education   Cardiac Education Topics Pritikin   Customer service manager   Weekly Topic Adding Flavor - Sodium-Free   Instruction Review Code 1- Verbalizes Understanding   Class Start Time 1140   Class Stop Time 1215   Class Time Calculation (min) 35 min    Row Name 10/16/23 1500     Education   Cardiac Education Topics Pritikin   Glass blower/designer Nutrition   Nutrition Workshop Label Reading   Instruction Review Code 1- Verbalizes Understanding   Class Start Time 1145   Class Stop  Time 1230   Class Time Calculation (min) 45 min      Core Videos: Exercise    Move It!  Clinical staff conducted group or individual video education with verbal and written material and guidebook.  Patient learns the recommended Pritikin exercise program. Exercise with the goal of living a long, healthy life. Some of the health benefits of exercise include controlled diabetes, healthier blood pressure levels, improved cholesterol levels, improved heart and lung capacity, improved sleep, and better body composition. Everyone should speak with their doctor before starting or changing an exercise routine.  Biomechanical Limitations Clinical staff conducted group or individual video education with verbal and written material and guidebook.  Patient learns how biomechanical limitations can impact exercise and how we can mitigate and possibly overcome limitations to have an impactful and balanced exercise routine.  Body Composition Clinical staff conducted group or individual video education with verbal and written material and guidebook.  Patient learns that body composition (ratio of muscle mass to fat mass) is a key component to assessing overall fitness, rather than body weight alone. Increased fat mass, especially visceral belly fat, can put us  at increased risk for metabolic syndrome, type 2 diabetes, heart disease, and even death. It is recommended to combine diet and exercise (cardiovascular and resistance training) to improve your body composition. Seek guidance from your physician and exercise physiologist before implementing an exercise routine.  Exercise Action Plan Clinical staff conducted group or individual video education with verbal and written material and guidebook.  Patient learns the recommended strategies to achieve and enjoy long-term exercise adherence, including variety, self-motivation, self-efficacy, and positive decision making. Benefits of exercise include fitness, good  health, weight management, more energy, better sleep, less stress, and overall well-being.  Medical   Heart Disease Risk Reduction Clinical staff conducted group or individual video education with verbal and written material and guidebook.  Patient learns our heart is our most vital organ as it circulates oxygen, nutrients, white blood cells, and hormones throughout the entire body, and carries waste away. Data supports a plant-based eating plan like the Pritikin Program for its effectiveness in slowing progression of and reversing heart disease. The video provides a number of recommendations to address heart disease.   Metabolic Syndrome and Belly Fat  Clinical staff conducted group or individual video education with verbal and written material and guidebook.  Patient learns what metabolic syndrome is, how it leads to heart disease, and  how one can reverse it and keep it from coming back. You have metabolic syndrome if you have 3 of the following 5 criteria: abdominal obesity, high blood pressure, high triglycerides, low HDL cholesterol, and high blood sugar.  Hypertension and Heart Disease Clinical staff conducted group or individual video education with verbal and written material and guidebook.  Patient learns that high blood pressure, or hypertension, is very common in the [place] . Hypertension is largely due to excessive salt intake, but other important risk factors include being overweight, physical inactivity, drinking too much alcohol, smoking, and not eating enough potassium from fruits and vegetables. High blood pressure is a leading risk factor for heart attack, stroke, congestive heart failure, dementia, kidney failure, and premature death. Long-term effects of excessive salt intake include stiffening of the arteries and thickening of heart muscle and organ damage. Recommendations include ways to reduce hypertension and the risk of heart disease.  Diseases of Our Time - Focusing on  Diabetes Clinical staff conducted group or individual video education with verbal and written material and guidebook.  Patient learns why the best way to stop diseases of our time is prevention, through food and other lifestyle changes. Medicine (such as prescription pills and surgeries) is often only a Band-Aid on the problem, not a long-term solution. Most common diseases of our time include obesity, type 2 diabetes, hypertension, heart disease, and cancer. The Pritikin Program is recommended and has been proven to help reduce, reverse, and/or prevent the damaging effects of metabolic syndrome.  Nutrition   Overview of the Pritikin Eating Plan  Clinical staff conducted group or individual video education with verbal and written material and guidebook.  Patient learns about the Pritikin Eating Plan for disease risk reduction. The Pritikin Eating Plan emphasizes a wide variety of unrefined, minimally-processed carbohydrates, like fruits, vegetables, whole grains, and legumes. Go, Caution, and Stop food choices are explained. Plant-based and lean animal proteins are emphasized. Rationale provided for low sodium intake for blood pressure control, low added sugars for blood sugar stabilization, and low added fats and oils for coronary artery disease risk reduction and weight management.  Calorie Density  Clinical staff conducted group or individual video education with verbal and written material and guidebook.  Patient learns about calorie density and how it impacts the Pritikin Eating Plan. Knowing the characteristics of the food you choose will help you decide whether those foods will lead to weight gain or weight loss, and whether you want to consume more or less of them. Weight loss is usually a side effect of the Pritikin Eating Plan because of its focus on low calorie-dense foods.  Label Reading  Clinical staff conducted group or individual video education with verbal and written material and  guidebook.  Patient learns about the Pritikin recommended label reading guidelines and corresponding recommendations regarding calorie density, added sugars, sodium content, and whole grains.  Dining Out - Part 1  Clinical staff conducted group or individual video education with verbal and written material and guidebook.  Patient learns that restaurant meals can be sabotaging because they can be so high in calories, fat, sodium, and/or sugar. Patient learns recommended strategies on how to positively address this and avoid unhealthy pitfalls.  Facts on Fats  Clinical staff conducted group or individual video education with verbal and written material and guidebook.  Patient learns that lifestyle modifications can be just as effective, if not more so, as many medications for lowering your risk of heart disease. A Pritikin lifestyle can  help to reduce your risk of inflammation and atherosclerosis (cholesterol build-up, or plaque, in the artery walls). Lifestyle interventions such as dietary choices and physical activity address the cause of atherosclerosis. A review of the types of fats and their impact on blood cholesterol levels, along with dietary recommendations to reduce fat intake is also included.  Nutrition Action Plan  Clinical staff conducted group or individual video education with verbal and written material and guidebook.  Patient learns how to incorporate Pritikin recommendations into their lifestyle. Recommendations include planning and keeping personal health goals in mind as an important part of their success.  Healthy Mind-Set    Healthy Minds, Bodies, Hearts  Clinical staff conducted group or individual video education with verbal and written material and guidebook.  Patient learns how to identify when they are stressed. Video will discuss the impact of that stress, as well as the many benefits of stress management. Patient will also be introduced to stress management techniques.  The way we think, act, and feel has an impact on our hearts.  How Our Thoughts Can Heal Our Hearts  Clinical staff conducted group or individual video education with verbal and written material and guidebook.  Patient learns that negative thoughts can cause depression and anxiety. This can result in negative lifestyle behavior and serious health problems. Cognitive behavioral therapy is an effective method to help control our thoughts in order to change and improve our emotional outlook.  Additional Videos:  Exercise    Improving Performance  Clinical staff conducted group or individual video education with verbal and written material and guidebook.  Patient learns to use a non-linear approach by alternating intensity levels and lengths of time spent exercising to help burn more calories and lose more body fat. Cardiovascular exercise helps improve heart health, metabolism, hormonal balance, blood sugar control, and recovery from fatigue. Resistance training improves strength, endurance, balance, coordination, reaction time, metabolism, and muscle mass. Flexibility exercise improves circulation, posture, and balance. Seek guidance from your physician and exercise physiologist before implementing an exercise routine and learn your capabilities and proper form for all exercise.  Introduction to Yoga  Clinical staff conducted group or individual video education with verbal and written material and guidebook.  Patient learns about yoga, a discipline of the coming together of mind, breath, and body. The benefits of yoga include improved flexibility, improved range of motion, better posture and core strength, increased lung function, weight loss, and positive self-image. Yoga's heart health benefits include lowered blood pressure, healthier heart rate, decreased cholesterol and triglyceride levels, improved immune function, and reduced stress. Seek guidance from your physician and exercise physiologist  before implementing an exercise routine and learn your capabilities and proper form for all exercise.  Medical   Aging: Enhancing Your Quality of Life  Clinical staff conducted group or individual video education with verbal and written material and guidebook.  Patient learns key strategies and recommendations to stay in good physical health and enhance quality of life, such as prevention strategies, having an advocate, securing a Health Care Proxy and Power of Attorney, and keeping a list of medications and system for tracking them. It also discusses how to avoid risk for bone loss.  Biology of Weight Control  Clinical staff conducted group or individual video education with verbal and written material and guidebook.  Patient learns that weight gain occurs because we consume more calories than we burn (eating more, moving less). Even if your body weight is normal, you may have higher ratios of  fat compared to muscle mass. Too much body fat puts you at increased risk for cardiovascular disease, heart attack, stroke, type 2 diabetes, and obesity-related cancers. In addition to exercise, following the Pritikin Eating Plan can help reduce your risk.  Decoding Lab Results  Clinical staff conducted group or individual video education with verbal and written material and guidebook.  Patient learns that lab test reflects one measurement whose values change over time and are influenced by many factors, including medication, stress, sleep, exercise, food, hydration, pre-existing medical conditions, and more. It is recommended to use the knowledge from this video to become more involved with your lab results and evaluate your numbers to speak with your doctor.   Diseases of Our Time - Overview  Clinical staff conducted group or individual video education with verbal and written material and guidebook.  Patient learns that according to the CDC, 50% to 70% of chronic diseases (such as obesity, type 2 diabetes,  elevated lipids, hypertension, and heart disease) are avoidable through lifestyle improvements including healthier food choices, listening to satiety cues, and increased physical activity.  Sleep Disorders Clinical staff conducted group or individual video education with verbal and written material and guidebook.  Patient learns how good quality and duration of sleep are important to overall health and well-being. Patient also learns about sleep disorders and how they impact health along with recommendations to address them, including discussing with a physician.  Nutrition  Dining Out - Part 2 Clinical staff conducted group or individual video education with verbal and written material and guidebook.  Patient learns how to plan ahead and communicate in order to maximize their dining experience in a healthy and nutritious manner. Included are recommended food choices based on the type of restaurant the patient is visiting.   Fueling a Banker conducted group or individual video education with verbal and written material and guidebook.  There is a strong connection between our food choices and our health. Diseases like obesity and type 2 diabetes are very prevalent and are in large-part due to lifestyle choices. The Pritikin Eating Plan provides plenty of food and hunger-curbing satisfaction. It is easy to follow, affordable, and helps reduce health risks.  Menu Workshop  Clinical staff conducted group or individual video education with verbal and written material and guidebook.  Patient learns that restaurant meals can sabotage health goals because they are often packed with calories, fat, sodium, and sugar. Recommendations include strategies to plan ahead and to communicate with the manager, chef, or server to help order a healthier meal.  Planning Your Eating Strategy  Clinical staff conducted group or individual video education with verbal and written material and  guidebook.  Patient learns about the Pritikin Eating Plan and its benefit of reducing the risk of disease. The Pritikin Eating Plan does not focus on calories. Instead, it emphasizes high-quality, nutrient-rich foods. By knowing the characteristics of the foods, we choose, we can determine their calorie density and make informed decisions.  Targeting Your Nutrition Priorities  Clinical staff conducted group or individual video education with verbal and written material and guidebook.  Patient learns that lifestyle habits have a tremendous impact on disease risk and progression. This video provides eating and physical activity recommendations based on your personal health goals, such as reducing LDL cholesterol, losing weight, preventing or controlling type 2 diabetes, and reducing high blood pressure.  Vitamins and Minerals  Clinical staff conducted group or individual video education with verbal and written material and  guidebook.  Patient learns different ways to obtain key vitamins and minerals, including through a recommended healthy diet. It is important to discuss all supplements you take with your doctor.   Healthy Mind-Set    Smoking Cessation  Clinical staff conducted group or individual video education with verbal and written material and guidebook.  Patient learns that cigarette smoking and tobacco addiction pose a serious health risk which affects millions of people. Stopping smoking will significantly reduce the risk of heart disease, lung disease, and many forms of cancer. Recommended strategies for quitting are covered, including working with your doctor to develop a successful plan.  Culinary   Becoming a Set designer conducted group or individual video education with verbal and written material and guidebook.  Patient learns that cooking at home can be healthy, cost-effective, quick, and puts them in control. Keys to cooking healthy recipes will include looking  at your recipe, assessing your equipment needs, planning ahead, making it simple, choosing cost-effective seasonal ingredients, and limiting the use of added fats, salts, and sugars.  Cooking - Breakfast and Snacks  Clinical staff conducted group or individual video education with verbal and written material and guidebook.  Patient learns how important breakfast is to satiety and nutrition through the entire day. Recommendations include key foods to eat during breakfast to help stabilize blood sugar levels and to prevent overeating at meals later in the day. Planning ahead is also a key component.  Cooking - Educational psychologist conducted group or individual video education with verbal and written material and guidebook.  Patient learns eating strategies to improve overall health, including an approach to cook more at home. Recommendations include thinking of animal protein as a side on your plate rather than center stage and focusing instead on lower calorie dense options like vegetables, fruits, whole grains, and plant-based proteins, such as beans. Making sauces in large quantities to freeze for later and leaving the skin on your vegetables are also recommended to maximize your experience.  Cooking - Healthy Salads and Dressing Clinical staff conducted group or individual video education with verbal and written material and guidebook.  Patient learns that vegetables, fruits, whole grains, and legumes are the foundations of the Pritikin Eating Plan. Recommendations include how to incorporate each of these in flavorful and healthy salads, and how to create homemade salad dressings. Proper handling of ingredients is also covered. Cooking - Soups and [employer] - Soups and Desserts Clinical staff conducted group or individual video education with verbal and written material and guidebook.  Patient learns that Pritikin soups and desserts make for easy, nutritious, and delicious snacks  and meal components that are low in sodium, fat, sugar, and calorie density, while high in vitamins, minerals, and filling fiber. Recommendations include simple and healthy ideas for soups and desserts.   Overview     The Pritikin Solution Program Overview Clinical staff conducted group or individual video education with verbal and written material and guidebook.  Patient learns that the results of the Pritikin Program have been documented in more than 100 articles published in peer-reviewed journals, and the benefits include reducing risk factors for (and, in some cases, even reversing) high cholesterol, high blood pressure, type 2 diabetes, obesity, and more! An overview of the three key pillars of the Pritikin Program will be covered: eating well, doing regular exercise, and having a healthy mind-set.  WORKSHOPS  Exercise: Exercise Basics: Building Your Action Plan Clinical staff led group  instruction and group discussion with PowerPoint presentation and patient guidebook. To enhance the learning environment the use of posters, models and videos may be added. At the conclusion of this workshop, patients will comprehend the difference between physical activity and exercise, as well as the benefits of incorporating both, into their routine. Patients will understand the FITT (Frequency, Intensity, Time, and Type) principle and how to use it to build an exercise action plan. In addition, safety concerns and other considerations for exercise and cardiac rehab will be addressed by the presenter. The purpose of this lesson is to promote a comprehensive and effective weekly exercise routine in order to improve patients' overall level of fitness.   Managing Heart Disease: Your Path to a Healthier Heart Clinical staff led group instruction and group discussion with PowerPoint presentation and patient guidebook. To enhance the learning environment the use of posters, models and videos may be added.At the  conclusion of this workshop, patients will understand the anatomy and physiology of the heart. Additionally, they will understand how Pritikin's three pillars impact the risk factors, the progression, and the management of heart disease.  The purpose of this lesson is to provide a high-level overview of the heart, heart disease, and how the Pritikin lifestyle positively impacts risk factors.  Exercise Biomechanics Clinical staff led group instruction and group discussion with PowerPoint presentation and patient guidebook. To enhance the learning environment the use of posters, models and videos may be added. Patients will learn how the structural parts of their bodies function and how these functions impact their daily activities, movement, and exercise. Patients will learn how to promote a neutral spine, learn how to manage pain, and identify ways to improve their physical movement in order to promote healthy living. The purpose of this lesson is to expose patients to common physical limitations that impact physical activity. Participants will learn practical ways to adapt and manage aches and pains, and to minimize their effect on regular exercise. Patients will learn how to maintain good posture while sitting, walking, and lifting.  Balance Training and Fall Prevention  Clinical staff led group instruction and group discussion with PowerPoint presentation and patient guidebook. To enhance the learning environment the use of posters, models and videos may be added. At the conclusion of this workshop, patients will understand the importance of their sensorimotor skills (vision, proprioception, and the vestibular system) in maintaining their ability to balance as they age. Patients will apply a variety of balancing exercises that are appropriate for their current level of function. Patients will understand the common causes for poor balance, possible solutions to these problems, and ways to  modify their physical environment in order to minimize their fall risk. The purpose of this lesson is to teach patients about the importance of maintaining balance as they age and ways to minimize their risk of falling.  WORKSHOPS   Nutrition:  Fueling a Ship broker led group instruction and group discussion with PowerPoint presentation and patient guidebook. To enhance the learning environment the use of posters, models and videos may be added. Patients will review the foundational principles of the Pritikin Eating Plan and understand what constitutes a serving size in each of the food groups. Patients will also learn Pritikin-friendly foods that are better choices when away from home and review make-ahead meal and snack options. Calorie density will be reviewed and applied to three nutrition priorities: weight maintenance, weight loss, and weight gain. The purpose of this lesson is to reinforce (in a  group setting) the key concepts around what patients are recommended to eat and how to apply these guidelines when away from home by planning and selecting Pritikin-friendly options. Patients will understand how calorie density may be adjusted for different weight management goals.  Mindful Eating  Clinical staff led group instruction and group discussion with PowerPoint presentation and patient guidebook. To enhance the learning environment the use of posters, models and videos may be added. Patients will briefly review the concepts of the Pritikin Eating Plan and the importance of low-calorie dense foods. The concept of mindful eating will be introduced as well as the importance of paying attention to internal hunger signals. Triggers for non-hunger eating and techniques for dealing with triggers will be explored. The purpose of this lesson is to provide patients with the opportunity to review the basic principles of the Pritikin Eating Plan, discuss the value of eating mindfully and how to  measure internal cues of hunger and fullness using the Hunger Scale. Patients will also discuss reasons for non-hunger eating and learn strategies to use for controlling emotional eating.  Targeting Your Nutrition Priorities Clinical staff led group instruction and group discussion with PowerPoint presentation and patient guidebook. To enhance the learning environment the use of posters, models and videos may be added. Patients will learn how to determine their genetic susceptibility to disease by reviewing their family history. Patients will gain insight into the importance of diet as part of an overall healthy lifestyle in mitigating the impact of genetics and other environmental insults. The purpose of this lesson is to provide patients with the opportunity to assess their personal nutrition priorities by looking at their family history, their own health history and current risk factors. Patients will also be able to discuss ways of prioritizing and modifying the Pritikin Eating Plan for their highest risk areas  Menu  Clinical staff led group instruction and group discussion with PowerPoint presentation and patient guidebook. To enhance the learning environment the use of posters, models and videos may be added. Using menus brought in from [employer], or printed from [employer], patients will apply the Pritikin dining out guidelines that were presented in the [employer] video. Patients will also be able to practice these guidelines in a variety of provided scenarios. The purpose of this lesson is to provide patients with the opportunity to practice hands-on learning of the Pritikin Dining Out guidelines with actual menus and practice scenarios.  Label Reading Clinical staff led group instruction and group discussion with PowerPoint presentation and patient guidebook. To enhance the learning environment the use of posters, models and videos may be added. Patients will review  and discuss the Pritikin label reading guidelines presented in Pritikin's Label Reading Educational series video. Using fool labels brought in from local grocery stores and markets, patients will apply the label reading guidelines and determine if the packaged food meet the Pritikin guidelines. The purpose of this lesson is to provide patients with the opportunity to review, discuss, and practice hands-on learning of the Pritikin Label Reading guidelines with actual packaged food labels. Cooking School  Pritikin's [employer] are designed to teach patients ways to prepare quick, simple, and affordable recipes at home. The importance of nutrition's role in chronic disease risk reduction is reflected in its emphasis in the overall Pritikin program. By learning how to prepare essential core Pritikin Eating Plan recipes, patients will increase control over what they eat; be able to customize the flavor of foods without the  use of added salt, sugar, or fat; and improve the quality of the food they consume. By learning a set of core recipes which are easily assembled, quickly prepared, and affordable, patients are more likely to prepare more healthy foods at home. These workshops focus on convenient breakfasts, simple entres, side dishes, and desserts which can be prepared with minimal effort and are consistent with nutrition recommendations for cardiovascular risk reduction. Cooking [employer] are taught by a Armed forces logistics/support/administrative officer (RD) who has been trained by the [employer]. The chef or RD has a clear understanding of the importance of minimizing - if not completely eliminating - added fat, sugar, and sodium in recipes. Throughout the series of Cooking School Workshop sessions, patients will learn about healthy ingredients and efficient methods of cooking to build confidence in their capability to prepare    Cooking School weekly topics:  Adding Flavor- Sodium-Free  Fast  and Healthy Breakfasts  Powerhouse Plant-Based Proteins  Satisfying Salads and Dressings  Simple Sides and Sauces  International Cuisine-Spotlight on the [employer] Zones  Delicious Desserts  Savory Soups  [employer] - Meals in a Astronomer Appetizers and Snacks  Comforting Weekend Breakfasts  One-Pot Wonders   Fast Evening Meals  Landscape architect Your Pritikin Plate  WORKSHOPS   Healthy Mindset (Psychosocial):  Focused Goals, Sustainable Changes Clinical staff led group instruction and group discussion with PowerPoint presentation and patient guidebook. To enhance the learning environment the use of posters, models and videos may be added. Patients will be able to apply effective goal setting strategies to establish at least one personal goal, and then take consistent, meaningful action toward that goal. They will learn to identify common barriers to achieving personal goals and develop strategies to overcome them. Patients will also gain an understanding of how our mind-set can impact our ability to achieve goals and the importance of cultivating a positive and growth-oriented mind-set. The purpose of this lesson is to provide patients with a deeper understanding of how to set and achieve personal goals, as well as the tools and strategies needed to overcome common obstacles which may arise along the way.  From Head to Heart: The Power of a Healthy Outlook  Clinical staff led group instruction and group discussion with PowerPoint presentation and patient guidebook. To enhance the learning environment the use of posters, models and videos may be added. Patients will be able to recognize and describe the impact of emotions and mood on physical health. They will discover the importance of self-care and explore self-care practices which may work for them. Patients will also learn how to utilize the 4 C's to cultivate a healthier outlook and better manage stress and  challenges. The purpose of this lesson is to demonstrate to patients how a healthy outlook is an essential part of maintaining good health, especially as they continue their cardiac rehab journey.  Healthy Sleep for a Healthy Heart Clinical staff led group instruction and group discussion with PowerPoint presentation and patient guidebook. To enhance the learning environment the use of posters, models and videos may be added. At the conclusion of this workshop, patients will be able to demonstrate knowledge of the importance of sleep to overall health, well-being, and quality of life. They will understand the symptoms of, and treatments for, common sleep disorders. Patients will also be able to identify daytime and nighttime behaviors which impact sleep, and they will be able to apply these tools to help manage sleep-related  challenges. The purpose of this lesson is to provide patients with a general overview of sleep and outline the importance of quality sleep. Patients will learn about a few of the most common sleep disorders. Patients will also be introduced to the concept of "sleep hygiene," and discover ways to self-manage certain sleeping problems through simple daily behavior changes. Finally, the workshop will motivate patients by clarifying the links between quality sleep and their goals of heart-healthy living.   Recognizing and Reducing Stress Clinical staff led group instruction and group discussion with PowerPoint presentation and patient guidebook. To enhance the learning environment the use of posters, models and videos may be added. At the conclusion of this workshop, patients will be able to understand the types of stress reactions, differentiate between acute and chronic stress, and recognize the impact that chronic stress has on their health. They will also be able to apply different coping mechanisms, such as reframing negative self-talk. Patients will have the opportunity to practice a  variety of stress management techniques, such as deep abdominal breathing, progressive muscle relaxation, and/or guided imagery.  The purpose of this lesson is to educate patients on the role of stress in their lives and to provide healthy techniques for coping with it.  Learning Barriers/Preferences:  Learning Barriers/Preferences - 08/29/23 1114       Learning Barriers/Preferences   Learning Barriers Hearing   Some hearing loss. Wears glasses.   Learning Preferences Skilled Demonstration          Education Topics:  Knowledge Questionnaire Score:  Knowledge Questionnaire Score - 08/29/23 1421       Knowledge Questionnaire Score   Pre Score 27/28          Core Components/Risk Factors/Patient Goals at Admission:  Personal Goals and Risk Factors at Admission - 08/29/23 1144       Core Components/Risk Factors/Patient Goals on Admission    Weight Management Yes;Obesity;Weight Loss    Intervention Weight Management/Obesity: Establish reasonable short term and long term weight goals.;Obesity: Provide education and appropriate resources to help participant work on and attain dietary goals.    Admit Weight 238 lb 12.1 oz (108.3 kg)    Expected Outcomes Short Term: Continue to assess and modify interventions until short term weight is achieved;Long Term: Adherence to nutrition and physical activity/exercise program aimed toward attainment of established weight goal;Weight Loss: Understanding of general recommendations for a balanced deficit meal plan, which promotes 1-2 lb weight loss per week and includes a negative energy balance of [phone] kcal/d    Tobacco Cessation Yes    Number of packs per day 0.3    Intervention Assist the participant in steps to quit. Provide individualized education and counseling about committing to Tobacco Cessation, relapse prevention, and pharmacological support that can be provided by physician.;Education officer, environmental, assist with locating and  accessing local/national Quit Smoking programs, and support quit date choice.    Expected Outcomes Short Term: Will demonstrate readiness to quit, by selecting a quit date.;Long Term: Complete abstinence from all tobacco products for at least 12 months from quit date.;Short Term: Will quit all tobacco product use, adhering to prevention of relapse plan.    Hypertension Yes    Intervention Provide education on lifestyle modifcations including regular physical activity/exercise, weight management, moderate sodium restriction and increased consumption of fresh fruit, vegetables, and low fat dairy, alcohol moderation, and smoking cessation.;Monitor prescription use compliance.    Expected Outcomes Short Term: Continued assessment and intervention until BP is < 140/31mm HG  in hypertensive participants. < 130/64mm HG in hypertensive participants with diabetes, heart failure or chronic kidney disease.;Long Term: Maintenance of blood pressure at goal levels.    Lipids Yes    Intervention Provide education and support for participant on nutrition & aerobic/resistive exercise along with prescribed medications to achieve LDL 70mg , HDL >40mg .    Expected Outcomes Short Term: Participant states understanding of desired cholesterol values and is compliant with medications prescribed. Participant is following exercise prescription and nutrition guidelines.;Long Term: Cholesterol controlled with medications as prescribed, with individualized exercise RX and with personalized nutrition plan. Value goals: LDL < 70mg , HDL > 40 mg.    Stress Yes    Intervention Offer individual and/or small group education and counseling on adjustment to heart disease, stress management and health-related lifestyle change. Teach and support self-help strategies.;Refer participants experiencing significant psychosocial distress to appropriate mental health specialists for further evaluation and treatment. When possible, include family members  and significant others in education/counseling sessions.    Expected Outcomes Short Term: Participant demonstrates changes in health-related behavior, relaxation and other stress management skills, ability to obtain effective social support, and compliance with psychotropic medications if prescribed.;Long Term: Emotional wellbeing is indicated by absence of clinically significant psychosocial distress or social isolation.          Core Components/Risk Factors/Patient Goals Review:   Goals and Risk Factor Review     Row Name 09/19/23 1529 10/24/23 1532           Core Components/Risk Factors/Patient Goals Review   Personal Goals Review Weight Management/Obesity;Lipids;Hypertension;Stress;Tobacco Cessation Weight Management/Obesity;Lipids;Hypertension;Stress;Tobacco Cessation      Review Manuel Pearson has been doing well with exercise at cardiac rehab. vital signs have been stable. Manuel Pearson has lost 2.3 kg since starting cardiac rehab. [name]  do well with exercise at cardiac rehab. vital signs have been stable. Manuel Pearson has lost 5.6  kg since starting cardiac rehab. Manuel Pearson has increased his workloads. Will continue to monitor heart rate as beta blocker was chnaged.      Expected Outcomes Manuel Pearson will continue to participate in exercise at cardiac rehab for exercise, nutrition and lifestyle modifications. Manuel Pearson will continue to participate in exercise at cardiac rehab for exercise, nutrition and lifestyle modifications.         Core Components/Risk Factors/Patient Goals at Discharge (Final Review):   Goals and Risk Factor Review - 10/24/23 1532       Core Components/Risk Factors/Patient Goals Review   Personal Goals Review Weight Management/Obesity;Lipids;Hypertension;Stress;Tobacco Cessation    Review [name]  do well with exercise at cardiac rehab. vital signs have been stable. Manuel Pearson has lost 5.6  kg since starting cardiac rehab. Manuel Pearson has increased his workloads. Will continue to monitor heart rate as beta  blocker was chnaged.    Expected Outcomes Manuel Pearson will continue to participate in exercise at cardiac rehab for exercise, nutrition and lifestyle modifications.          ITP Comments:  ITP Comments     Row Name 08/29/23 1030 09/04/23 1136 09/13/23 1705 09/19/23 1526 10/24/23 1520   ITP Comments Medical Director- Dr. Wilbert Bihari, MD. Introduction to the Pritikin Education / Intensive Cardiac Rehab Program. Manuel Pearson tolerated exercise well without symptoms. Acclimated well to the exercise routine. 30 Day ITP Review. Manuel Pearson started cardiac rehab on 09/04/23 and is off to a good start to exercise. 30 Day ITP Review. Manuel Pearson has good attendance and participation with exercise at cardiac rehab 30 Day ITP Review. Manuel Pearson continues to have  good attendance and  participation with exercise at cardiac rehab.      Comments: See ITP Comments

## 2023-10-24 NOTE — Progress Notes (Signed)
 Manuel Pearson exceeded his target heart rate on the recumbent bike. [name] said that he took an oxycodone  the morning prior to coming to exercise. Manuel Pearson had some weeping abrasion's he obtained while working outside working in the yard. Manuel Pearson did not complete exercise today. Manuel Pearson plans to return to exercise on Wednesday.[name] RN BSN

## 2023-10-25 ENCOUNTER — Encounter (HOSPITAL_COMMUNITY)
Admission: RE | Admit: 2023-10-25 | Discharge: 2023-10-25 | Disposition: A | Discharge status: Home / Self Care | Source: Ambulatory Visit | Attending: Cardiology | Admitting: Cardiology

## 2023-10-25 DIAGNOSIS — Z955 Presence of coronary angioplasty implant and graft: Secondary | ICD-10-CM

## 2023-10-25 DIAGNOSIS — Z48812 Encounter for surgical aftercare following surgery on the circulatory system: Secondary | ICD-10-CM | POA: Diagnosis not present

## 2023-10-25 DIAGNOSIS — I2111 ST elevation (STEMI) myocardial infarction involving right coronary artery: Secondary | ICD-10-CM

## 2023-10-27 ENCOUNTER — Encounter (HOSPITAL_COMMUNITY)
Admission: RE | Admit: 2023-10-27 | Discharge: 2023-10-27 | Disposition: A | Discharge status: Home / Self Care | Source: Ambulatory Visit | Attending: Cardiology | Admitting: Cardiology

## 2023-10-27 DIAGNOSIS — I2111 ST elevation (STEMI) myocardial infarction involving right coronary artery: Secondary | ICD-10-CM

## 2023-10-27 DIAGNOSIS — Z955 Presence of coronary angioplasty implant and graft: Secondary | ICD-10-CM

## 2023-10-27 DIAGNOSIS — Z48812 Encounter for surgical aftercare following surgery on the circulatory system: Secondary | ICD-10-CM | POA: Diagnosis not present

## 2023-10-30 ENCOUNTER — Encounter (HOSPITAL_COMMUNITY)
Admission: RE | Admit: 2023-10-30 | Discharge: 2023-10-30 | Disposition: A | Discharge status: Home / Self Care | Source: Ambulatory Visit | Attending: Cardiology | Admitting: Cardiology

## 2023-10-30 DIAGNOSIS — Z955 Presence of coronary angioplasty implant and graft: Secondary | ICD-10-CM

## 2023-10-30 DIAGNOSIS — I2111 ST elevation (STEMI) myocardial infarction involving right coronary artery: Secondary | ICD-10-CM

## 2023-10-30 DIAGNOSIS — Z48812 Encounter for surgical aftercare following surgery on the circulatory system: Secondary | ICD-10-CM | POA: Diagnosis not present

## 2023-11-01 ENCOUNTER — Encounter (HOSPITAL_COMMUNITY)
Admission: RE | Admit: 2023-11-01 | Discharge: 2023-11-01 | Disposition: A | Discharge status: Home / Self Care | Source: Ambulatory Visit | Attending: Cardiology | Admitting: Cardiology

## 2023-11-01 DIAGNOSIS — Z48812 Encounter for surgical aftercare following surgery on the circulatory system: Secondary | ICD-10-CM | POA: Diagnosis not present

## 2023-11-01 DIAGNOSIS — Z955 Presence of coronary angioplasty implant and graft: Secondary | ICD-10-CM

## 2023-11-01 DIAGNOSIS — I2111 ST elevation (STEMI) myocardial infarction involving right coronary artery: Secondary | ICD-10-CM

## 2023-11-03 ENCOUNTER — Encounter (HOSPITAL_COMMUNITY)
Admission: RE | Admit: 2023-11-03 | Discharge: 2023-11-03 | Disposition: A | Discharge status: Home / Self Care | Source: Ambulatory Visit | Attending: Cardiology | Admitting: Cardiology

## 2023-11-03 DIAGNOSIS — I2111 ST elevation (STEMI) myocardial infarction involving right coronary artery: Secondary | ICD-10-CM

## 2023-11-03 DIAGNOSIS — Z48812 Encounter for surgical aftercare following surgery on the circulatory system: Secondary | ICD-10-CM | POA: Diagnosis not present

## 2023-11-03 DIAGNOSIS — Z955 Presence of coronary angioplasty implant and graft: Secondary | ICD-10-CM

## 2023-11-06 ENCOUNTER — Encounter (HOSPITAL_COMMUNITY)
Admission: RE | Admit: 2023-11-06 | Discharge: 2023-11-06 | Disposition: A | Discharge status: Home / Self Care | Source: Ambulatory Visit | Attending: Cardiology | Admitting: Cardiology

## 2023-11-06 DIAGNOSIS — Z955 Presence of coronary angioplasty implant and graft: Secondary | ICD-10-CM

## 2023-11-06 DIAGNOSIS — I2111 ST elevation (STEMI) myocardial infarction involving right coronary artery: Secondary | ICD-10-CM

## 2023-11-06 DIAGNOSIS — Z48812 Encounter for surgical aftercare following surgery on the circulatory system: Secondary | ICD-10-CM | POA: Diagnosis not present

## 2023-11-08 ENCOUNTER — Ambulatory Visit: Attending: Emergency Medicine | Admitting: Emergency Medicine

## 2023-11-08 ENCOUNTER — Telehealth: Payer: Self-pay | Admitting: Emergency Medicine

## 2023-11-08 ENCOUNTER — Encounter (HOSPITAL_COMMUNITY)
Admission: RE | Admit: 2023-11-08 | Discharge: 2023-11-08 | Disposition: A | Discharge status: Home / Self Care | Source: Ambulatory Visit | Attending: Cardiology | Admitting: Cardiology

## 2023-11-08 ENCOUNTER — Encounter: Payer: Self-pay | Admitting: Emergency Medicine

## 2023-11-08 VITALS — BP 98/56 | HR 55 | Ht 67.0 in | Wt 222.0 lb

## 2023-11-08 DIAGNOSIS — E785 Hyperlipidemia, unspecified: Secondary | ICD-10-CM

## 2023-11-08 DIAGNOSIS — Z720 Tobacco use: Secondary | ICD-10-CM | POA: Diagnosis not present

## 2023-11-08 DIAGNOSIS — Z955 Presence of coronary angioplasty implant and graft: Secondary | ICD-10-CM

## 2023-11-08 DIAGNOSIS — I2510 Atherosclerotic heart disease of native coronary artery without angina pectoris: Secondary | ICD-10-CM | POA: Diagnosis not present

## 2023-11-08 DIAGNOSIS — I2111 ST elevation (STEMI) myocardial infarction involving right coronary artery: Secondary | ICD-10-CM

## 2023-11-08 DIAGNOSIS — I10 Essential (primary) hypertension: Secondary | ICD-10-CM

## 2023-11-08 DIAGNOSIS — Z48812 Encounter for surgical aftercare following surgery on the circulatory system: Secondary | ICD-10-CM | POA: Diagnosis not present

## 2023-11-08 MED ORDER — LISINOPRIL 20 MG PO TABS: 20.0000 mg | ORAL_TABLET | Freq: Every day | ORAL | 1 refills | Status: AC

## 2023-11-08 MED ORDER — PROPRANOLOL HCL ER BEADS 120 MG PO CP24: 120.0000 mg | ORAL_CAPSULE | Freq: Two times a day (BID) | ORAL | 2 refills | Status: AC

## 2023-11-08 NOTE — Progress Notes (Signed)
 Cardiology Office Note:    Date:  11/08/2023  ID:  AKIEL FENNELL, DOB August 13, 1965, MRN [id number] PCP: [facility]  [facility] Providers Cardiologist:  Gordy Bergamo, MD Cardiology APP:  Rana Lum CROME, NP       Patient Profile:       Chief Complaint: 72-month follow-up History of Present Illness:  Manuel Pearson is a 58 y.o. 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  EMS was activated he was found to have EKG abnormality suggestive of an inferior lateral STEMI.  He was brought to the ER and subsequently to the Cath Lab for emergent catheterization.  He underwent PCI/DES of proximal RCA with thrombectomy.  Started on DAPT with plan to continue for 1 year.  His home propranolol  was transition to metoprolol .  He was started on atorvastatin .  Echocardiogram showed LVEF 55-60%, no RWMA, no significant abnormalities.  He was last seen in office on 07/20/2023.  He was without acute complaints.  He denied prior anginal equivalent and was without any anginal symptoms.  Current medication management was continued and he was to follow-up in 3 months.  On 10/14/2023 his metoprolol  was discontinued in place of his previous propranolol  120 mg twice daily given his history of anxiety and PTSD.   Discussed the use of AI scribe software for clinical note transcription with the patient, who gave verbal consent to proceed.  History of Present Illness Manuel Pearson is a 58 year old male with coronary artery disease who presents for follow-up after cardiac catheterization and stent placement.  Today patient is without any acute cardiovascular concerns or complaints.  He attends cardiac rehabilitation three times a week and has lost 20 pounds, currently weighing 222 pounds. His LDL  cholesterol has improved from 101 to 30. He takes propranolol  120 mg twice daily, primarily for anxiety management, and has increased smoking to half a pack a day due to anxiety. He experiences easy bleeding from minor cuts.  His diet is high in fresh fruits and vegetables. He is on propranolol , atorvastatin , Zetia, and lisinopril , taking lisinopril  twice daily but is unsure of the dosage.  He tells me lisinopril  is prescribed by the [place] which we did not have on his medication list.  He takes salt tablets for sodium management.  He is without any chest pains, dyspnea, vomiting, PND, dizziness, hematochezia, melena, orthopnea, PND,   Review of systems:  Please see the history of present illness. All other systems are reviewed and otherwise negative.      Studies Reviewed:        Echocardiogram 07/03/2023 1. Left ventricular ejection fraction, by estimation, is 55 to 60%. The  left ventricle has normal function. The left ventricle has no regional  wall motion abnormalities. Left ventricular diastolic parameters are  indeterminate.   2. Right ventricular systolic function is normal. The right ventricular  size is normal. Tricuspid regurgitation signal is inadequate for assessing  PA pressure.   3. The mitral valve is normal in structure. Mild mitral valve  regurgitation.   4. The aortic valve has an indeterminant number of cusps. Aortic valve  regurgitation is not visualized.   5. The inferior vena cava is normal in size with greater than 50%  respiratory variability, suggesting right atrial pressure of 3 mmHg.  Cardiac catheterization 07/02/2023 Intervention data: Successful thrombectomy with Pronto aspiration catheter followed by stenting with a 4.0 x 32 mm Synergy XD DES at 14 atmospheric pressure, stenosis reduced from 80% to 0% with TIMI-3 to TIMI-3 flow.  Normal there is no evidence of edge dissection. Diagnostic Dominance: Right  Intervention     Risk Assessment/Calculations:               Physical Exam:   VS:  BP (!) 98/56   Pulse (!) 55   Ht 5' 7 (1.702 m)   Wt 222 lb (100.7 kg)   SpO2 97%   BMI 34.77 kg/m    Wt Readings from Last 3 Encounters:  11/08/23 222 lb (100.7 kg)  08/29/23 238 lb 12.1 oz (108.3 kg)  07/20/23 237 lb 3.2 oz (107.6 kg)    GEN: Well nourished, well developed in no acute distress NECK: No JVD; No carotid bruits CARDIAC: RRR, no murmurs, rubs, gallops RESPIRATORY:  Clear to auscultation without rales, wheezing or rhonchi  ABDOMEN: Soft, non-tender, non-distended EXTREMITIES:  No edema; No acute deformity      Assessment and Plan:  Coronary artery disease / Inferolateral STEMI S/p PCI of proximal RCA with thrombectomy and stenting DES on 06/2023 Echocardiogram shows LVEF 55-60%, no RWMA, no significant abnormalities - Today patient is without any anginal symptoms. Attending cardiac rehab without any exertional symptoms.  No indication for further ischemic evaluation at this time - Continue DAPT for 12 months with aspirin  81 mg daily and prasugrel  10 mg daily - Continue atorvastatin  80 mg daily, ezetimibe 10 mg daily, propranolol  120 mg twice daily - BMET today   Tobacco abuse Smokes half a pack a day - Has tried nicotine  products, Wellbutrin, Chantix in the past without success - Complete cessation encouraged   Hyperlipidemia, LDL goal <55 Lipoprotein (A) 18.2 on 06/2023 LDL 30 on 09/2023 under excellent control - Continue atorvastatin  80 mg daily and ezetimibe 10 mg   Hypertension Blood pressure today is 98/56 and well-controlled On prazosin  2 mg daily for PTSD - Continue lisinopril  20 mg daily and propranolol  120 mg twice daily    Chronic hyponatremia Sodium 129 on 09/2023 - On salt tablets chronically      Dispo:  Return in about 6 months (around 05/10/2024).  Signed, Lum LITTIE [name], NP

## 2023-11-08 NOTE — Patient Instructions (Signed)
 Medication Instructions:  CONTINUE CURRENT MEDICATION THERAPY.   Lab Work: BMET TO BE DONE AT ANYTIME   Testing/Procedures: NONE  Follow-Up: At [facility], you and your health needs are our priority.  As part of our continuing mission to provide you with exceptional heart care, our providers are all part of one team.  This team includes your primary Cardiologist (physician) and Advanced Practice Providers or APPs (Physician Assistants and Nurse Practitioners) who all work together to provide you with the care you need, when you need it.  Your next appointment:   6 MONTHS  Provider:   Gordy Bergamo, MD OR Lum [name], NP

## 2023-11-08 NOTE — Telephone Encounter (Signed)
  Patient was asked to call back with correct medication information  Propranolol  120 mg 2 x daily  Lisinopril  20 mg 1 x daily

## 2023-11-08 NOTE — Telephone Encounter (Signed)
 Spoke to patient and verified the dosage of his Lisinopril . Per Lum [name] he is to take Lisinopril  20 mg daily. He understood instructions and had no other questions.

## 2023-11-10 ENCOUNTER — Encounter (HOSPITAL_COMMUNITY)
Admission: RE | Admit: 2023-11-10 | Discharge: 2023-11-10 | Disposition: A | Discharge status: Home / Self Care | Source: Ambulatory Visit | Attending: Cardiology | Admitting: Cardiology

## 2023-11-10 DIAGNOSIS — Z955 Presence of coronary angioplasty implant and graft: Secondary | ICD-10-CM

## 2023-11-10 DIAGNOSIS — I2111 ST elevation (STEMI) myocardial infarction involving right coronary artery: Secondary | ICD-10-CM

## 2023-11-10 DIAGNOSIS — Z48812 Encounter for surgical aftercare following surgery on the circulatory system: Secondary | ICD-10-CM | POA: Diagnosis not present

## 2023-11-15 ENCOUNTER — Encounter (HOSPITAL_COMMUNITY)
Admission: RE | Admit: 2023-11-15 | Discharge: 2023-11-15 | Disposition: A | Discharge status: Home / Self Care | Source: Ambulatory Visit | Attending: Cardiology | Admitting: Cardiology

## 2023-11-15 DIAGNOSIS — Z955 Presence of coronary angioplasty implant and graft: Secondary | ICD-10-CM | POA: Diagnosis not present

## 2023-11-15 DIAGNOSIS — I2111 ST elevation (STEMI) myocardial infarction involving right coronary artery: Secondary | ICD-10-CM | POA: Diagnosis present

## 2023-11-17 ENCOUNTER — Encounter (HOSPITAL_COMMUNITY)
Admission: RE | Admit: 2023-11-17 | Discharge: 2023-11-17 | Disposition: A | Discharge status: Home / Self Care | Source: Ambulatory Visit | Attending: Cardiology | Admitting: Cardiology

## 2023-11-17 DIAGNOSIS — I2111 ST elevation (STEMI) myocardial infarction involving right coronary artery: Secondary | ICD-10-CM | POA: Diagnosis not present

## 2023-11-17 DIAGNOSIS — Z955 Presence of coronary angioplasty implant and graft: Secondary | ICD-10-CM

## 2023-11-17 LAB — BASIC METABOLIC PANEL WITH GFR
BUN/Creatinine Ratio: 16 (ref 9–20)
BUN: 17 mg/dL (ref 6–24)
CO2: 21 mmol/L (ref 20–29)
Calcium: 9.6 mg/dL (ref 8.7–10.2)
Chloride: 102 mmol/L (ref 96–106)
Creatinine, Ser: 1.07 mg/dL (ref 0.76–1.27)
Glucose: 99 mg/dL (ref 70–99)
Potassium: 4.3 mmol/L (ref 3.5–5.2)
Sodium: 140 mmol/L (ref 134–144)
eGFR: 80 mL/min/1.73 (ref >59)

## 2023-11-19 ENCOUNTER — Ambulatory Visit: Payer: Self-pay | Admitting: Emergency Medicine

## 2023-11-20 ENCOUNTER — Encounter (HOSPITAL_COMMUNITY)
Admission: RE | Admit: 2023-11-20 | Discharge: 2023-11-20 | Disposition: A | Discharge status: Home / Self Care | Source: Ambulatory Visit | Attending: Cardiology

## 2023-11-20 DIAGNOSIS — Z955 Presence of coronary angioplasty implant and graft: Secondary | ICD-10-CM

## 2023-11-20 DIAGNOSIS — I2111 ST elevation (STEMI) myocardial infarction involving right coronary artery: Secondary | ICD-10-CM | POA: Diagnosis not present

## 2023-11-20 NOTE — Progress Notes (Signed)
 Cardiac Individual Treatment Plan  Patient Details  Name: Manuel Pearson MRN: [id number] Date of Birth: Jun 05, 1965 Referring Provider:   Flowsheet Row INTENSIVE CARDIAC REHAB ORIENT from 08/29/2023 in [facility], [place] [facility]  Referring Provider Ladona Heinz, MD    Initial Encounter Date:  Flowsheet Row INTENSIVE CARDIAC REHAB ORIENT from 08/29/2023 in [facility]  Date 08/29/23    Visit Diagnosis: 07/03/23 STEMI  07/03/23 DES pRCA  Patient's Home Medications on Admission:  Current Outpatient Medications:    aspirin  81 MG chewable tablet, Chew 81 mg by mouth daily., Disp: , Rfl:    atorvastatin  (LIPITOR) 80 MG tablet, Take 1 tablet (80 mg total) by mouth daily., Disp: 30 tablet, Rfl: 5   cetirizine (ZYRTEC) 10 MG tablet, Take 10 mg by mouth daily., Disp: , Rfl:    cyclobenzaprine  (FLEXERIL ) 10 MG tablet, Take 10 mg by mouth at bedtime., Disp: , Rfl:    Erenumab-aooe (AIMOVIG) 70 MG/ML SOAJ, Inject into the skin every 30 (thirty) days., Disp: , Rfl:    ezetimibe (ZETIA) 10 MG tablet, Take 10 mg by mouth daily., Disp: , Rfl:    levETIRAcetam  (KEPPRA ) 750 MG tablet, Take 750 mg by mouth 4 (four) times daily., Disp: , Rfl:    levothyroxine  (SYNTHROID , LEVOTHROID) 50 MCG tablet, Take 50 mcg by mouth daily before breakfast., Disp: , Rfl:    lisinopril  (ZESTRIL ) 20 MG tablet, Take 1 tablet (20 mg total) by mouth daily., Disp: 90 tablet, Rfl: 1   Misc Natural Products (PROSTATE COMPLETE) CAPS, Take 3 tablets by mouth daily., Disp: , Rfl:    Multiple Vitamin (MULITIVITAMIN WITH MINERALS) TABS, Take 1 tablet by mouth daily. , Disp: , Rfl:    nitroGLYCERIN  (NITROSTAT ) 0.4 MG SL tablet, Place 1 tablet (0.4 mg total) under the tongue every 5 (five) minutes as needed for chest pain (up to 3 doses. If taking 3rd dose call 911)., Disp: 25 tablet, Rfl: 3   omeprazole (PRILOSEC) 20 MG capsule, Take 20 mg  by mouth daily., Disp: , Rfl:    OXcarbazepine  (TRILEPTAL ) 150 MG tablet, Take 225-450 mg by mouth See admin instructions. PATIENT TAKES 225 MG by mouth  TID AND 450 MG by mouth  AT BEDTIME PER PATIENT, Disp: , Rfl:    prasugrel  (EFFIENT ) 10 MG TABS tablet, Take 1 tablet (10 mg total) by mouth daily., Disp: 30 tablet, Rfl: 11   prazosin  (MINIPRESS ) 2 MG capsule, Take 2 mg by mouth at bedtime. , Disp: , Rfl:    propranolol  (INNOPRAN  XL) 120 MG 24 hr capsule, Take 1 capsule (120 mg total) by mouth 2 (two) times daily., Disp: 180 capsule, Rfl: 2   sodium chloride  1 g tablet, Take 1 g by mouth 2 (two) times daily with a meal., Disp: , Rfl:    traZODone  (DESYREL ) 150 MG tablet, Take 75 mg by mouth at bedtime., Disp: , Rfl:   Past Medical History: Past Medical History:  Diagnosis Date   Anxiety    Elevated liver enzymes    has had increases due to headache meds, but back to normal    GERD (gastroesophageal reflux disease)    Headache(784.0)    MIGRAINES    Hypertension    Hypothyroidism    Pneumonia 2022   PONV (postoperative nausea and vomiting)    DIFFICULTY WAKING UP    PTSD (post-traumatic stress disorder)    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   Sleep apnea 2019   uses CPAP nightly    Tobacco Use: Social History   Tobacco Use  Smoking Status Every Day   Current packs/day: 0.50   Average packs/day: 0.5 packs/day for 40.0 years (20.0 ttl pk-yrs)   Types: Cigarettes  Smokeless Tobacco Never    Labs: Review Flowsheet       Latest Ref Rng & Units 07/02/2023 09/21/2023 10/05/2023  Labs for ITP Cardiac and Pulmonary Rehab  Cholestrol 100 - 199 mg/dL 843  65  74   LDL (calc) 0 - 99 mg/dL 898  Comment  30   HDL-C >39 mg/dL 34  54  29   Trlycerides 0 - 149 mg/dL 896  62  65   TCO2 22 - 32 mmol/L 25  - -    Capillary Blood Glucose: Lab Results  Component Value Date   GLUCAP 104 (H) 07/02/2023     Exercise Target Goals: Exercise Program  Goal: Individual exercise prescription set using results from initial 6 min walk test and THRR while considering  patient's activity barriers and safety.   Exercise Prescription Goal: Initial exercise prescription builds to 30-45 minutes a day of aerobic activity, 2-3 days per week.  Home exercise guidelines will be given to patient during program as part of exercise prescription that the participant will acknowledge.  Activity Barriers & Risk Stratification:  Activity Barriers & Cardiac Risk Stratification - 08/29/23 1114       Activity Barriers & Cardiac Risk Stratification   Activity Barriers Arthritis;Back Problems;Joint Problems;Other (comment)    Comments Multiple back surgeries, right ankle fx and surgery x2, spinal stenosis, hx seizures, shoulder surgery.    Cardiac Risk Stratification High          6 Minute Walk:  6 Minute Walk     Row Name 08/29/23 1212         6 Minute Walk   Phase Initial     Distance 1710 feet     Walk Time 6 minutes     # of Rest Breaks 0     MPH 3.24     METS 3.73     RPE 15     Perceived Dyspnea  1     VO2 Peak 13.05     Symptoms Yes (comment)     Comments Mild shortness of breath. Bilateral calf burning.     Resting HR 70 bpm     Exercise Oxygen Saturation  during 6 min walk 96 %     Max Ex. HR 82 bpm     Max Ex. BP 166/84     2 Minute Post BP 138/72        Oxygen Initial Assessment:   Oxygen Re-Evaluation:   Oxygen Discharge (Final Oxygen Re-Evaluation):   Initial Exercise Prescription:  Initial Exercise Prescription - 08/29/23 1400       Date of Initial Exercise RX and Referring Provider   Date 08/29/23    Referring Provider Ladona Heinz, MD    Expected Discharge Date 11/22/23      Recumbant Bike   Level 1    RPM 50    Watts 25    Minutes 15    METs 2.3      NuStep   Level 1    SPM 85    Minutes 15    METs 2.3      Prescription Details   Frequency (times per week) 3  Duration Progress to 30 minutes of  continuous aerobic without signs/symptoms of physical distress      Intensity   THRR 40-80% of Max Heartrate 64-129    Ratings of Perceived Exertion 11-13    Perceived Dyspnea 0-4      Progression   Progression Continue to progress workloads to maintain intensity without signs/symptoms of physical distress.      Resistance Training   Training Prescription Yes    Weight 3 lbs    Reps 10-15          Perform Capillary Blood Glucose checks as needed.  Exercise Prescription Changes:   Exercise Prescription Changes     Row Name 09/04/23 1028 09/13/23 1033 09/25/23 1034 10/16/23 1038 10/30/23 1030     Response to Exercise   Blood Pressure (Admit) 112/62 106/68 116/66 106/60 98/60   Blood Pressure (Exercise) 140/80 140/80 160/80 -- 160/80   Blood Pressure (Exit) 98/60 108/70 102/66 101/66 100/60   Heart Rate (Admit) 63 bpm 67 bpm 55 bpm 54 bpm 56 bpm   Heart Rate (Exercise) 96 bpm 104 bpm 102 bpm 118 bpm 95 bpm   Heart Rate (Exit) 73 bpm 76 bpm 68 bpm 80 bpm 65 bpm   Rating of Perceived Exertion (Exercise) 11 12 11 11 13    Symptoms None None None None None   Comments Off to a great start with exercise. -- -- -- --   Duration Continue with 30 min of aerobic exercise without signs/symptoms of physical distress. Continue with 30 min of aerobic exercise without signs/symptoms of physical distress. Continue with 30 min of aerobic exercise without signs/symptoms of physical distress. Continue with 30 min of aerobic exercise without signs/symptoms of physical distress. Continue with 30 min of aerobic exercise without signs/symptoms of physical distress.   Intensity THRR unchanged THRR unchanged THRR unchanged THRR unchanged THRR unchanged     Progression   Progression Continue to progress workloads to maintain intensity without signs/symptoms of physical distress. Continue to progress workloads to maintain intensity without signs/symptoms of physical distress. Continue to progress workloads  to maintain intensity without signs/symptoms of physical distress. Continue to progress workloads to maintain intensity without signs/symptoms of physical distress. Continue to progress workloads to maintain intensity without signs/symptoms of physical distress.   Average METs 4.4 3.7 4 5  5.2     Resistance Training   Training Prescription Yes No Yes Yes Yes   Weight 5 lbs Relaxation day, no weights. 5 lbs 5 lbs 6 lbs   Reps 10-15 -- 10-15 10-15 10-15   Time 10 Minutes -- 5 Minutes 5 Minutes 5 Minutes     Interval Training   Interval Training No No No No No     Bike   Level 4.5 4.5 4.5 5 6    Watts 72 72 91 110 117   Minutes 15 15 15 15 15    METs 6.5 4.1 4.5 5.3 5.8     Recumbant Bike   Level 1 -- -- -- --   RPM 83 -- -- -- --   Watts 18 -- -- -- --   Minutes 15 -- -- -- --   METs 2.4 -- -- -- --     NuStep   Level -- 3 4 6 7    SPM -- 80 135 126 124   Minutes -- 15 15 15 15    METs -- 3.4 3.6 4.7 4.7     Home Exercise Plan   Plans to continue exercise at -- -- Home (comment)  Home (comment) Home (comment)   Frequency -- -- Add 2 additional days to program exercise sessions. Add 2 additional days to program exercise sessions. Add 2 additional days to program exercise sessions.   Initial Home Exercises Provided -- -- 09/22/23 09/22/23 09/22/23    Row Name 11/15/23 1035             Response to Exercise   Blood Pressure (Admit) 104/60       Blood Pressure (Exit) 108/60       Heart Rate (Admit) 68 bpm       Heart Rate (Exercise) 106 bpm       Heart Rate (Exit) 74 bpm       Rating of Perceived Exertion (Exercise) 14       Symptoms None       Comments Increased workload on NuStep today.       Duration Continue with 30 min of aerobic exercise without signs/symptoms of physical distress.       Intensity THRR unchanged         Progression   Progression Continue to progress workloads to maintain intensity without signs/symptoms of physical distress.       Average METs 5          Resistance Training   Training Prescription No       Weight Relaxation day, no weights.         Interval Training   Interval Training No         Bike   Level 7       Watts 116       Minutes 15       METs 5.4         NuStep   Level 8       SPM 108       Minutes 15       METs 4.6         Home Exercise Plan   Plans to continue exercise at Home (comment)       Frequency Add 2 additional days to program exercise sessions.       Initial Home Exercises Provided 09/22/23          Exercise Comments:   Exercise Comments     Row Name 09/04/23 1136 09/18/23 1102 09/22/23 1103 10/18/23 1057 11/01/23 1046   Exercise Comments Manuel Pearson tolerated mild to moderate intensity well without symptoms. Oriented to the exercise equipment and stretching routine. Increased workload on bike and was able to use the 5 lb hand weights without issue. Reviewed METs with Manuel Pearson. Reviewed home exercise guidelines, METs, and goals with Manuel Pearson. Reviewed METs and goals with Manuel Pearson. Reviewed goals with Manuel Pearson.      Exercise Goals and Review:   Exercise Goals     Row Name 08/29/23 1114             Exercise Goals   Increase Physical Activity Yes       Intervention Provide advice, education, support and counseling about physical activity/exercise needs.;Develop an individualized exercise prescription for aerobic and resistive training based on initial evaluation findings, risk stratification, comorbidities and participant's personal goals.       Expected Outcomes Short Term: Attend rehab on a regular basis to increase amount of physical activity.;Long Term: Exercising regularly at least 3-5 days a week.;Long Term: Add in home exercise to make exercise part of routine and to increase amount of physical activity.       Increase Strength and Stamina Yes  Intervention Provide advice, education, support and counseling about physical activity/exercise needs.;Develop an individualized exercise prescription for  aerobic and resistive training based on initial evaluation findings, risk stratification, comorbidities and participant's personal goals.       Expected Outcomes Short Term: Increase workloads from initial exercise prescription for resistance, speed, and METs.;Short Term: Perform resistance training exercises routinely during rehab and add in resistance training at home;Long Term: Improve cardiorespiratory fitness, muscular endurance and strength as measured by increased METs and functional capacity ( )       Able to understand and use rate of perceived exertion (RPE) scale Yes       Intervention Provide education and explanation on how to use RPE scale       Expected Outcomes Short Term: Able to use RPE daily in rehab to express subjective intensity level;Long Term:  Able to use RPE to guide intensity level when exercising independently       Knowledge and understanding of Target Heart Rate Range (THRR) Yes       Intervention Provide education and explanation of THRR including how the numbers were predicted and where they are located for reference       Expected Outcomes Short Term: Able to state/look up THRR;Long Term: Able to use THRR to govern intensity when exercising independently;Short Term: Able to use daily as guideline for intensity in rehab       Able to check pulse independently Yes       Intervention Provide education and demonstration on how to check pulse in carotid and radial arteries.;Review the importance of being able to check your own pulse for safety during independent exercise       Expected Outcomes Short Term: Able to explain why pulse checking is important during independent exercise;Long Term: Able to check pulse independently and accurately       Understanding of Exercise Prescription Yes       Intervention Provide education, explanation, and written materials on patient's individual exercise prescription       Expected Outcomes Short Term: Able to explain program exercise  prescription;Long Term: Able to explain home exercise prescription to exercise independently          Exercise Goals Re-Evaluation :  Exercise Goals Re-Evaluation     Row Name 09/04/23 1136 09/22/23 1103 10/18/23 1057 11/01/23 1046       Exercise Goal Re-Evaluation   Exercise Goals Review Increase Physical Activity;Increase Strength and Stamina;Able to understand and use rate of perceived exertion (RPE) scale Increase Physical Activity;Increase Strength and Stamina;Able to understand and use rate of perceived exertion (RPE) scale;Knowledge and understanding of Target Heart Rate Range (THRR);Able to check pulse independently;Understanding of Exercise Prescription Increase Physical Activity;Increase Strength and Stamina;Able to understand and use rate of perceived exertion (RPE) scale;Knowledge and understanding of Target Heart Rate Range (THRR);Able to check pulse independently;Understanding of Exercise Prescription Increase Physical Activity;Increase Strength and Stamina;Able to understand and use rate of perceived exertion (RPE) scale;Knowledge and understanding of Target Heart Rate Range (THRR);Able to check pulse independently;Understanding of Exercise Prescription    Comments Manuel Pearson was able to understand and use RPE scale appropriately. Reviewed exercise prescription with Manuel Pearson. He is not currently walking for exercise because he has plates in his lower leg/ankle which cause soreness/ pain/ numbness. Discussed seated stretches as an alternative to weight bearing exercise that bothers his leg. Handout given on seated aerobic exercise that can be done at home. Manuel Pearson continues to make great progess with exercise. He's currently achieving 5.0 METs  with exercise. He will increase hand weights from 5  to 6 lbs next session. Manuel Pearson is progressing workloads appropriately. He wants to buy a bike to ride at home, so he's trying to build his stamina and get comfortable riding by increasing his workloads on the  bike.    Expected Outcomes Progress workloads as tolerated to help increase strength and endurance. Continue to progress workloads. Introduce seated aerobic exercise at home 1-2 days/week at home. Continue to progess workloads to increase strength and stamina. Manuel Pearson will buy a bike and add outdoor biking to his home exercise routine.       Discharge Exercise Prescription (Final Exercise Prescription Changes):  Exercise Prescription Changes - 11/15/23 1035       Response to Exercise   Blood Pressure (Admit) 104/60    Blood Pressure (Exit) 108/60    Heart Rate (Admit) 68 bpm    Heart Rate (Exercise) 106 bpm    Heart Rate (Exit) 74 bpm    Rating of Perceived Exertion (Exercise) 14    Symptoms None    Comments Increased workload on NuStep today.    Duration Continue with 30 min of aerobic exercise without signs/symptoms of physical distress.    Intensity THRR unchanged      Progression   Progression Continue to progress workloads to maintain intensity without signs/symptoms of physical distress.    Average METs 5      Resistance Training   Training Prescription No    Weight Relaxation day, no weights.      Interval Training   Interval Training No      Bike   Level 7    Watts 116    Minutes 15    METs 5.4      NuStep   Level 8    SPM 108    Minutes 15    METs 4.6      Home Exercise Plan   Plans to continue exercise at Home (comment)    Frequency Add 2 additional days to program exercise sessions.    Initial Home Exercises Provided 09/22/23          Nutrition:  Target Goals: Understanding of nutrition guidelines, daily intake of sodium 1500mg , cholesterol 200mg , calories 30% from fat and 7% or less from saturated fats, daily to have 5 or more servings of fruits and vegetables.  Biometrics:  Pre Biometrics - 08/29/23 1030       Pre Biometrics   Waist Circumference 47 inches    Hip Circumference 45 inches    Waist to Hip Ratio 1.04 %    Triceps Skinfold 20 mm     % Body Fat 35.1 %    Grip Strength 29 kg    Flexibility --   Not performed. Mulitple back surgery, chronic pain.   Single Leg Stand 30 seconds           Nutrition Therapy Plan and Nutrition Goals:  Nutrition Therapy & Goals - 11/06/23 1124       Nutrition Therapy   Diet Heart Healthy Diet    Drug/Food Interactions Statins/Certain Fruits      Personal Nutrition Goals   Nutrition Goal Patient to identify strategies for reducing cardiovascular risk by attending the [employer] education and nutrition series weekly   goal in progress.   Personal Goal #2 Patient to improve diet quality by using the plate method as a guide for meal planning to include lean protein/plant protein, fruits, vegetables, whole grains, nonfat dairy as part of  a well-balanced diet.   goal in progress.   Comments Goals in action. 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 He does take salt tablets due to chronic hyponatremia; patient reports this is caused by his seizure medications. He reports making some dietary changes including reduced processed foods, decreased simple sugars, and increased lean proteins. A1c is in a prediabetic range. LDL is at goal. He is down 16# since starting with our program. He continues to work on smoking cessation. Patient will benefit from participation in intensive cardiac rehab for nutrition, exercise, and lifestyle modification.      Intervention Plan   Intervention Prescribe, educate and counsel regarding individualized specific dietary modifications aiming towards targeted core components such as weight, hypertension, lipid management, diabetes, heart failure and other comorbidities.;Nutrition handout(s) given to patient.    Expected Outcomes Short Term Goal: Understand basic principles of dietary content, such as calories, fat, sodium, cholesterol  and nutrients.;Long Term Goal: Adherence to prescribed nutrition plan.          Nutrition Assessments:  Nutrition Assessments - 09/04/23 1059       Rate Your Plate Scores   Pre Score 50         MEDIFICTS Score Key: >=70 Need to make dietary changes  40-70 Heart Healthy Diet <= 40 Therapeutic Level Cholesterol Diet   Flowsheet Row INTENSIVE CARDIAC REHAB from 09/04/2023 in [facility]  Picture Your Plate Total Score on Admission 50   Picture Your Plate Scores: <59 Unhealthy dietary pattern with much room for improvement. 41-50 Dietary pattern unlikely to meet recommendations for good health and room for improvement. 51-60 More healthful dietary pattern, with some room for improvement.  >60 Healthy dietary pattern, although there may be some specific behaviors that could be improved.    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 He does take salt tablets due to chronic hyponatremia; patient reports this is caused by his seizure medications. He reports making some dietary changes including reduced processed foods and increased lean proteins. A1c is in a prediabetic range. LDL is at goal. Patient will benefit from participation in intensive cardiac rehab for nutrition, exercise, and lifestyle modification. Goals in progress. Manuel Pearson has medical history of chronic back pain, PTSD, hypertension, hypercholesterolemia, migraines, seizures due to  TBI in military, OSA, GERD, hyponatremia, tobacco abuse, STEMI. He does take salt tablets due to chronic hyponatremia; patient reports this is caused by his seizure medications. He reports making some dietary changes including reduced processed foods, decreased simple sugars, and increased lean proteins. A1c is in a prediabetic range. LDL is at goal. He is down 5.1# since starting with our program. He continues to work on smoking cessation. Patient will benefit from participation in intensive cardiac rehab for nutrition, exercise, and  lifestyle modification. Goals in action. 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 He does take salt tablets due to chronic hyponatremia; patient reports this is caused by his seizure medications. He reports making some dietary changes including reduced processed foods, decreased simple sugars, and increased lean proteins. A1c is in a prediabetic range. LDL is at goal. He is down 16# since starting with our program. He continues to work on smoking cessation. Patient will benefit from participation in intensive cardiac rehab for nutrition, exercise, and lifestyle modification.        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 He does take salt tablets due to chronic hyponatremia; patient reports this is caused by his seizure medications. He reports making  some dietary changes including reduced processed foods and increased lean proteins. A1c is in a prediabetic range. LDL is at goal. Patient will benefit from participation in intensive cardiac rehab for nutrition, exercise, and lifestyle modification. Goals in progress. 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 He does take salt tablets due to chronic hyponatremia; patient reports this is caused by his seizure medications. He reports making some dietary changes including reduced processed foods, decreased simple sugars, and increased lean proteins. A1c is in a prediabetic range. LDL is at goal. He is down 5.1# since starting with our program. He continues to work on smoking cessation. Patient will benefit from participation in intensive cardiac rehab for nutrition, exercise, and lifestyle modification. Goals in action. 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 He does take salt tablets due to chronic hyponatremia; patient reports this is caused by his seizure medications. He reports making some dietary changes including reduced processed foods, decreased simple sugars, and increased lean proteins. A1c is in a prediabetic range. LDL is at goal. He is down 16# since starting with our program. He continues to work on smoking cessation. Patient will benefit from participation in intensive cardiac rehab for nutrition, exercise, and lifestyle modification.        Nutrition Goals Discharge (Final Nutrition Goals Re-Evaluation):  Nutrition Goals Re-Evaluation - 11/06/23 1124       Goals   Current Weight 222 lb 10.6 oz (101 kg)    Comment A1c 6.0, HDL 29, LDL 31, Lpa WNL    Expected Outcome Goals in action. 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 He does take salt tablets due to chronic hyponatremia; patient reports this is caused by his seizure medications. He reports making some dietary changes including reduced processed foods, decreased simple sugars, and  increased lean proteins. A1c is in a prediabetic range. LDL is at goal. He is down 16# since starting with our program. He continues to work on smoking cessation. Patient will benefit from participation in intensive cardiac rehab for nutrition, exercise, and lifestyle modification.          Psychosocial: Target Goals: Acknowledge presence or absence of significant depression and/or stress, maximize coping skills, provide positive support system. Participant is able to verbalize types and ability to use techniques and skills needed for reducing stress and depression.  Initial Review & Psychosocial Screening:  Initial Psych Review & Screening - 08/29/23 1110       Initial Review   Current issues with Current Depression;Current Anxiety/Panic;Current Stress Concerns    Source of Stress Concerns Unable to participate in former interests or hobbies;Unable to perform yard/household activities    Comments Unable to do the activities he would like to because of chronic back pain.      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 They're together everyday. Wife also has health concerns.      Barriers   Psychosocial barriers to participate in program The patient should benefit from training in stress management and relaxation.;Psychosocial barriers identified (see note)      Screening Interventions   Interventions Encouraged to exercise;To provide support and resources with identified psychosocial  needs;Provide feedback about the scores to participant    Expected Outcomes Short Term goal: Utilizing psychosocial counselor, staff and physician to assist with identification of specific Stressors or current issues interfering with healing process. Setting desired goal for each stressor or current issue identified.;Long Term Goal: Stressors or current issues are controlled or eliminated.;Short Term goal: Identification and review with participant of any Quality of Life or Depression concerns found by scoring the questionnaire.;Long Term goal: The participant improves quality of Life and PHQ9 Scores as seen by post scores and/or verbalization of changes          Quality of Life Scores:  Quality of Life - 08/29/23 1420       Quality of Life   Select Quality of Life      Quality of Life Scores   Health/Function Pre 18.33 %    Socioeconomic Pre 24.83 %    Psych/Spiritual Pre 17.5 %    Family Pre 27.3 %    GLOBAL Pre 20.7 %         Scores of 19 and below usually indicate a poorer quality of life in these areas.  A difference of  2-3 points is a clinically meaningful difference.  A difference of 2-3 points in the total score of the Quality of Life Index has been associated with significant improvement in overall quality of life, self-image, physical symptoms, and general health in studies assessing change in quality of life.  PHQ-9: Review Flowsheet       08/29/2023 09/21/2014 09/07/2014  Depression screen PHQ 2/9  Decreased Interest 0 0 0  Down, Depressed, Hopeless 0 0 0  PHQ - 2 Score 0 0 0  Altered sleeping 0 - -  Tired, decreased energy 1 - -  Change in appetite 1 - -  Feeling bad or failure about yourself  0 - -  Trouble concentrating 1 - -  Moving slowly or fidgety/restless 0 - -  Suicidal thoughts 0 - -  PHQ-9 Score 3 - -  Difficult doing  work/chores Somewhat difficult - -   Interpretation of Total Score  Total Score Depression Severity:  1-4 = Minimal depression, 5-9 =  Mild depression, 10-14 = Moderate depression, 15-19 = Moderately severe depression, 20-27 = Severe depression   Psychosocial Evaluation and Intervention:   Psychosocial Re-Evaluation:  Psychosocial Re-Evaluation     Row Name 09/13/23 1707 10/24/23 1523 11/20/23 1751         Psychosocial Re-Evaluation   Current issues with Current Depression;Current Anxiety/Panic;Current Stress Concerns Current Anxiety/Panic;Current Stress Concerns Current Anxiety/Panic;Current Stress Concerns     Comments Reviewed quality of life. Manuel Pearson denies being depressed. Manuel Pearson says he has been through a lot just having recent back surgery and now cardiac stenting in April. Manuel Pearson says his energy level has improved some since he has started cardiac rehab Manuel Pearson has not voiced any increased concerns or stressors during exercise at cardiac rehab. Manuel Pearson continues not voice any increased concerns or stressors during exercise at cardiac rehab.     Expected Outcomes Manuel Pearson says he is not currently exeperiencing any depression -- --     Interventions Stress management education;Relaxation education;Encouraged to attend Cardiac Rehabilitation for the exercise Stress management education;Relaxation education;Encouraged to attend Cardiac Rehabilitation for the exercise Stress management education;Relaxation education;Encouraged to attend Cardiac Rehabilitation for the exercise     Continue Psychosocial Services  No Follow up required No Follow up required No Follow up required       Initial Review   Source of Stress Concerns Chronic Illness Chronic Illness Chronic Illness     Comments Will continue to montior and offer support as needed. Will continue to montior and offer support as needed. Will continue to montior and offer support as needed.        Psychosocial Discharge (Final Psychosocial Re-Evaluation):  Psychosocial Re-Evaluation - 11/20/23 1751       Psychosocial Re-Evaluation   Current issues with Current  Anxiety/Panic;Current Stress Concerns    Comments Manuel Pearson continues not voice any increased concerns or stressors during exercise at cardiac rehab.    Interventions Stress management education;Relaxation education;Encouraged to attend Cardiac Rehabilitation for the exercise    Continue Psychosocial Services  No Follow up required      Initial Review   Source of Stress Concerns Chronic Illness    Comments Will continue to montior and offer support as needed.          Vocational Rehabilitation: Provide vocational rehab assistance to qualifying candidates.   Vocational Rehab Evaluation & Intervention:  Vocational Rehab - 08/29/23 1114       Initial Vocational Rehab Evaluation & Intervention   Assessment shows need for Vocational Rehabilitation No      Vocational Rehab Re-Evaulation   Comments Manuel Pearson is disabled due to chronic back pain, seizures. No VR needs at this time.          Education: Education Goals: Education classes will be provided on a weekly basis, covering required topics. Participant will state understanding/return demonstration of topics presented.    Education     Row Name 09/04/23 1100     Education   Cardiac Education Topics Pritikin   Select Core Videos     Core Videos   Educator Exercise Physiologist   Select Exercise Education   Exercise Education Biomechanial Limitations   Instruction Review Code 1- Verbalizes Understanding   Class Start Time 1155   Class Stop Time 1235   Class Time Calculation (min) 40 min    Row Name 09/08/23 1100  Education   Cardiac Education Topics Pritikin   Licensed conveyancer Nutrition   Nutrition Vitamins and Minerals   Instruction Review Code 1- Verbalizes Understanding   Class Start Time 1145   Class Stop Time 1225   Class Time Calculation (min) 40 min    Row Name 09/11/23 1600     Education   Cardiac Education Topics Pritikin   Building control surveyor Dietitian   Nutrition Fueling a Healthy Body   Instruction Review Code 1- Verbalizes Understanding   Class Start Time 1145   Class Stop Time 1230   Class Time Calculation (min) 45 min    Row Name 09/13/23 1500     Education   Cardiac Education Topics Pritikin   Customer service manager   Weekly Topic International Cuisine- Spotlight on the [employer] Zones   Instruction Review Code 1- Verbalizes Understanding   Class Start Time 1145   Class Stop Time 1225   Class Time Calculation (min) 40 min    Row Name 09/18/23 1200     Education   Cardiac Education Topics Pritikin   Geographical information systems officer Psychosocial   Psychosocial Workshop Healthy Sleep for a Healthy Heart   Instruction Review Code 1- Verbalizes Understanding   Class Start Time 1150   Class Stop Time 1230   Class Time Calculation (min) 40 min    Row Name 09/20/23 1100     Education   Cardiac Education Topics Pritikin   Customer service manager   Weekly Topic Simple Sides and Sauces   Instruction Review Code 1- Verbalizes Understanding   Class Start Time 1145   Class Stop Time 1225   Class Time Calculation (min) 40 min    Row Name 09/22/23 1200     Education   Cardiac Education Topics Pritikin   Nurse, children's Exercise Physiologist   Select Psychosocial   Psychosocial How Our Thoughts Can Heal Our Hearts   Instruction Review Code 1- Verbalizes Understanding   Class Start Time 1153   Class Stop Time 1241   Class Time Calculation (min) 48 min    Row Name 09/25/23 1200     Education   Cardiac Education Topics Pritikin   Hospital doctor Education   General Education Hypertension and Heart Disease   Instruction Review Code 1- Verbalizes Understanding   Class  Start Time 1150   Class Stop Time 1225   Class Time Calculation (min) 35 min    Row Name 09/27/23 1100     Education   Cardiac Education Topics Pritikin   Customer service manager   Weekly Topic Powerhouse Plant-Based Proteins   Instruction Review Code 1- Verbalizes Understanding   Class Start Time 1145   Class Stop Time 1225   Class Time Calculation (min) 40 min    Row Name 09/29/23 1300     Education   Cardiac Education Topics Pritikin   Geographical information systems officer Exercise  Exercise Workshop Managing Heart Disease: Your Path to a Healthier Heart   Instruction Review Code 1- Verbalizes Understanding   Class Start Time 1148   Class Stop Time 1239   Class Time Calculation (min) 51 min    Row Name 10/02/23 1300     Education   Cardiac Education Topics Pritikin   Geographical information systems officer Psychosocial   Psychosocial Workshop From Head to Heart: The Power of a Healthy Outlook   Instruction Review Code 1- Verbalizes Understanding   Class Start Time 1152   Class Stop Time 1234   Class Time Calculation (min) 42 min    Row Name 10/09/23 1300     Education   Cardiac Education Topics Pritikin   Nurse, children's Exercise Physiologist   Select Psychosocial   Psychosocial Healthy Minds, Bodies, Hearts   Instruction Review Code 1- Verbalizes Understanding   Class Start Time 1157   Class Stop Time 1233   Class Time Calculation (min) 36 min    Row Name 10/11/23 1600     Education   Cardiac Education Topics Pritikin   Customer service manager   Weekly Topic Adding Flavor - Sodium-Free   Instruction Review Code 1- Verbalizes Understanding   Class Start Time 1140   Class Stop Time 1215   Class Time Calculation (min) 35 min    Row Name 10/16/23 1500     Education    Cardiac Education Topics Pritikin   Glass blower/designer Nutrition   Nutrition Workshop Label Reading   Instruction Review Code 1- Verbalizes Understanding   Class Start Time 1145   Class Stop Time 1230   Class Time Calculation (min) 45 min    Row Name 10/25/23 1100     Education   Cardiac Education Topics Pritikin   Customer service manager   Weekly Topic Personalizing Your Pritikin Plate   Instruction Review Code 1- Verbalizes Understanding   Class Start Time 1145   Class Stop Time 1225   Class Time Calculation (min) 40 min    Row Name 10/27/23 1100     Education   Cardiac Education Topics Pritikin   Licensed conveyancer Nutrition   Nutrition Overview of the Pritikin Eating Plan   Instruction Review Code [address] Understanding    Row Name 10/30/23 1100     Education   Cardiac Education Topics Pritikin   Select Workshops     Workshops   Educator Exercise Physiologist   Select Psychosocial   Psychosocial Workshop Recognizing and Reducing Stress   Instruction Review Code 1- Verbalizes Understanding   Class Start Time 1150   Class Stop Time 1232   Class Time Calculation (min) 42 min    Row Name 11/01/23 1100     Education   Cardiac Education Topics Pritikin   Orthoptist   Educator Dietitian   Weekly Topic Delicious Desserts   Instruction Review Code 1- Verbalizes Understanding   Class Start Time 1145   Class Stop Time 1225   Class Time Calculation (min) 40 min    Row Name 11/06/23 1100  Education   Cardiac Education Topics Pritikin   Geographical information systems officer Exercise   Exercise Workshop Exercise Basics: Diplomatic Services operational officer   Instruction Review Code 1- Verbalizes Understanding   Class Start Time 1155   Class Stop Time 1248    Class Time Calculation (min) 53 min    Row Name 11/10/23 1100     Education   Cardiac Education Topics Pritikin   Psychologist, forensic Exercise Education   Exercise Education Move It!   Instruction Review Code 1- Verbalizes Understanding   Class Start Time 1145   Class Stop Time 1224   Class Time Calculation (min) 39 min    Row Name 11/15/23 1100     Education   Cardiac Education Topics Pritikin   Customer service manager   Weekly Topic One-Pot Wonders   Instruction Review Code 1- Verbalizes Understanding   Class Start Time 1146   Class Stop Time 1225   Class Time Calculation (min) 39 min    Row Name 11/20/23 1100     Education   Cardiac Education Topics Pritikin   Geographical information systems officer Psychosocial   Psychosocial Workshop Focused Goals, Sustainable Changes   Instruction Review Code 1- Verbalizes Understanding   Class Start Time 1155   Class Stop Time 1235   Class Time Calculation (min) 40 min      Core Videos: Exercise    Move It!  Clinical staff conducted group or individual video education with verbal and written material and guidebook.  Patient learns the recommended Pritikin exercise program. Exercise with the goal of living a long, healthy life. Some of the health benefits of exercise include controlled diabetes, healthier blood pressure levels, improved cholesterol levels, improved heart and lung capacity, improved sleep, and better body composition. Everyone should speak with their doctor before starting or changing an exercise routine.  Biomechanical Limitations Clinical staff conducted group or individual video education with verbal and written material and guidebook.  Patient learns how biomechanical limitations can impact exercise and how we can mitigate and possibly overcome limitations to have an impactful  and balanced exercise routine.  Body Composition Clinical staff conducted group or individual video education with verbal and written material and guidebook.  Patient learns that body composition (ratio of muscle mass to fat mass) is a key component to assessing overall fitness, rather than body weight alone. Increased fat mass, especially visceral belly fat, can put us  at increased risk for metabolic syndrome, type 2 diabetes, heart disease, and even death. It is recommended to combine diet and exercise (cardiovascular and resistance training) to improve your body composition. Seek guidance from your physician and exercise physiologist before implementing an exercise routine.  Exercise Action Plan Clinical staff conducted group or individual video education with verbal and written material and guidebook.  Patient learns the recommended strategies to achieve and enjoy long-term exercise adherence, including variety, self-motivation, self-efficacy, and positive decision making. Benefits of exercise include fitness, good health, weight management, more energy, better sleep, less stress, and overall well-being.  Medical   Heart Disease Risk Reduction Clinical staff conducted group or individual video education with verbal and written material and guidebook.  Patient learns our heart is our most vital organ as it  circulates oxygen, nutrients, white blood cells, and hormones throughout the entire body, and carries waste away. Data supports a plant-based eating plan like the Pritikin Program for its effectiveness in slowing progression of and reversing heart disease. The video provides a number of recommendations to address heart disease.   Metabolic Syndrome and Belly Fat  Clinical staff conducted group or individual video education with verbal and written material and guidebook.  Patient learns what metabolic syndrome is, how it leads to heart disease, and how one can reverse it and keep it from coming  back. You have metabolic syndrome if you have 3 of the following 5 criteria: abdominal obesity, high blood pressure, high triglycerides, low HDL cholesterol, and high blood sugar.  Hypertension and Heart Disease Clinical staff conducted group or individual video education with verbal and written material and guidebook.  Patient learns that high blood pressure, or hypertension, is very common in the [place] . Hypertension is largely due to excessive salt intake, but other important risk factors include being overweight, physical inactivity, drinking too much alcohol, smoking, and not eating enough potassium from fruits and vegetables. High blood pressure is a leading risk factor for heart attack, stroke, congestive heart failure, dementia, kidney failure, and premature death. Long-term effects of excessive salt intake include stiffening of the arteries and thickening of heart muscle and organ damage. Recommendations include ways to reduce hypertension and the risk of heart disease.  Diseases of Our Time - Focusing on Diabetes Clinical staff conducted group or individual video education with verbal and written material and guidebook.  Patient learns why the best way to stop diseases of our time is prevention, through food and other lifestyle changes. Medicine (such as prescription pills and surgeries) is often only a Band-Aid on the problem, not a long-term solution. Most common diseases of our time include obesity, type 2 diabetes, hypertension, heart disease, and cancer. The Pritikin Program is recommended and has been proven to help reduce, reverse, and/or prevent the damaging effects of metabolic syndrome.  Nutrition   Overview of the Pritikin Eating Plan  Clinical staff conducted group or individual video education with verbal and written material and guidebook.  Patient learns about the Pritikin Eating Plan for disease risk reduction. The Pritikin Eating Plan emphasizes a wide variety of  unrefined, minimally-processed carbohydrates, like fruits, vegetables, whole grains, and legumes. Go, Caution, and Stop food choices are explained. Plant-based and lean animal proteins are emphasized. Rationale provided for low sodium intake for blood pressure control, low added sugars for blood sugar stabilization, and low added fats and oils for coronary artery disease risk reduction and weight management.  Calorie Density  Clinical staff conducted group or individual video education with verbal and written material and guidebook.  Patient learns about calorie density and how it impacts the Pritikin Eating Plan. Knowing the characteristics of the food you choose will help you decide whether those foods will lead to weight gain or weight loss, and whether you want to consume more or less of them. Weight loss is usually a side effect of the Pritikin Eating Plan because of its focus on low calorie-dense foods.  Label Reading  Clinical staff conducted group or individual video education with verbal and written material and guidebook.  Patient learns about the Pritikin recommended label reading guidelines and corresponding recommendations regarding calorie density, added sugars, sodium content, and whole grains.  Dining Out - Part 1  Clinical staff conducted group or individual video education with verbal and written material and  guidebook.  Patient learns that restaurant meals can be sabotaging because they can be so high in calories, fat, sodium, and/or sugar. Patient learns recommended strategies on how to positively address this and avoid unhealthy pitfalls.  Facts on Fats  Clinical staff conducted group or individual video education with verbal and written material and guidebook.  Patient learns that lifestyle modifications can be just as effective, if not more so, as many medications for lowering your risk of heart disease. A Pritikin lifestyle can help to reduce your risk of inflammation and  atherosclerosis (cholesterol build-up, or plaque, in the artery walls). Lifestyle interventions such as dietary choices and physical activity address the cause of atherosclerosis. A review of the types of fats and their impact on blood cholesterol levels, along with dietary recommendations to reduce fat intake is also included.  Nutrition Action Plan  Clinical staff conducted group or individual video education with verbal and written material and guidebook.  Patient learns how to incorporate Pritikin recommendations into their lifestyle. Recommendations include planning and keeping personal health goals in mind as an important part of their success.  Healthy Mind-Set    Healthy Minds, Bodies, Hearts  Clinical staff conducted group or individual video education with verbal and written material and guidebook.  Patient learns how to identify when they are stressed. Video will discuss the impact of that stress, as well as the many benefits of stress management. Patient will also be introduced to stress management techniques. The way we think, act, and feel has an impact on our hearts.  How Our Thoughts Can Heal Our Hearts  Clinical staff conducted group or individual video education with verbal and written material and guidebook.  Patient learns that negative thoughts can cause depression and anxiety. This can result in negative lifestyle behavior and serious health problems. Cognitive behavioral therapy is an effective method to help control our thoughts in order to change and improve our emotional outlook.  Additional Videos:  Exercise    Improving Performance  Clinical staff conducted group or individual video education with verbal and written material and guidebook.  Patient learns to use a non-linear approach by alternating intensity levels and lengths of time spent exercising to help burn more calories and lose more body fat. Cardiovascular exercise helps improve heart health, metabolism,  hormonal balance, blood sugar control, and recovery from fatigue. Resistance training improves strength, endurance, balance, coordination, reaction time, metabolism, and muscle mass. Flexibility exercise improves circulation, posture, and balance. Seek guidance from your physician and exercise physiologist before implementing an exercise routine and learn your capabilities and proper form for all exercise.  Introduction to Yoga  Clinical staff conducted group or individual video education with verbal and written material and guidebook.  Patient learns about yoga, a discipline of the coming together of mind, breath, and body. The benefits of yoga include improved flexibility, improved range of motion, better posture and core strength, increased lung function, weight loss, and positive self-image. Yoga's heart health benefits include lowered blood pressure, healthier heart rate, decreased cholesterol and triglyceride levels, improved immune function, and reduced stress. Seek guidance from your physician and exercise physiologist before implementing an exercise routine and learn your capabilities and proper form for all exercise.  Medical   Aging: Enhancing Your Quality of Life  Clinical staff conducted group or individual video education with verbal and written material and guidebook.  Patient learns key strategies and recommendations to stay in good physical health and enhance quality of life, such as prevention strategies, having  an advocate, securing a Health Care Proxy and Power of Attorney, and keeping a list of medications and system for tracking them. It also discusses how to avoid risk for bone loss.  Biology of Weight Control  Clinical staff conducted group or individual video education with verbal and written material and guidebook.  Patient learns that weight gain occurs because we consume more calories than we burn (eating more, moving less). Even if your body weight is normal, you may have  higher ratios of fat compared to muscle mass. Too much body fat puts you at increased risk for cardiovascular disease, heart attack, stroke, type 2 diabetes, and obesity-related cancers. In addition to exercise, following the Pritikin Eating Plan can help reduce your risk.  Decoding Lab Results  Clinical staff conducted group or individual video education with verbal and written material and guidebook.  Patient learns that lab test reflects one measurement whose values change over time and are influenced by many factors, including medication, stress, sleep, exercise, food, hydration, pre-existing medical conditions, and more. It is recommended to use the knowledge from this video to become more involved with your lab results and evaluate your numbers to speak with your doctor.   Diseases of Our Time - Overview  Clinical staff conducted group or individual video education with verbal and written material and guidebook.  Patient learns that according to the CDC, 50% to 70% of chronic diseases (such as obesity, type 2 diabetes, elevated lipids, hypertension, and heart disease) are avoidable through lifestyle improvements including healthier food choices, listening to satiety cues, and increased physical activity.  Sleep Disorders Clinical staff conducted group or individual video education with verbal and written material and guidebook.  Patient learns how good quality and duration of sleep are important to overall health and well-being. Patient also learns about sleep disorders and how they impact health along with recommendations to address them, including discussing with a physician.  Nutrition  Dining Out - Part 2 Clinical staff conducted group or individual video education with verbal and written material and guidebook.  Patient learns how to plan ahead and communicate in order to maximize their dining experience in a healthy and nutritious manner. Included are recommended food choices based on  the type of restaurant the patient is visiting.   Fueling a Banker conducted group or individual video education with verbal and written material and guidebook.  There is a strong connection between our food choices and our health. Diseases like obesity and type 2 diabetes are very prevalent and are in large-part due to lifestyle choices. The Pritikin Eating Plan provides plenty of food and hunger-curbing satisfaction. It is easy to follow, affordable, and helps reduce health risks.  Menu Workshop  Clinical staff conducted group or individual video education with verbal and written material and guidebook.  Patient learns that restaurant meals can sabotage health goals because they are often packed with calories, fat, sodium, and sugar. Recommendations include strategies to plan ahead and to communicate with the manager, chef, or server to help order a healthier meal.  Planning Your Eating Strategy  Clinical staff conducted group or individual video education with verbal and written material and guidebook.  Patient learns about the Pritikin Eating Plan and its benefit of reducing the risk of disease. The Pritikin Eating Plan does not focus on calories. Instead, it emphasizes high-quality, nutrient-rich foods. By knowing the characteristics of the foods, we choose, we can determine their calorie density and make informed decisions.  Targeting  Your Nutrition Priorities  Clinical staff conducted group or individual video education with verbal and written material and guidebook.  Patient learns that lifestyle habits have a tremendous impact on disease risk and progression. This video provides eating and physical activity recommendations based on your personal health goals, such as reducing LDL cholesterol, losing weight, preventing or controlling type 2 diabetes, and reducing high blood pressure.  Vitamins and Minerals  Clinical staff conducted group or individual video education  with verbal and written material and guidebook.  Patient learns different ways to obtain key vitamins and minerals, including through a recommended healthy diet. It is important to discuss all supplements you take with your doctor.   Healthy Mind-Set    Smoking Cessation  Clinical staff conducted group or individual video education with verbal and written material and guidebook.  Patient learns that cigarette smoking and tobacco addiction pose a serious health risk which affects millions of people. Stopping smoking will significantly reduce the risk of heart disease, lung disease, and many forms of cancer. Recommended strategies for quitting are covered, including working with your doctor to develop a successful plan.  Culinary   Becoming a Set designer conducted group or individual video education with verbal and written material and guidebook.  Patient learns that cooking at home can be healthy, cost-effective, quick, and puts them in control. Keys to cooking healthy recipes will include looking at your recipe, assessing your equipment needs, planning ahead, making it simple, choosing cost-effective seasonal ingredients, and limiting the use of added fats, salts, and sugars.  Cooking - Breakfast and Snacks  Clinical staff conducted group or individual video education with verbal and written material and guidebook.  Patient learns how important breakfast is to satiety and nutrition through the entire day. Recommendations include key foods to eat during breakfast to help stabilize blood sugar levels and to prevent overeating at meals later in the day. Planning ahead is also a key component.  Cooking - Educational psychologist conducted group or individual video education with verbal and written material and guidebook.  Patient learns eating strategies to improve overall health, including an approach to cook more at home. Recommendations include thinking of animal protein  as a side on your plate rather than center stage and focusing instead on lower calorie dense options like vegetables, fruits, whole grains, and plant-based proteins, such as beans. Making sauces in large quantities to freeze for later and leaving the skin on your vegetables are also recommended to maximize your experience.  Cooking - Healthy Salads and Dressing Clinical staff conducted group or individual video education with verbal and written material and guidebook.  Patient learns that vegetables, fruits, whole grains, and legumes are the foundations of the Pritikin Eating Plan. Recommendations include how to incorporate each of these in flavorful and healthy salads, and how to create homemade salad dressings. Proper handling of ingredients is also covered. Cooking - Soups and [employer] - Soups and Desserts Clinical staff conducted group or individual video education with verbal and written material and guidebook.  Patient learns that Pritikin soups and desserts make for easy, nutritious, and delicious snacks and meal components that are low in sodium, fat, sugar, and calorie density, while high in vitamins, minerals, and filling fiber. Recommendations include simple and healthy ideas for soups and desserts.   Overview     The Pritikin Solution Program Overview Clinical staff conducted group or individual video education with verbal and written material and guidebook.  Patient learns that the results of the Pritikin Program have been documented in more than 100 articles published in peer-reviewed journals, and the benefits include reducing risk factors for (and, in some cases, even reversing) high cholesterol, high blood pressure, type 2 diabetes, obesity, and more! An overview of the three key pillars of the Pritikin Program will be covered: eating well, doing regular exercise, and having a healthy mind-set.  WORKSHOPS  Exercise: Exercise Basics: Building Your Action Plan Clinical staff  led group instruction and group discussion with PowerPoint presentation and patient guidebook. To enhance the learning environment the use of posters, models and videos may be added. At the conclusion of this workshop, patients will comprehend the difference between physical activity and exercise, as well as the benefits of incorporating both, into their routine. Patients will understand the FITT (Frequency, Intensity, Time, and Type) principle and how to use it to build an exercise action plan. In addition, safety concerns and other considerations for exercise and cardiac rehab will be addressed by the presenter. The purpose of this lesson is to promote a comprehensive and effective weekly exercise routine in order to improve patients' overall level of fitness.   Managing Heart Disease: Your Path to a Healthier Heart Clinical staff led group instruction and group discussion with PowerPoint presentation and patient guidebook. To enhance the learning environment the use of posters, models and videos may be added.At the conclusion of this workshop, patients will understand the anatomy and physiology of the heart. Additionally, they will understand how Pritikin's three pillars impact the risk factors, the progression, and the management of heart disease.  The purpose of this lesson is to provide a high-level overview of the heart, heart disease, and how the Pritikin lifestyle positively impacts risk factors.  Exercise Biomechanics Clinical staff led group instruction and group discussion with PowerPoint presentation and patient guidebook. To enhance the learning environment the use of posters, models and videos may be added. Patients will learn how the structural parts of their bodies function and how these functions impact their daily activities, movement, and exercise. Patients will learn how to promote a neutral spine, learn how to manage pain, and identify ways to improve their physical movement  in order to promote healthy living. The purpose of this lesson is to expose patients to common physical limitations that impact physical activity. Participants will learn practical ways to adapt and manage aches and pains, and to minimize their effect on regular exercise. Patients will learn how to maintain good posture while sitting, walking, and lifting.  Balance Training and Fall Prevention  Clinical staff led group instruction and group discussion with PowerPoint presentation and patient guidebook. To enhance the learning environment the use of posters, models and videos may be added. At the conclusion of this workshop, patients will understand the importance of their sensorimotor skills (vision, proprioception, and the vestibular system) in maintaining their ability to balance as they age. Patients will apply a variety of balancing exercises that are appropriate for their current level of function. Patients will understand the common causes for poor balance, possible solutions to these problems, and ways to modify their physical environment in order to minimize their fall risk. The purpose of this lesson is to teach patients about the importance of maintaining balance as they age and ways to minimize their risk of falling.  WORKSHOPS   Nutrition:  Fueling a Ship broker led group instruction and group discussion with PowerPoint presentation and patient guidebook. To enhance the learning  environment the use of posters, models and videos may be added. Patients will review the foundational principles of the Pritikin Eating Plan and understand what constitutes a serving size in each of the food groups. Patients will also learn Pritikin-friendly foods that are better choices when away from home and review make-ahead meal and snack options. Calorie density will be reviewed and applied to three nutrition priorities: weight maintenance, weight loss, and weight gain. The purpose of this  lesson is to reinforce (in a group setting) the key concepts around what patients are recommended to eat and how to apply these guidelines when away from home by planning and selecting Pritikin-friendly options. Patients will understand how calorie density may be adjusted for different weight management goals.  Mindful Eating  Clinical staff led group instruction and group discussion with PowerPoint presentation and patient guidebook. To enhance the learning environment the use of posters, models and videos may be added. Patients will briefly review the concepts of the Pritikin Eating Plan and the importance of low-calorie dense foods. The concept of mindful eating will be introduced as well as the importance of paying attention to internal hunger signals. Triggers for non-hunger eating and techniques for dealing with triggers will be explored. The purpose of this lesson is to provide patients with the opportunity to review the basic principles of the Pritikin Eating Plan, discuss the value of eating mindfully and how to measure internal cues of hunger and fullness using the Hunger Scale. Patients will also discuss reasons for non-hunger eating and learn strategies to use for controlling emotional eating.  Targeting Your Nutrition Priorities Clinical staff led group instruction and group discussion with PowerPoint presentation and patient guidebook. To enhance the learning environment the use of posters, models and videos may be added. Patients will learn how to determine their genetic susceptibility to disease by reviewing their family history. Patients will gain insight into the importance of diet as part of an overall healthy lifestyle in mitigating the impact of genetics and other environmental insults. The purpose of this lesson is to provide patients with the opportunity to assess their personal nutrition priorities by looking at their family history, their own health history and current risk factors.  Patients will also be able to discuss ways of prioritizing and modifying the Pritikin Eating Plan for their highest risk areas  Menu  Clinical staff led group instruction and group discussion with PowerPoint presentation and patient guidebook. To enhance the learning environment the use of posters, models and videos may be added. Using menus brought in from [employer], or printed from [employer], patients will apply the Pritikin dining out guidelines that were presented in the [employer] video. Patients will also be able to practice these guidelines in a variety of provided scenarios. The purpose of this lesson is to provide patients with the opportunity to practice hands-on learning of the Pritikin Dining Out guidelines with actual menus and practice scenarios.  Label Reading Clinical staff led group instruction and group discussion with PowerPoint presentation and patient guidebook. To enhance the learning environment the use of posters, models and videos may be added. Patients will review and discuss the Pritikin label reading guidelines presented in Pritikin's Label Reading Educational series video. Using fool labels brought in from local grocery stores and markets, patients will apply the label reading guidelines and determine if the packaged food meet the Pritikin guidelines. The purpose of this lesson is to provide patients with the opportunity to review, discuss, and practice  hands-on learning of the Pritikin Label Reading guidelines with actual packaged food labels. Cooking School  Pritikin's [employer] are designed to teach patients ways to prepare quick, simple, and affordable recipes at home. The importance of nutrition's role in chronic disease risk reduction is reflected in its emphasis in the overall Pritikin program. By learning how to prepare essential core Pritikin Eating Plan recipes, patients will increase control over what they eat; be able to  customize the flavor of foods without the use of added salt, sugar, or fat; and improve the quality of the food they consume. By learning a set of core recipes which are easily assembled, quickly prepared, and affordable, patients are more likely to prepare more healthy foods at home. These workshops focus on convenient breakfasts, simple entres, side dishes, and desserts which can be prepared with minimal effort and are consistent with nutrition recommendations for cardiovascular risk reduction. Cooking [employer] are taught by a Armed forces logistics/support/administrative officer (RD) who has been trained by the [employer]. The chef or RD has a clear understanding of the importance of minimizing - if not completely eliminating - added fat, sugar, and sodium in recipes. Throughout the series of Cooking School Workshop sessions, patients will learn about healthy ingredients and efficient methods of cooking to build confidence in their capability to prepare    Cooking School weekly topics:  Adding Flavor- Sodium-Free  Fast and Healthy Breakfasts  Powerhouse Plant-Based Proteins  Satisfying Salads and Dressings  Simple Sides and Sauces  International Cuisine-Spotlight on the [employer] Zones  Delicious Desserts  Savory Soups  [employer] - Meals in a Astronomer Appetizers and Snacks  Comforting Weekend Breakfasts  One-Pot Wonders   Fast Evening Meals  Landscape architect Your Pritikin Plate  WORKSHOPS   Healthy Mindset (Psychosocial):  Focused Goals, Sustainable Changes Clinical staff led group instruction and group discussion with PowerPoint presentation and patient guidebook. To enhance the learning environment the use of posters, models and videos may be added. Patients will be able to apply effective goal setting strategies to establish at least one personal goal, and then take consistent, meaningful action toward that goal. They will learn to identify common barriers to  achieving personal goals and develop strategies to overcome them. Patients will also gain an understanding of how our mind-set can impact our ability to achieve goals and the importance of cultivating a positive and growth-oriented mind-set. The purpose of this lesson is to provide patients with a deeper understanding of how to set and achieve personal goals, as well as the tools and strategies needed to overcome common obstacles which may arise along the way.  From Head to Heart: The Power of a Healthy Outlook  Clinical staff led group instruction and group discussion with PowerPoint presentation and patient guidebook. To enhance the learning environment the use of posters, models and videos may be added. Patients will be able to recognize and describe the impact of emotions and mood on physical health. They will discover the importance of self-care and explore self-care practices which may work for them. Patients will also learn how to utilize the 4 C's to cultivate a healthier outlook and better manage stress and challenges. The purpose of this lesson is to demonstrate to patients how a healthy outlook is an essential part of maintaining good health, especially as they continue their cardiac rehab journey.  Healthy Sleep for a Healthy Heart Clinical staff led group instruction and group discussion with PowerPoint  presentation and patient guidebook. To enhance the learning environment the use of posters, models and videos may be added. At the conclusion of this workshop, patients will be able to demonstrate knowledge of the importance of sleep to overall health, well-being, and quality of life. They will understand the symptoms of, and treatments for, common sleep disorders. Patients will also be able to identify daytime and nighttime behaviors which impact sleep, and they will be able to apply these tools to help manage sleep-related challenges. The purpose of this lesson is to provide patients with a  general overview of sleep and outline the importance of quality sleep. Patients will learn about a few of the most common sleep disorders. Patients will also be introduced to the concept of "sleep hygiene," and discover ways to self-manage certain sleeping problems through simple daily behavior changes. Finally, the workshop will motivate patients by clarifying the links between quality sleep and their goals of heart-healthy living.   Recognizing and Reducing Stress Clinical staff led group instruction and group discussion with PowerPoint presentation and patient guidebook. To enhance the learning environment the use of posters, models and videos may be added. At the conclusion of this workshop, patients will be able to understand the types of stress reactions, differentiate between acute and chronic stress, and recognize the impact that chronic stress has on their health. They will also be able to apply different coping mechanisms, such as reframing negative self-talk. Patients will have the opportunity to practice a variety of stress management techniques, such as deep abdominal breathing, progressive muscle relaxation, and/or guided imagery.  The purpose of this lesson is to educate patients on the role of stress in their lives and to provide healthy techniques for coping with it.  Learning Barriers/Preferences:  Learning Barriers/Preferences - 08/29/23 1114       Learning Barriers/Preferences   Learning Barriers Hearing   Some hearing loss. Wears glasses.   Learning Preferences Skilled Demonstration          Education Topics:  Knowledge Questionnaire Score:  Knowledge Questionnaire Score - 08/29/23 1421       Knowledge Questionnaire Score   Pre Score 27/28          Core Components/Risk Factors/Patient Goals at Admission:  Personal Goals and Risk Factors at Admission - 08/29/23 1144       Core Components/Risk Factors/Patient Goals on Admission    Weight Management  Yes;Obesity;Weight Loss    Intervention Weight Management/Obesity: Establish reasonable short term and long term weight goals.;Obesity: Provide education and appropriate resources to help participant work on and attain dietary goals.    Admit Weight 238 lb 12.1 oz (108.3 kg)    Expected Outcomes Short Term: Continue to assess and modify interventions until short term weight is achieved;Long Term: Adherence to nutrition and physical activity/exercise program aimed toward attainment of established weight goal;Weight Loss: Understanding of general recommendations for a balanced deficit meal plan, which promotes 1-2 lb weight loss per week and includes a negative energy balance of [phone] kcal/d    Tobacco Cessation Yes    Number of packs per day 0.3    Intervention Assist the participant in steps to quit. Provide individualized education and counseling about committing to Tobacco Cessation, relapse prevention, and pharmacological support that can be provided by physician.;Education officer, environmental, assist with locating and accessing local/national Quit Smoking programs, and support quit date choice.    Expected Outcomes Short Term: Will demonstrate readiness to quit, by selecting a quit date.;Long Term: Complete abstinence  from all tobacco products for at least 12 months from quit date.;Short Term: Will quit all tobacco product use, adhering to prevention of relapse plan.    Hypertension Yes    Intervention Provide education on lifestyle modifcations including regular physical activity/exercise, weight management, moderate sodium restriction and increased consumption of fresh fruit, vegetables, and low fat dairy, alcohol moderation, and smoking cessation.;Monitor prescription use compliance.    Expected Outcomes Short Term: Continued assessment and intervention until BP is < 140/68mm HG in hypertensive participants. < 130/61mm HG in hypertensive participants with diabetes, heart failure or chronic kidney  disease.;Long Term: Maintenance of blood pressure at goal levels.    Lipids Yes    Intervention Provide education and support for participant on nutrition & aerobic/resistive exercise along with prescribed medications to achieve LDL 70mg , HDL >40mg .    Expected Outcomes Short Term: Participant states understanding of desired cholesterol values and is compliant with medications prescribed. Participant is following exercise prescription and nutrition guidelines.;Long Term: Cholesterol controlled with medications as prescribed, with individualized exercise RX and with personalized nutrition plan. Value goals: LDL < 70mg , HDL > 40 mg.    Stress Yes    Intervention Offer individual and/or small group education and counseling on adjustment to heart disease, stress management and health-related lifestyle change. Teach and support self-help strategies.;Refer participants experiencing significant psychosocial distress to appropriate mental health specialists for further evaluation and treatment. When possible, include family members and significant others in education/counseling sessions.    Expected Outcomes Short Term: Participant demonstrates changes in health-related behavior, relaxation and other stress management skills, ability to obtain effective social support, and compliance with psychotropic medications if prescribed.;Long Term: Emotional wellbeing is indicated by absence of clinically significant psychosocial distress or social isolation.          Core Components/Risk Factors/Patient Goals Review:   Goals and Risk Factor Review     Row Name 09/19/23 1529 10/24/23 1532 11/20/23 1753         Core Components/Risk Factors/Patient Goals Review   Personal Goals Review Weight Management/Obesity;Lipids;Hypertension;Stress;Tobacco Cessation Weight Management/Obesity;Lipids;Hypertension;Stress;Tobacco Cessation Weight Management/Obesity;Lipids;Hypertension;Stress;Tobacco Cessation     Review Manuel Pearson has  been doing well with exercise at cardiac rehab. vital signs have been stable. Manuel Pearson has lost 2.3 kg since starting cardiac rehab. [name]  do well with exercise at cardiac rehab. vital signs have been stable. Manuel Pearson has lost 5.6  kg since starting cardiac rehab. Manuel Pearson has increased his workloads. Will continue to monitor heart rate as beta blocker was chnaged. Manuel Pearson  continues to do  well with exercise at cardiac rehab. vital signs remain stable. Manuel Pearson has lost 7.3   kg since starting cardiac rehab. Manuel Pearson has increased his workloads. Will continue to monitor heart rate as beta blocker was chnaged.     Expected Outcomes Manuel Pearson will continue to participate in exercise at cardiac rehab for exercise, nutrition and lifestyle modifications. Manuel Pearson will continue to participate in exercise at cardiac rehab for exercise, nutrition and lifestyle modifications. Manuel Pearson will continue to participate in exercise at cardiac rehab for exercise, nutrition and lifestyle modifications.        Core Components/Risk Factors/Patient Goals at Discharge (Final Review):   Goals and Risk Factor Review - 11/20/23 1753       Core Components/Risk Factors/Patient Goals Review   Personal Goals Review Weight Management/Obesity;Lipids;Hypertension;Stress;Tobacco Cessation    Review Manuel Pearson  continues to do  well with exercise at cardiac rehab. vital signs remain stable. Manuel Pearson has lost 7.3   kg since starting cardiac  rehab. Manuel Pearson has increased his workloads. Will continue to monitor heart rate as beta blocker was chnaged.    Expected Outcomes Manuel Pearson will continue to participate in exercise at cardiac rehab for exercise, nutrition and lifestyle modifications.          ITP Comments:  ITP Comments     Row Name 08/29/23 1030 09/04/23 1136 09/13/23 1705 09/19/23 1526 10/24/23 1520   ITP Comments Medical Director- Dr. Wilbert Bihari, MD. Introduction to the [facility] Intensive [facility] Program. Manuel Pearson tolerated exercise well  without symptoms. Acclimated well to the exercise routine. 30 Day ITP Review. Manuel Pearson started cardiac rehab on 09/04/23 and is off to a good start to exercise. 30 Day ITP Review. Manuel Pearson has good attendance and participation with exercise at cardiac rehab 30 Day ITP Review. Manuel Pearson continues to have  good attendance and participation with exercise at cardiac rehab.    Row Name 11/20/23 1750           ITP Comments 30 Day ITP Review. Manuel Pearson continues to have  good attendance and participation with exercise at cardiac rehab. Manuel Pearson will tentatively complete cardiac rehab at the end of September.          Comments: See ITP Comments

## 2023-11-22 ENCOUNTER — Encounter (HOSPITAL_COMMUNITY)
Admission: RE | Admit: 2023-11-22 | Discharge: 2023-11-22 | Disposition: A | Discharge status: Home / Self Care | Source: Ambulatory Visit | Attending: Cardiology | Admitting: Cardiology

## 2023-11-22 DIAGNOSIS — I2111 ST elevation (STEMI) myocardial infarction involving right coronary artery: Secondary | ICD-10-CM

## 2023-11-22 DIAGNOSIS — Z955 Presence of coronary angioplasty implant and graft: Secondary | ICD-10-CM

## 2023-11-24 ENCOUNTER — Encounter (HOSPITAL_COMMUNITY)
Admission: RE | Admit: 2023-11-24 | Discharge: 2023-11-24 | Disposition: A | Discharge status: Home / Self Care | Source: Ambulatory Visit | Attending: Cardiology | Admitting: Cardiology

## 2023-11-24 DIAGNOSIS — I2111 ST elevation (STEMI) myocardial infarction involving right coronary artery: Secondary | ICD-10-CM

## 2023-11-24 DIAGNOSIS — Z955 Presence of coronary angioplasty implant and graft: Secondary | ICD-10-CM

## 2023-11-27 ENCOUNTER — Encounter (HOSPITAL_COMMUNITY)

## 2023-11-29 ENCOUNTER — Encounter (HOSPITAL_COMMUNITY)
Admission: RE | Admit: 2023-11-29 | Discharge: 2023-11-29 | Disposition: A | Discharge status: Home / Self Care | Source: Ambulatory Visit | Attending: Cardiology | Admitting: Cardiology

## 2023-11-29 DIAGNOSIS — I2111 ST elevation (STEMI) myocardial infarction involving right coronary artery: Secondary | ICD-10-CM | POA: Diagnosis not present

## 2023-11-29 DIAGNOSIS — Z955 Presence of coronary angioplasty implant and graft: Secondary | ICD-10-CM

## 2023-12-01 ENCOUNTER — Telehealth (HOSPITAL_COMMUNITY): Payer: Self-pay

## 2023-12-01 ENCOUNTER — Encounter (HOSPITAL_COMMUNITY): Admission: RE | Admit: 2023-12-01 | Source: Ambulatory Visit

## 2023-12-01 NOTE — Telephone Encounter (Signed)
 Patient left message calling out sick for 10:15 CR class.

## 2023-12-04 ENCOUNTER — Encounter (HOSPITAL_COMMUNITY): Admission: RE | Admit: 2023-12-04 | Source: Ambulatory Visit

## 2023-12-04 ENCOUNTER — Telehealth (HOSPITAL_COMMUNITY): Payer: Self-pay

## 2023-12-04 NOTE — Telephone Encounter (Signed)
 Patient c/o for 10:15 CR class, left message stating he is feeling better and will be in class Wednesday.

## 2023-12-06 ENCOUNTER — Inpatient Hospital Stay (HOSPITAL_COMMUNITY): Admission: RE | Admit: 2023-12-06 | Source: Ambulatory Visit

## 2023-12-06 ENCOUNTER — Telehealth (HOSPITAL_COMMUNITY): Payer: Self-pay

## 2023-12-06 NOTE — Telephone Encounter (Signed)
 Patient c/o sick for 10:15 CR class, states when he is 48 hour symptom-free he would like to return to finish his classes as today was supposed to be his graduation.

## 2023-12-08 ENCOUNTER — Telehealth (HOSPITAL_COMMUNITY): Payer: Self-pay | Admitting: *Deleted

## 2023-12-08 NOTE — Telephone Encounter (Signed)
 Manuel Pearson left voice mail message this morning informing us  he is still sick so will be out today. Hoping will be better to return on Monday 9/29.

## 2023-12-11 ENCOUNTER — Encounter (HOSPITAL_COMMUNITY)
Admission: RE | Admit: 2023-12-11 | Discharge: 2023-12-11 | Disposition: A | Discharge status: Home / Self Care | Source: Ambulatory Visit | Attending: Cardiology | Admitting: Cardiology

## 2023-12-11 DIAGNOSIS — Z955 Presence of coronary angioplasty implant and graft: Secondary | ICD-10-CM

## 2023-12-11 DIAGNOSIS — I2111 ST elevation (STEMI) myocardial infarction involving right coronary artery: Secondary | ICD-10-CM | POA: Diagnosis not present

## 2023-12-13 ENCOUNTER — Encounter (HOSPITAL_COMMUNITY)
Admission: RE | Admit: 2023-12-13 | Discharge: 2023-12-13 | Disposition: A | Discharge status: Home / Self Care | Source: Ambulatory Visit | Attending: Cardiology | Admitting: Cardiology

## 2023-12-13 DIAGNOSIS — Z955 Presence of coronary angioplasty implant and graft: Secondary | ICD-10-CM | POA: Diagnosis present

## 2023-12-13 DIAGNOSIS — I2111 ST elevation (STEMI) myocardial infarction involving right coronary artery: Secondary | ICD-10-CM | POA: Diagnosis present

## 2023-12-15 ENCOUNTER — Encounter (HOSPITAL_COMMUNITY)
Admission: RE | Admit: 2023-12-15 | Discharge: 2023-12-15 | Disposition: A | Discharge status: Home / Self Care | Source: Ambulatory Visit | Attending: Cardiology | Admitting: Cardiology

## 2023-12-15 VITALS — BP 116/62 | HR 66 | Ht 67.75 in | Wt 218.3 lb

## 2023-12-15 DIAGNOSIS — I2111 ST elevation (STEMI) myocardial infarction involving right coronary artery: Secondary | ICD-10-CM

## 2023-12-15 DIAGNOSIS — Z955 Presence of coronary angioplasty implant and graft: Secondary | ICD-10-CM

## 2023-12-15 NOTE — Progress Notes (Signed)
 Discharge Progress Report  Patient Details  Name: Manuel Pearson MRN: [id number] Date of Birth: 02-Apr-1965 Referring Provider:   Flowsheet Row INTENSIVE CARDIAC REHAB ORIENT from 08/29/2023 in [facility]  Referring Provider Ladona Heinz, MD     Number of Visits: 9  Reason for Discharge:  Patient reached a stable level of exercise. Patient independent in their exercise. Patient has met program and personal goals.  Smoking History:  Social History   Tobacco Use  Smoking Status Every Day   Current packs/day: 0.50   Average packs/day: 0.5 packs/day for 40.0 years (20.0 ttl pk-yrs)   Types: Cigarettes  Smokeless Tobacco Never    Diagnosis:  07/03/23 STEMI  07/03/23 DES pRCA  ADL [facility]:   Initial Exercise Prescription:  Initial Exercise Prescription - 08/29/23 1400       Date of Initial Exercise RX and Referring Provider   Date 08/29/23    Referring Provider Ladona Heinz, MD    Expected Discharge Date 11/22/23      Recumbant Bike   Level 1    RPM 50    Watts 25    Minutes 15    METs 2.3      NuStep   Level 1    SPM 85    Minutes 15    METs 2.3      Prescription Details   Frequency (times per week) 3    Duration Progress to 30 minutes of continuous aerobic without signs/symptoms of physical distress      Intensity   THRR 40-80% of Max Heartrate 64-129    Ratings of Perceived Exertion 11-13    Perceived Dyspnea 0-4      Progression   Progression Continue to progress workloads to maintain intensity without signs/symptoms of physical distress.      Resistance Training   Training Prescription Yes    Weight 3 lbs    Reps 10-15          Discharge Exercise Prescription (Final Exercise Prescription Changes):  Exercise Prescription Changes - 12/11/23 1035       Response to Exercise   Blood Pressure (Admit) 132/70    Blood Pressure (Exit) 100/60    Heart Rate (Admit) 57 bpm    Heart Rate  (Exercise) 115 bpm    Heart Rate (Exit) 71 bpm    Rating of Perceived Exertion (Exercise) 14    Symptoms None    Comments Reviewed goals/ exercise plan with [name].    Duration Continue with 30 min of aerobic exercise without signs/symptoms of physical distress.    Intensity THRR unchanged      Progression   Progression Continue to progress workloads to maintain intensity without signs/symptoms of physical distress.    Average METs 5.3      Resistance Training   Training Prescription Yes    Weight 6 lbs    Reps 10-15    Time 5 Minutes      Interval Training   Interval Training No      Bike   Level 7.5    Watts 134    Minutes 15    METs 6      NuStep   Level 8    SPM 122    Minutes 15    METs 4.7      Home Exercise Plan   Plans to continue exercise at Home (comment)    Frequency Add 2 additional days to program exercise sessions.  Initial Home Exercises Provided 09/22/23          Functional Capacity:  6 Minute Walk     Row Name 08/29/23 1212 12/13/23 1054       6 Minute Walk   Phase Initial Discharge    Distance 1710 feet 2160 feet    Distance % Change -- 26.32 %    Distance Feet Change -- 450 ft    Walk Time 6 minutes 6 minutes    # of Rest Breaks 0 0    MPH 3.24 4.09    METS 3.73 4.78    RPE 15 14    Perceived Dyspnea  1 0    VO2 Peak 13.05 16.73    Symptoms Yes (comment) Yes (comment)    Comments Mild shortness of breath. Bilateral calf burning. Bilateral calf burning, 7/10 on pain scale. Resolved with rest.    Resting HR 70 bpm 58 bpm    Resting BP -- 126/70    Exercise Oxygen Saturation  during 6 min walk 96 % --    Max Ex. HR 82 bpm 98 bpm    Max Ex. BP 166/84 150/62    2 Minute Post BP 138/72 104/62       Psychological, QOL, Others - Outcomes: PHQ 2/9:    12/15/2023   10:39 AM 12/11/2023    5:00 PM 08/29/2023   11:16 AM 09/21/2014   12:12 PM 09/07/2014    9:46 AM  Depression screen PHQ 2/9  Decreased Interest 1  0 0 0  Down,  Depressed, Hopeless 0 1 0 0 0  PHQ - 2 Score 1 1 0 0 0  Altered sleeping 3 0 0    Tired, decreased energy 1 1 1     Change in appetite 0 1 1    Feeling bad or failure about yourself  0 0 0    Trouble concentrating 0 0 1    Moving slowly or fidgety/restless 0 0 0    Suicidal thoughts 0 0 0    PHQ-9 Score 5 3 3     Difficult doing work/chores  Not difficult at all Somewhat difficult      Quality of Life:  Quality of Life - 12/11/23 1704       Quality of Life   Select Quality of Life      Quality of Life Scores   Health/Function Pre 18.33 %    Health/Function Post 25.27 %    Health/Function % Change 37.86 %    Socioeconomic Pre 24.83 %    Socioeconomic Post 22.86 %    Socioeconomic % Change  -7.93 %    Psych/Spiritual Pre 17.5 %    Psych/Spiritual Post 24.64 %    Psych/Spiritual % Change 40.8 %    Family Pre 27.3 %    Family Post 28.8 %    Family % Change 5.49 %    GLOBAL Pre 20.7 %    GLOBAL Post 25.16 %    GLOBAL % Change 21.55 %          Personal Goals: Goals established at orientation with interventions provided to work toward goal.  Personal Goals and Risk Factors at Admission - 08/29/23 1144       Core Components/Risk Factors/Patient Goals on Admission    Weight Management Yes;Obesity;Weight Loss    Intervention Weight Management/Obesity: Establish reasonable short term and long term weight goals.;Obesity: Provide education and appropriate resources to help participant work on and attain dietary goals.  Admit Weight 238 lb 12.1 oz (108.3 kg)    Expected Outcomes Short Term: Continue to assess and modify interventions until short term weight is achieved;Long Term: Adherence to nutrition and physical activity/exercise program aimed toward attainment of established weight goal;Weight Loss: Understanding of general recommendations for a balanced deficit meal plan, which promotes 1-2 lb weight loss per week and includes a negative energy balance of [phone] kcal/d     Tobacco Cessation Yes    Number of packs per day 0.3    Intervention Assist the participant in steps to quit. Provide individualized education and counseling about committing to Tobacco Cessation, relapse prevention, and pharmacological support that can be provided by physician.;Education officer, environmental, assist with locating and accessing local/national Quit Smoking programs, and support quit date choice.    Expected Outcomes Short Term: Will demonstrate readiness to quit, by selecting a quit date.;Long Term: Complete abstinence from all tobacco products for at least 12 months from quit date.;Short Term: Will quit all tobacco product use, adhering to prevention of relapse plan.    Hypertension Yes    Intervention Provide education on lifestyle modifcations including regular physical activity/exercise, weight management, moderate sodium restriction and increased consumption of fresh fruit, vegetables, and low fat dairy, alcohol moderation, and smoking cessation.;Monitor prescription use compliance.    Expected Outcomes Short Term: Continued assessment and intervention until BP is < 140/68mm HG in hypertensive participants. < 130/18mm HG in hypertensive participants with diabetes, heart failure or chronic kidney disease.;Long Term: Maintenance of blood pressure at goal levels.    Lipids Yes    Intervention Provide education and support for participant on nutrition & aerobic/resistive exercise along with prescribed medications to achieve LDL 70mg , HDL >40mg .    Expected Outcomes Short Term: Participant states understanding of desired cholesterol values and is compliant with medications prescribed. Participant is following exercise prescription and nutrition guidelines.;Long Term: Cholesterol controlled with medications as prescribed, with individualized exercise RX and with personalized nutrition plan. Value goals: LDL < 70mg , HDL > 40 mg.    Stress Yes    Intervention Offer individual and/or small  group education and counseling on adjustment to heart disease, stress management and health-related lifestyle change. Teach and support self-help strategies.;Refer participants experiencing significant psychosocial distress to appropriate mental health specialists for further evaluation and treatment. When possible, include family members and significant others in education/counseling sessions.    Expected Outcomes Short Term: Participant demonstrates changes in health-related behavior, relaxation and other stress management skills, ability to obtain effective social support, and compliance with psychotropic medications if prescribed.;Long Term: Emotional wellbeing is indicated by absence of clinically significant psychosocial distress or social isolation.           Personal Goals Discharge:  Goals and Risk Factor Review     Row Name 09/19/23 1529 10/24/23 1532 11/20/23 1753 12/13/23 1136       Core Components/Risk Factors/Patient Goals Review   Personal Goals Review Weight Management/Obesity;Lipids;Hypertension;Stress;Tobacco Cessation Weight Management/Obesity;Lipids;Hypertension;Stress;Tobacco Cessation Weight Management/Obesity;Lipids;Hypertension;Stress;Tobacco Cessation Weight Management/Obesity;Lipids;Hypertension;Stress;Tobacco Cessation    Review [name] has been doing well with exercise at cardiac rehab. vital signs have been stable. [name] has lost 2.3 kg since starting cardiac rehab. [name]  do well with exercise at cardiac rehab. vital signs have been stable. [name] has lost 5.6  kg since starting cardiac rehab. [name] has increased his workloads. Will continue to monitor heart rate as beta blocker was chnaged. [name]  continues to do  well with exercise at cardiac rehab. vital signs remain stable. [name] has  lost 7.3   kg since starting cardiac rehab. [name] has increased his workloads. Will continue to monitor heart rate as beta blocker was chnaged. [name] is making excellent progress with  exercise. He is also following the [facility] nutrition plan. He has lost 20.5 lbs since orientation on 08/29/23. He has an outdoor bike and plans to ride it 3 days/week and resistance training the days he doesn't ride his bike.    Expected Outcomes [name] will continue to participate in exercise at cardiac rehab for exercise, nutrition and lifestyle modifications. [name] will continue to participate in exercise at cardiac rehab for exercise, nutrition and lifestyle modifications. [name] will continue to participate in exercise at cardiac rehab for exercise, nutrition and lifestyle modifications. [name] will continue home exercise routine and nutrition changes upon completion of the cardiac rehab program to help maintain health and fitness gains.       Exercise Goals and Review:  Exercise Goals     Row Name 08/29/23 1114             Exercise Goals   Increase Physical Activity Yes       Intervention Provide advice, education, support and counseling about physical activity/exercise needs.;Develop an individualized exercise prescription for aerobic and resistive training based on initial evaluation findings, risk stratification, comorbidities and participant's personal goals.       Expected Outcomes Short Term: Attend rehab on a regular basis to increase amount of physical activity.;Long Term: Exercising regularly at least 3-5 days a week.;Long Term: Add in home exercise to make exercise part of routine and to increase amount of physical activity.       Increase Strength and Stamina Yes       Intervention Provide advice, education, support and counseling about physical activity/exercise needs.;Develop an individualized exercise prescription for aerobic and resistive training based on initial evaluation findings, risk stratification, comorbidities and participant's personal goals.       Expected Outcomes Short Term: Increase workloads from initial exercise prescription for resistance, speed, and METs.;Short  Term: Perform resistance training exercises routinely during rehab and add in resistance training at home;Long Term: Improve cardiorespiratory fitness, muscular endurance and strength as measured by increased METs and functional capacity ( )       Able to understand and use rate of perceived exertion (RPE) scale Yes       Intervention Provide education and explanation on how to use RPE scale       Expected Outcomes Short Term: Able to use RPE daily in rehab to express subjective intensity level;Long Term:  Able to use RPE to guide intensity level when exercising independently       Knowledge and understanding of Target Heart Rate Range (THRR) Yes       Intervention Provide education and explanation of THRR including how the numbers were predicted and where they are located for reference       Expected Outcomes Short Term: Able to state/look up THRR;Long Term: Able to use THRR to govern intensity when exercising independently;Short Term: Able to use daily as guideline for intensity in rehab       Able to check pulse independently Yes       Intervention Provide education and demonstration on how to check pulse in carotid and radial arteries.;Review the importance of being able to check your own pulse for safety during independent exercise       Expected Outcomes Short Term: Able to explain why pulse checking is important during independent exercise;Long Term: Able to  check pulse independently and accurately       Understanding of Exercise Prescription Yes       Intervention Provide education, explanation, and written materials on patient's individual exercise prescription       Expected Outcomes Short Term: Able to explain program exercise prescription;Long Term: Able to explain home exercise prescription to exercise independently          Exercise Goals Re-Evaluation:  Exercise Goals Re-Evaluation     Row Name 09/04/23 1136 09/22/23 1103 10/18/23 1057 11/01/23 1046 12/08/23 1555     Exercise  Goal Re-Evaluation   Exercise Goals Review Increase Physical Activity;Increase Strength and Stamina;Able to understand and use rate of perceived exertion (RPE) scale Increase Physical Activity;Increase Strength and Stamina;Able to understand and use rate of perceived exertion (RPE) scale;Knowledge and understanding of Target Heart Rate Range (THRR);Able to check pulse independently;Understanding of Exercise Prescription Increase Physical Activity;Increase Strength and Stamina;Able to understand and use rate of perceived exertion (RPE) scale;Knowledge and understanding of Target Heart Rate Range (THRR);Able to check pulse independently;Understanding of Exercise Prescription Increase Physical Activity;Increase Strength and Stamina;Able to understand and use rate of perceived exertion (RPE) scale;Knowledge and understanding of Target Heart Rate Range (THRR);Able to check pulse independently;Understanding of Exercise Prescription Increase Physical Activity;Increase Strength and Stamina;Able to understand and use rate of perceived exertion (RPE) scale;Knowledge and understanding of Target Heart Rate Range (THRR);Able to check pulse independently;Understanding of Exercise Prescription   Comments [name] was able to understand and use RPE scale appropriately. Reviewed exercise prescription with [name]. He is not currently walking for exercise because he has plates in his lower leg/ankle which cause soreness/ pain/ numbness. Discussed seated stretches as an alternative to weight bearing exercise that bothers his leg. Handout given on seated aerobic exercise that can be done at home. [name] continues to make great progess with exercise. He's currently achieving 5.0 METs with exercise. He will increase hand weights from 5  to 6 lbs next session. [name] is progressing workloads appropriately. He wants to buy a bike to ride at home, so he's trying to build his stamina and get comfortable riding by increasing his workloads on the  bike. [name] has been absent since 11/29/23 due to illness. Will extend graduation date to accomodate missed session.   Expected Outcomes Progress workloads as tolerated to help increase strength and endurance. Continue to progress workloads. Introduce seated aerobic exercise at home 1-2 days/week at home. Continue to progess workloads to increase strength and stamina. [name] will buy a bike and add outdoor biking to his home exercise routine. Complete walk test and goals review upon return to the program.    Row Name 12/11/23 1115             Exercise Goal Re-Evaluation   Exercise Goals Review Increase Physical Activity;Increase Strength and Stamina;Able to understand and use rate of perceived exertion (RPE) scale;Knowledge and understanding of Target Heart Rate Range (THRR);Able to check pulse independently;Understanding of Exercise Prescription       Comments [name] returned to exercise well and tolerated without any issues. He will complete cardiac rehab this week and plans to continue biking 4-5 miles 3 days week. He has a [employer]flexx and free weights that he plans to use for his resistance training on the days he's not biking. He has lost 20 lbs since orientation in June.       Expected Outcomes [name] will continue aerobic and resistance training upon completion of the cardiac rehab program to maintain health and fitness  gains.          Nutrition & Weight - Outcomes:  Pre Biometrics - 08/29/23 1030       Pre Biometrics   Waist Circumference 47 inches    Hip Circumference 45 inches    Waist to Hip Ratio 1.04 %    Triceps Skinfold 20 mm    % Body Fat 35.1 %    Grip Strength 29 kg    Flexibility --   Not performed. Mulitple back surgery, chronic pain.   Single Leg Stand 30 seconds          Post Biometrics - 12/13/23 1105        Post  Biometrics   Waist Circumference 43.75 inches    Hip Circumference 43 inches    Waist to Hip Ratio 1.02 %    Triceps Skinfold 14 mm    Grip Strength  30 kg    Flexibility --   Not performed. Mulitple back surgery, chronic pain.   Single Leg Stand 26.06 seconds          Nutrition:  Nutrition Therapy & Goals - 11/06/23 1124       Nutrition Therapy   Diet Heart Healthy Diet    Drug/Food Interactions Statins/Certain Fruits      Personal Nutrition Goals   Nutrition Goal Patient to identify strategies for reducing cardiovascular risk by attending the [facility] education and nutrition series weekly   goal in progress.   Personal Goal #2 Patient to improve diet quality by using the plate method as a guide for meal planning to include lean protein/plant protein, fruits, vegetables, whole grains, nonfat dairy as part of a well-balanced diet.   goal in progress.   Comments Goals in action. 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 He does take salt tablets due to chronic hyponatremia; patient reports this is caused by his seizure medications. He reports making some dietary changes including reduced processed foods, decreased simple sugars, and increased lean proteins. A1c is in a prediabetic range. LDL is at goal. He is down 16# since starting with our program. He continues to work on smoking cessation. Patient will benefit from participation in intensive cardiac rehab for nutrition, exercise, and lifestyle modification.      Intervention Plan   Intervention Prescribe, educate and counsel regarding individualized specific dietary modifications aiming towards targeted core components such as weight, hypertension, lipid management, diabetes, heart failure and other comorbidities.;Nutrition handout(s) given to patient.    Expected Outcomes Short Term Goal: Understand basic principles of dietary content, such as calories, fat, sodium, cholesterol and nutrients.;Long Term Goal: Adherence to prescribed nutrition plan.           Nutrition Discharge:  Nutrition Assessments - 12/11/23 1705       Rate Your Plate Scores   Pre Score 50          Education Questionnaire Score:  Knowledge Questionnaire Score - 12/11/23 1705       Knowledge Questionnaire Score   Pre Score 27/28    Post Score 27/28         Pt graduates from  Intensive/Traditional cardiac rehab program today with completion of 61 exercise and education sessions. Pt maintained good attendance and progressed nicely during their participation in rehab as evidenced by increased MET level.   Medication list reconciled. Repeat  PHQ score-5.  Pt has made significant lifestyle changes and should  be commended for his success.  [name] achieved his goals during cardiac rehab.   Pt plans to continue exercise by biking and lifting weights.  Goals reviewed with patient; copy given to patient.
